# Patient Record
Sex: Female | Born: 1955
Health system: Southern US, Community
[De-identification: ages and names within clinical notes are randomized; demographics above are authoritative.]

## PROBLEM LIST (undated history)

## (undated) DIAGNOSIS — I10 Essential (primary) hypertension: Secondary | ICD-10-CM

## (undated) DIAGNOSIS — N6092 Unspecified benign mammary dysplasia of left breast: Secondary | ICD-10-CM

## (undated) DIAGNOSIS — M8580 Other specified disorders of bone density and structure, unspecified site: Secondary | ICD-10-CM

## (undated) DIAGNOSIS — K760 Fatty (change of) liver, not elsewhere classified: Secondary | ICD-10-CM

## (undated) DIAGNOSIS — N952 Postmenopausal atrophic vaginitis: Secondary | ICD-10-CM

## (undated) HISTORY — DX: Essential (primary) hypertension: I10

## (undated) HISTORY — DX: Fatty (change of) liver, not elsewhere classified: K76.0

## (undated) HISTORY — DX: Unspecified benign mammary dysplasia of left breast: N60.92

## (undated) HISTORY — PX: OTHER SURGICAL HISTORY: SHX169

## (undated) HISTORY — PX: COLONOSCOPY: SHX174

## (undated) HISTORY — DX: Postmenopausal atrophic vaginitis: N95.2

## (undated) HISTORY — DX: Other specified disorders of bone density and structure, unspecified site: M85.80

---
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
## 2005-11-15 HISTORY — PX: BREAST BIOPSY: SHX20

## 2009-04-03 LAB — HM MAMMOGRAPHY

## 2013-04-08 HISTORY — PX: BREAST EXCISIONAL BIOPSY: SUR124

## 2013-05-17 ENCOUNTER — Ambulatory Visit: Payer: Self-pay | Admitting: Family Medicine

## 2013-05-24 LAB — TSH: TSH: 3 u[IU]/mL (ref <=5.90)

## 2013-05-25 ENCOUNTER — Ambulatory Visit: Payer: Self-pay | Admitting: General Surgery

## 2013-06-16 ENCOUNTER — Encounter: Payer: Self-pay | Admitting: General Surgery

## 2013-06-16 ENCOUNTER — Ambulatory Visit (INDEPENDENT_AMBULATORY_CARE_PROVIDER_SITE_OTHER): Payer: Federal, State, Local not specified - PPO | Admitting: General Surgery

## 2013-06-16 VITALS — BP 130/78 | HR 74 | Resp 14 | Ht 63.0 in | Wt 144.0 lb

## 2013-06-16 DIAGNOSIS — R928 Other abnormal and inconclusive findings on diagnostic imaging of breast: Secondary | ICD-10-CM

## 2013-06-16 DIAGNOSIS — R921 Mammographic calcification found on diagnostic imaging of breast: Secondary | ICD-10-CM

## 2013-06-16 DIAGNOSIS — N63 Unspecified lump in unspecified breast: Secondary | ICD-10-CM

## 2013-06-16 NOTE — Patient Instructions (Addendum)
Patient to be scheduled for a left breast biopsy.  Continue self breast exams. Call office for any new breast issues or concerns.   Stereotactic Breast Biopsy  A stereotactic breast biopsy is a procedure in which mammography is used in the collection of a sample of breast tissue. Mammography is a type of X-ray exam of the breasts that produces an image called a mammogram. The mammogram allows your health care provider to precisely locate the area of the breast from which a tissue sample will be taken. The tissue is then examined under a microscope to see if cancerous cells are present. A breast biopsy is done when:   A lump, abnormality, or mass is seen in the breast on a breast X-ray (mammogram).   Small calcium deposits (calcifications) are seen in the breast.   The shape or appearance of the breasts changes.   The shape or appearance of the nipples changes. You may have unusual or bloody discharge coming from the nipples, or you may have crusting, retraction, or dimpling of the nipples. A breast biopsy can indicate if you need surgery or other treatment.  LET [facility] CARE PROVIDER KNOW ABOUT:  Any allergies you have.  All medicines you are taking, including vitamins, herbs, eye drops, creams, and over-the-counter medicines.  Previous problems you or members of your family have had with the use of anesthetics.  Any blood disorders you have.  Previous surgeries you have had.  Medical conditions you have. RISKS AND COMPLICATIONS Generally, stereotactic breast biopsy is a safe procedure. However, as with any procedure, complications can occur. Possible complications include:  Infection at the needle-insertion site.   Bleeding or bruising after surgery.  The breast may become altered or deformed as a result of the procedure.  The needle may go through the chest wall into the lung area.  BEFORE THE PROCEDURE  Wear a supportive bra to the procedure.  You will be asked to  remove jewelry, dentures, eyeglasses, metal objects, or clothing that might interfere with the X-ray images. You may want to leave some of these objects at home.  Arrange for someone to drive you home after the procedure if desired. PROCEDURE  A stereotactic breast biopsy is done while you are awake. During the procedure, relax as much as possible. Let your health care provider know if you are uncomfortable, anxious, or in pain. Usually, the only discomfort felt during the procedure is caused by staying in one position for the length of the procedure. This discomfort can be reduced by carefully placed cushions. Most of the time the biopsy is done using a table with openings on it. You will be asked to lie facedown on the table and place your breasts through the openings. Your breast is compressed between metal plates to get good X-ray images. Your skin will be cleaned, and a numbing medicine (local anesthetic) will be injected. A small cut (incision) will be made in your breast. The tip of the biopsy needle will be directed through the incision. Several small pieces of suspicious tissue will be taken. Then, a final set of X-ray images will be obtained. If they show that the suspicious tissue has been mostly or completely removed, a small clip will be left at the biopsy site. This is done so that the biopsy site can be easily located if the results of the biopsy show that the tissue is cancerous.  After the procedure, the incision will be stitched (sutured) or taped and covered with a  bandage (dressing). Your health care provider may apply a pressure dressing and an ice pack to prevent bleeding and swelling in the breast.  A stereotactic breast biopsy can take 30 minutes or more. AFTER THE PROCEDURE  If you are doing well and have no problems, you will be allowed to go home.  Document Released: 12/22/2002 Document Revised: 11/25/2012 Document Reviewed: 10/22/2012 [facility] Patient Information 2014  [place], [place].  Patient has been scheduled for a left breast stereotactic biopsy at [facility] for 06-21-13 at 4 pm. She will check-in at the [facility] at 3:30 pm. This patient is aware of date, time, and instructions. Patient verbalizes understanding.
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
## 2013-06-19 DIAGNOSIS — R921 Mammographic calcification found on diagnostic imaging of breast: Secondary | ICD-10-CM | POA: Insufficient documentation

## 2013-06-19 DIAGNOSIS — N63 Unspecified lump in unspecified breast: Secondary | ICD-10-CM | POA: Insufficient documentation

## 2013-06-21 ENCOUNTER — Ambulatory Visit: Payer: Self-pay | Admitting: General Surgery

## 2013-06-21 DIAGNOSIS — N63 Unspecified lump in unspecified breast: Secondary | ICD-10-CM

## 2013-06-21 DIAGNOSIS — R920 Mammographic microcalcification found on diagnostic imaging of breast: Secondary | ICD-10-CM

## 2013-06-23 ENCOUNTER — Telehealth: Payer: Self-pay | Admitting: General Surgery

## 2013-06-23 LAB — PATHOLOGY REPORT

## 2013-06-23 NOTE — Telephone Encounter (Signed)
Notified of biopsy results. Nodule suspected as papilloma with some focal atypia. As peripheral papillomas can harbor more aggressive lesions, surgical excision of the area has been recommended.  The area of diffuse calcifications suggested an intraductal papilloma with no atypia.  Patient reports moderate pain yesterday, markedly benefited by ice application. Less discomfort today.  Will arrange for brief OV and excision in the near future.

## 2013-06-24 ENCOUNTER — Encounter: Payer: Self-pay | Admitting: General Surgery

## 2013-06-24 ENCOUNTER — Telehealth: Payer: Self-pay | Admitting: *Deleted

## 2013-06-24 NOTE — Telephone Encounter (Signed)
Message copied by Dominga Ferry on Thu Jun 24, 2013  9:41 AM ------      Message from: Stuart, Forest Gleason      Created: Wed Jun 23, 2013  5:48 PM       Please contact patient to arrange for a brief OV to review biopsy results and schedule her for an open left breast biopsy when convenient for her. Thanks. ------

## 2013-06-24 NOTE — Telephone Encounter (Signed)
Patient's surgery has been scheduled for 07-02-13 at [facility] She will come in for a pre-op visit on 06-30-13 (she was offered Monday,06-28-13, but could not come that day). Phone interview scheduled for 07-01-13.

## 2013-06-28 ENCOUNTER — Ambulatory Visit: Payer: Self-pay | Admitting: Gastroenterology

## 2013-06-28 LAB — HM COLONOSCOPY

## 2013-06-29 ENCOUNTER — Ambulatory Visit: Payer: Federal, State, Local not specified - PPO

## 2013-06-30 ENCOUNTER — Encounter: Payer: Self-pay | Admitting: General Surgery

## 2013-06-30 ENCOUNTER — Ambulatory Visit (INDEPENDENT_AMBULATORY_CARE_PROVIDER_SITE_OTHER): Payer: Federal, State, Local not specified - PPO | Admitting: General Surgery

## 2013-06-30 ENCOUNTER — Other Ambulatory Visit: Payer: Federal, State, Local not specified - PPO

## 2013-06-30 VITALS — BP 136/82 | HR 80 | Resp 12 | Ht 63.0 in | Wt 140.0 lb

## 2013-06-30 DIAGNOSIS — N63 Unspecified lump in unspecified breast: Secondary | ICD-10-CM

## 2013-06-30 DIAGNOSIS — D249 Benign neoplasm of unspecified breast: Secondary | ICD-10-CM

## 2013-06-30 NOTE — Patient Instructions (Signed)
Scheduled biopsy on 07/01/13 at [facility]

## 2013-06-30 NOTE — Progress Notes (Signed)
Patient ID: Alyssa Thornton, female   DOB: [birth year], 58 y.o.   MRN: [id number]  Chief Complaint  Patient presents with  . Pre-op Exam    left breast biopsy    HPI Alyssa Thornton is a 58 y.o. female here today for her pre op left breast biopsy scheduled for 07/02/13. The patient had undergone stereotactic biopsy of a new lead identified nodular area in the upper-outer quadrant of the left breast as well as a area of increased calcifications in the upper-outer quadrant, central area of the breast. The former showed papillary neoplasm with atypia, the latter showed papillary changes without atypia. Excision has been recommended to confirm no additional pathology is overlooked.  The patient reports much less bruising with her recent stereotactic biopsy than in the past. HPI  Past Medical History  Diagnosis Date  . Fatty liver   . Breast mass   . Hypertension     Past Surgical History  Procedure Laterality Date  . Breast biopsy Left 2007  . Colonoscopy  2007-2008?    Family History  Problem Relation Age of Onset  . Heart disease Father   . Cancer Sister     bone marrow cancer    Social History History  Substance Use Topics  . Smoking status: Never Smoker   . Smokeless tobacco: Not on file  . Alcohol Use: No    Allergies  Allergen Reactions  . Diclofenac Sodium Diarrhea  . Tramadol Diarrhea    Current Outpatient Prescriptions  Medication Sig Dispense Refill  . Cholecalciferol (VITAMIN D-3 PO) Take 4,000 Int'l Units by mouth 4 (four) times daily.      . fluticasone (FLONASE) 50 MCG/ACT nasal spray Place 1 spray into both nostrils daily as needed.      . metroNIDAZOLE (METROGEL) 1 % gel Apply 1 application topically daily.      . Multiple Vitamin (MULTIVITAMIN) tablet Take 1 tablet by mouth daily.      Marland Kitchen PREMARIN vaginal cream Place 1 Applicatorful vaginally 2 (two) times a week.      . quinapril-hydrochlorothiazide (ACCURETIC) 10-12.5 MG per tablet Take 1 tablet by mouth  daily.      . vitamin C (ASCORBIC ACID) 500 MG tablet Take 500 mg by mouth daily.       No current facility-administered medications for this visit.    Review of Systems Review of Systems  Constitutional: Negative.   Respiratory: Negative.   Cardiovascular: Negative.     Blood pressure 136/82, pulse 80, resp. rate 12, height 5\' 3"  (1.6 m), weight 140 lb (63.504 kg).  Physical Exam Physical Exam  Constitutional: She is oriented to person, place, and time. She appears well-developed and well-nourished.  Eyes: Conjunctivae are normal.  Neck: Neck supple.  Cardiovascular: Normal rate, regular rhythm and normal heart sounds.   Pulmonary/Chest: Effort normal and breath sounds normal.    Lymphadenopathy:    She has no cervical adenopathy.  Neurological: She is alert and oriented to person, place, and time.  Skin: Skin is warm and dry.    Data Reviewed The nodular area in the upper outer quadrant of the left breast showed a papillary neoplasm with atypia. The area diffuse calcification showed papillary neoplasm without atypia. Ductal hyperplasia and sclerosing adenosis was also identified.  Ultrasound examination of the breast showed the biopsy cavities to be poorly visualized. At the 12:00 position 8 cm from the nipple the area of calcifications biopsied appears to be a 0.4 x 0.6 x 0.6 cm  hypoechoic area with dense shadowing.  The area of nodular excision in the upper outer quadrant the breast may be represented by a hypoechoic area at the 1:00 position, 12 cm from the nipple measuring 0.5 x 1.0 x 1.0 cm. These are fairly ill-defined.  Review of the post biopsy mammogram shows the clips previously placed (TOPHAT for the nodule, cylinder for the calcifications) to be about 2.5 cm apart. These are in the posterior third of the breast. To minimize volume resection, needle localization will be of benefit   Assessment    Papilloma with atypia left breast.    Plan    Indications for  wide excision to confirm no additional pathology was reviewed. We'll proceed with plans for surgery on 07/02/2048    PCP: Etheleen Mayhew 07/01/2013, 10:04 AM

## 2013-07-01 ENCOUNTER — Other Ambulatory Visit: Payer: Self-pay | Admitting: General Surgery

## 2013-07-01 DIAGNOSIS — D249 Benign neoplasm of unspecified breast: Secondary | ICD-10-CM | POA: Insufficient documentation

## 2013-07-01 DIAGNOSIS — N63 Unspecified lump in unspecified breast: Secondary | ICD-10-CM

## 2013-07-02 ENCOUNTER — Ambulatory Visit: Payer: Self-pay | Admitting: General Surgery

## 2013-07-02 DIAGNOSIS — N6092 Unspecified benign mammary dysplasia of left breast: Secondary | ICD-10-CM

## 2013-07-02 DIAGNOSIS — N6089 Other benign mammary dysplasias of unspecified breast: Secondary | ICD-10-CM

## 2013-07-02 HISTORY — DX: Unspecified benign mammary dysplasia of left breast: N60.92

## 2013-07-02 HISTORY — PX: BREAST BIOPSY: SHX20

## 2013-07-05 ENCOUNTER — Encounter: Payer: Self-pay | Admitting: General Surgery

## 2013-07-06 ENCOUNTER — Ambulatory Visit: Payer: Self-pay | Admitting: General Surgery

## 2013-07-07 ENCOUNTER — Telehealth: Payer: Self-pay | Admitting: *Deleted

## 2013-07-07 LAB — PATHOLOGY REPORT

## 2013-07-07 NOTE — Telephone Encounter (Signed)
Message copied by Carson Myrtle on Wed Jul 07, 2013  4:01 PM ------      Message from: [place], Forest Gleason      Created: Wed Jul 07, 2013  2:56 PM       Please let the patient know that the final report was fine. No cancer.  Will review in detail at follow up appt. Thanks. ------

## 2013-07-07 NOTE — Telephone Encounter (Signed)
Notified patient as instructed, patient pleased. Discussed follow-up appointments, 07-08-13, patient agrees

## 2013-07-08 ENCOUNTER — Encounter: Payer: Self-pay | Admitting: General Surgery

## 2013-07-08 ENCOUNTER — Ambulatory Visit: Payer: Federal, State, Local not specified - PPO | Admitting: General Surgery

## 2013-07-08 ENCOUNTER — Ambulatory Visit (INDEPENDENT_AMBULATORY_CARE_PROVIDER_SITE_OTHER): Payer: Federal, State, Local not specified - PPO | Admitting: General Surgery

## 2013-07-08 VITALS — BP 128/70 | HR 68 | Resp 12 | Ht 63.0 in | Wt 140.0 lb

## 2013-07-08 DIAGNOSIS — D249 Benign neoplasm of unspecified breast: Secondary | ICD-10-CM

## 2013-07-08 DIAGNOSIS — N62 Hypertrophy of breast: Secondary | ICD-10-CM

## 2013-07-08 DIAGNOSIS — N6092 Unspecified benign mammary dysplasia of left breast: Secondary | ICD-10-CM

## 2013-07-08 NOTE — Progress Notes (Signed)
Patient ID: Alyssa Thornton, female   DOB: October 20, 1955, 58 y.o.   MRN: [id number]  Chief Complaint  Patient presents with  . Routine Post Op    left breast open biopsy    HPI Alyssa Thornton is a 58 y.o. female.  Here today for follow up left breast open biopsy done 07-02-13. States she is still "sore".   HPI  Past Medical History  Diagnosis Date  . Fatty liver   . Breast mass   . Hypertension   . Atypical hyperplasia of left breast July 02, 2013    Left breast papilloma    Past Surgical History  Procedure Laterality Date  . Colonoscopy  2007-2008, 2015    Dr Taevin Mcferran/Dr Allen Norris  . Breast biopsy Left 2007  . Breast biopsy Left July 02, 2013    left breast papilloma with focal atypia    Family History  Problem Relation Age of Onset  . Heart disease Father   . Cancer Sister     bone marrow cancer  . Cancer - Ovarian Maternal Grandmother     90    Social History History  Substance Use Topics  . Smoking status: Never Smoker   . Smokeless tobacco: Not on file  . Alcohol Use: No    Allergies  Allergen Reactions  . Diclofenac Sodium Diarrhea  . Tramadol Diarrhea    Current Outpatient Prescriptions  Medication Sig Dispense Refill  . Cholecalciferol (VITAMIN D-3 PO) Take 4,000 Int'l Units by mouth 4 (four) times daily.      . fluticasone (FLONASE) 50 MCG/ACT nasal spray Place 1 spray into both nostrils daily as needed.      . metroNIDAZOLE (METROGEL) 1 % gel Apply 1 application topically daily.      . Multiple Vitamin (MULTIVITAMIN) tablet Take 1 tablet by mouth daily.      Marland Kitchen PREMARIN vaginal cream Place 1 Applicatorful vaginally 2 (two) times a week.      . quinapril-hydrochlorothiazide (ACCURETIC) 10-12.5 MG per tablet Take 1 tablet by mouth daily.      . vitamin C (ASCORBIC ACID) 500 MG tablet Take 500 mg by mouth daily.       No current facility-administered medications for this visit.    Review of Systems Review of Systems  Blood pressure 128/70, pulse 68,  resp. rate 12, height 5\' 3"  (1.6 m), weight 140 lb (63.504 kg).  Physical Exam Physical Exam  Constitutional: She is oriented to person, place, and time. She appears well-developed and well-nourished.  Pulmonary/Chest:  Incision left breast healing well with some bruising  Neurological: She is alert and oriented to person, place, and time.  Skin: Skin is warm and dry.    Data Reviewed Complex sclerosing lesion corresponded to area of calcifications in the breast anterior and medial to the nodular area which showed a papilloma with focal atypia. All margins clear.   Baker Janus model risk calculation: 4.1% risk in 5 years, 20.6% risk lifetime. Candidate for chemoprevention.  Assessment    Left breast papilloma with focal atypia, resected to negative margins.     Plan    The patient is doing well with healing in showing good volume preservation of the left breast.  Indication for antiestrogen therapy was reviewed. She reports that she was diagnosed with fatty liver for which evaluation was completed by Alyssa Thornton, M.D. Earlier this year. We'll need to review her records to determine if this is a contraindication to tamoxifen therapy. She'll be contacted with this review  is complete.         PCP: Etheleen Mayhew 07/10/2013, 6:28 AM

## 2013-07-08 NOTE — Patient Instructions (Signed)
Continue self breast exams. Call office for any new breast issues or concerns. 

## 2013-07-10 ENCOUNTER — Encounter: Payer: Self-pay | Admitting: General Surgery

## 2013-07-10 DIAGNOSIS — N6092 Unspecified benign mammary dysplasia of left breast: Secondary | ICD-10-CM | POA: Insufficient documentation

## 2013-07-20 ENCOUNTER — Ambulatory Visit: Payer: Federal, State, Local not specified - PPO | Admitting: General Surgery

## 2013-07-29 ENCOUNTER — Encounter: Payer: Self-pay | Admitting: General Surgery

## 2013-08-16 ENCOUNTER — Ambulatory Visit (INDEPENDENT_AMBULATORY_CARE_PROVIDER_SITE_OTHER): Payer: Federal, State, Local not specified - PPO | Admitting: General Surgery

## 2013-08-16 ENCOUNTER — Encounter: Payer: Self-pay | Admitting: General Surgery

## 2013-08-16 VITALS — BP 120/68 | HR 70 | Resp 12 | Ht 63.0 in | Wt 136.0 lb

## 2013-08-16 DIAGNOSIS — D249 Benign neoplasm of unspecified breast: Secondary | ICD-10-CM

## 2013-08-16 DIAGNOSIS — K7689 Other specified diseases of liver: Secondary | ICD-10-CM

## 2013-08-16 MED ORDER — TAMOXIFEN CITRATE 20 MG PO TABS: 20.0000 mg | ORAL_TABLET | Freq: Every day | ORAL | Status: DC

## 2013-08-16 NOTE — Patient Instructions (Signed)
Patient to take a baby aspiration daily. Discussed Tamoxifen. Patient to return in 3 months. Patient to call us in a month to let us know how you are doing on the  Tamoxifen.

## 2013-08-16 NOTE — Progress Notes (Signed)
Patient ID: Alyssa Thornton, female   DOB: 01-05-1956, 58 y.o.   MRN: [id number]  Chief Complaint  Patient presents with  . Follow-up    breast biopsy    HPI Alyssa Thornton is a 58 y.o. Here today for follow up left breast open biopsy done 07-02-13. Patient states she is doing well.   HPI  Past Medical History  Diagnosis Date  . Fatty liver   . Breast mass   . Hypertension   . Atypical hyperplasia of left breast July 02, 2013    Left breast papilloma    Past Surgical History  Procedure Laterality Date  . Colonoscopy  2007-2008, 2015    Dr Alyssa Thornton/Dr Alyssa Thornton  . Breast biopsy Left 2007  . Breast biopsy Left July 02, 2013    left breast papilloma with focal atypia    Family History  Problem Relation Age of Onset  . Heart disease Father   . Cancer Sister     bone marrow cancer  . Cancer - Ovarian Maternal Grandmother     90    Social History History  Substance Use Topics  . Smoking status: Never Smoker   . Smokeless tobacco: Not on file  . Alcohol Use: No    Allergies  Allergen Reactions  . Diclofenac Sodium Diarrhea  . Tramadol Diarrhea    Current Outpatient Prescriptions  Medication Sig Dispense Refill  . Cholecalciferol (VITAMIN D-3 PO) Take 4,000 Int'l Units by mouth 4 (four) times daily.      . fluticasone (FLONASE) 50 MCG/ACT nasal spray Place 1 spray into both nostrils daily as needed.      . metroNIDAZOLE (METROGEL) 1 % gel Apply 1 application topically daily.      . Multiple Vitamin (MULTIVITAMIN) tablet Take 1 tablet by mouth daily.      Marland Kitchen PREMARIN vaginal cream Place 1 Applicatorful vaginally 2 (two) times a week.      . quinapril-hydrochlorothiazide (ACCURETIC) 10-12.5 MG per tablet Take 1 tablet by mouth daily.      . vitamin C (ASCORBIC ACID) 500 MG tablet Take 500 mg by mouth daily.      . tamoxifen (NOLVADEX) 20 MG tablet Take 1 tablet (20 mg total) by mouth daily.  30 tablet  11   No current facility-administered medications for this visit.     Review of Systems Review of Systems  Constitutional: Negative.   Respiratory: Negative.   Cardiovascular: Negative.     Blood pressure 120/68, pulse 70, resp. rate 12, height 5\' 3"  (1.6 m), weight 136 lb (61.689 kg).  Physical Exam Physical Exam  Constitutional: She is oriented to person, place, and time. She appears well-developed and well-nourished.  Eyes: Conjunctivae are normal.  Neck: Neck supple.  Cardiovascular: Normal rate, regular rhythm and normal heart sounds.   Pulmonary/Chest: Right breast exhibits no inverted nipple, no mass, no nipple discharge, no skin change and no tenderness. Left breast exhibits no inverted nipple, no mass, no nipple discharge, no skin change and no tenderness.  Left breast incision well healed in the upper outer quadrant.  There is scant palpable deformity in this area in spite of the 4 x 4 by 6 cm volume of tissue removed. Left nipple height is well preserved compared to the right. Perhaps 5 of lateral deviation of the nipple areolar complex on the left, notable only with the patient's attention to the area.  Lymphadenopathy:    She has no cervical adenopathy.    She has no axillary  adenopathy.  Neurological: She is alert and oriented to person, place, and time.  Skin: Skin is warm and dry.    Data Reviewed July 02, 2013: Complex sclerosing lesion corresponded to area of calcifications in the breast anterior and medial to the nodular area which showed a papilloma with focal atypia. All margins clear. Discussed w/ Dr. Luana Thornton from pathology.   Assessment    Breast atypia, candidate for chemotherapy prevention.     Plan    The patient's history of "fatty liver" was discussed informally [name], M.D. From gastroenterology. He saw no frank contraindication to the institution of tamoxifen therapy, but recommended a liver panel 6 weeks after initiation of therapy.  Indications for chemotherapy prevention was reviewed with the patient.  Risks associated with medications tamoxifen including DVT, uterine malignancy and vasomotor symptoms were reviewed.  The need to make use of the medication for least one month to determine her tolerance was emphasized.   She'll give a phone follow up in one month and liver function studies will be obtained in 6 weeks at the lack or facility most convenient for her.  We'll plan on a clinical exam in 6 months due to her concerns about breast symmetry, volume loss in the upper outer quadrant as well as slight lateral deviation of the nipple-areolar complex and loss of the left nipple height.   The possibility of plastic surgery assessment for "symmetry" was reviewed, but would be discouraged orally after surgical intervention.     PCP: Alyssa Thornton 08/16/2013, 7:42 PM

## 2013-09-20 ENCOUNTER — Telehealth: Payer: Self-pay

## 2013-09-20 DIAGNOSIS — K760 Fatty (change of) liver, not elsewhere classified: Secondary | ICD-10-CM

## 2013-09-20 NOTE — Telephone Encounter (Signed)
Patient called with update after taking her Tamoxifen for 30 days. She reports some red bumps that come up and go away, no itching or irritation with these. She also reports some night sweats but nothing too severe. She was asking if she needed any blood work done now that she has been on this medication for a month.

## 2013-09-20 NOTE — Telephone Encounter (Signed)
Please arrange for the patient to have a liver panel drawn as an outpatient the week of September 27, 2013.Marland Kitchen DX: Fatty liver disease. Thanks.        Spoke with patient and notified her of lab tests to be done. Patient is amendable to this. She will stop by to pick up the lab order and complete her labs the week of June 22nd.

## 2013-09-20 NOTE — Telephone Encounter (Signed)
Due to the previously identified findings suggestive of a fatty liver, early liver function testing had been suggested by the GI service. Arrangements will be made for a liver panel aid to be drawn the week of June 22.

## 2013-09-29 LAB — HEPATIC FUNCTION PANEL
ALT: 16 IU/L (ref 0–32)
AST: 17 IU/L (ref 0–40)
Albumin: 4.1 g/dL (ref 3.6–4.8)
Alkaline Phosphatase: 99 IU/L (ref 39–117)
Bilirubin, Direct: 0.21 mg/dL (ref 0.00–0.40)
Total Bilirubin: 0.8 mg/dL (ref 0.0–1.2)
Total Protein: 6 g/dL (ref 6.0–8.5)

## 2013-09-30 ENCOUNTER — Telehealth: Payer: Self-pay | Admitting: *Deleted

## 2013-09-30 NOTE — Telephone Encounter (Signed)
Message copied by Carson Myrtle on Thu Sep 30, 2013  3:36 PM ------      Message from: [place], [place]      Created: Thu Sep 30, 2013  1:37 PM       Please notify the patient and her recent liver function studies are entirely normal. She should continue her tamoxifen.      ----- Message -----         From: [facility] Lab Results In Interface         Sent: 09/29/2013   5:42 AM           To: Robert Bellow, MD                   ------

## 2013-09-30 NOTE — Telephone Encounter (Signed)
Notified patient as instructed, patient pleased. Discussed follow-up appointments, patient agrees  

## 2013-11-15 ENCOUNTER — Ambulatory Visit: Payer: Self-pay | Admitting: General Surgery

## 2013-11-17 ENCOUNTER — Ambulatory Visit (INDEPENDENT_AMBULATORY_CARE_PROVIDER_SITE_OTHER): Payer: Federal, State, Local not specified - PPO | Admitting: General Surgery

## 2013-11-17 ENCOUNTER — Encounter: Payer: Self-pay | Admitting: General Surgery

## 2013-11-17 VITALS — BP 130/76 | Ht 63.0 in | Wt 142.0 lb

## 2013-11-17 DIAGNOSIS — N6092 Unspecified benign mammary dysplasia of left breast: Secondary | ICD-10-CM

## 2013-11-17 DIAGNOSIS — N62 Hypertrophy of breast: Secondary | ICD-10-CM

## 2013-11-17 NOTE — Progress Notes (Signed)
Patient ID: Alyssa Thornton, female   DOB: 1955-09-14, 58 y.o.   MRN: [id number]  Chief Complaint  Patient presents with  . Follow-up    3 month follow up breast    HPI Alyssa Thornton is a 58 y.o. female who presents for a 3 month follow up of her left breast. She states she is still having occasional pain which is unchanged from before. The pain is not keep her from doing all that she wants to do. She occasionally experiences a stinging pain in the nipple. The pain is described as aching.    HPI  Past Medical History  Diagnosis Date  . Fatty liver   . Breast mass   . Hypertension   . Atypical hyperplasia of left breast July 02, 2013    Left breast papilloma    Past Surgical History  Procedure Laterality Date  . Colonoscopy  2007-2008, 2015    Dr Byrnett/Dr Allen Norris  . Breast biopsy Left 2007  . Breast biopsy Left July 02, 2013    left breast papilloma with focal atypia    Family History  Problem Relation Age of Onset  . Heart disease Father   . Cancer Sister     bone marrow cancer  . Cancer - Ovarian Maternal Grandmother     90    Social History History  Substance Use Topics  . Smoking status: Never Smoker   . Smokeless tobacco: Not on file  . Alcohol Use: No    Allergies  Allergen Reactions  . Diclofenac Sodium Diarrhea  . Tramadol Diarrhea    Current Outpatient Prescriptions  Medication Sig Dispense Refill  . aspirin 81 MG tablet Take 81 mg by mouth daily.      . Cholecalciferol (VITAMIN D-3 PO) Take 4,000 Int'l Units by mouth 4 (four) times daily.      Marland Kitchen FINACEA 15 % cream       . fluticasone (FLONASE) 50 MCG/ACT nasal spray Place 1 spray into both nostrils daily as needed.      . metroNIDAZOLE (METROGEL) 1 % gel Apply 1 application topically daily.      . Multiple Vitamin (MULTIVITAMIN) tablet Take 1 tablet by mouth daily.      Marland Kitchen PREMARIN vaginal cream Place 1 Applicatorful vaginally 2 (two) times a week.      . quinapril-hydrochlorothiazide (ACCURETIC)  10-12.5 MG per tablet Take 1 tablet by mouth daily.      . tamoxifen (NOLVADEX) 20 MG tablet Take 1 tablet (20 mg total) by mouth daily.  30 tablet  11  . vitamin C (ASCORBIC ACID) 500 MG tablet Take 500 mg by mouth daily.      . vitamin E (VITAMIN E) 400 UNIT capsule Take 400 Units by mouth daily.       No current facility-administered medications for this visit.    Review of Systems Review of Systems  Constitutional: Negative.   Respiratory: Negative.   Cardiovascular: Negative.     Blood pressure 130/76, height 5\' 3"  (1.6 m), weight 142 lb (64.411 kg).  Physical Exam Physical Exam  Constitutional: She is oriented to person, place, and time. She appears well-developed and well-nourished.  Neck: Neck supple. No thyromegaly present.  Cardiovascular: Normal rate, regular rhythm and normal heart sounds.   No murmur heard. Pulmonary/Chest: Effort normal and breath sounds normal. Right breast exhibits no inverted nipple, no mass, no nipple discharge, no skin change and no tenderness. Left breast exhibits no inverted nipple, no mass, no nipple  discharge, no skin change and no tenderness.  Abdominal: Soft. Normal appearance and bowel sounds are normal. There is no hepatosplenomegaly. There is no tenderness. No hernia.  Lymphadenopathy:    She has no cervical adenopathy.    She has no axillary adenopathy.  Neurological: She is alert and oriented to person, place, and time.  Skin: Skin is warm and dry.    Data Reviewed Liver function studies obtained 6 weeks after the initiation of tamoxifen therapy were normal.  Assessment    Benign breast exam status post excision of atypical ductal hyperplasia.     Plan    We reviewed the indications for antiestrogen therapy. We'll plan for a followup examination in October 2015 with bilateral diagnostic mammograms and repeat LFTs at that time.     PCP: Etheleen Mayhew 11/18/2013, 9:16 PM

## 2013-11-17 NOTE — Patient Instructions (Addendum)
Patient to return in October 2015 after having mammogram is done. Continue self breast exams. Call office for any new breast issues or concerns.

## 2013-12-22 ENCOUNTER — Other Ambulatory Visit: Payer: Self-pay

## 2013-12-22 DIAGNOSIS — N6092 Unspecified benign mammary dysplasia of left breast: Secondary | ICD-10-CM

## 2014-01-15 LAB — HEPATIC FUNCTION PANEL
ALT: 17 IU/L (ref 0–32)
AST: 19 IU/L (ref 0–40)
Albumin: 3.9 g/dL (ref 3.6–4.8)
Alkaline Phosphatase: 88 IU/L (ref 39–117)
Bilirubin, Direct: 0.18 mg/dL (ref 0.00–0.40)
Total Bilirubin: 0.8 mg/dL (ref 0.0–1.2)
Total Protein: 5.8 g/dL — ABNORMAL LOW (ref 6.0–8.5)

## 2014-01-17 ENCOUNTER — Ambulatory Visit (INDEPENDENT_AMBULATORY_CARE_PROVIDER_SITE_OTHER): Payer: Federal, State, Local not specified - PPO | Admitting: General Surgery

## 2014-01-17 ENCOUNTER — Encounter: Payer: Self-pay | Admitting: General Surgery

## 2014-01-17 VITALS — BP 110/62 | HR 68 | Resp 12 | Ht 63.0 in | Wt 149.0 lb

## 2014-01-17 DIAGNOSIS — N62 Hypertrophy of breast: Secondary | ICD-10-CM

## 2014-01-17 DIAGNOSIS — N6092 Unspecified benign mammary dysplasia of left breast: Secondary | ICD-10-CM

## 2014-01-17 LAB — HM PAP SMEAR: HM Pap smear: NEGATIVE

## 2014-01-17 NOTE — Progress Notes (Signed)
Patient ID: Alyssa Thornton, female   DOB: 1956-03-27, 58 y.o.   MRN: [id number]  Chief Complaint  Patient presents with  . Follow-up    mammogram    HPI Alyssa Thornton is a 58 y.o. female who presents for a breast evaluation. The most recent mammogram was done on 01/14/14 at [facility].  Patient does perform regular self breast checks and gets regular mammograms done.    HPI  Past Medical History  Diagnosis Date  . Fatty liver   . Breast mass   . Hypertension   . Atypical hyperplasia of left breast July 02, 2013    Left breast papilloma    Past Surgical History  Procedure Laterality Date  . Colonoscopy  2007-2008, 2015    Dr Byrnett/Dr Allen Norris  . Breast biopsy Left 2007  . Breast biopsy Left July 02, 2013    left breast papilloma with focal atypia    Family History  Problem Relation Age of Onset  . Heart disease Father   . Cancer Sister     bone marrow cancer  . Cancer - Ovarian Maternal Grandmother     90    Social History History  Substance Use Topics  . Smoking status: Never Smoker   . Smokeless tobacco: Never Used  . Alcohol Use: No    Allergies  Allergen Reactions  . Diclofenac Sodium Diarrhea  . Tramadol Diarrhea    Current Outpatient Prescriptions  Medication Sig Dispense Refill  . aspirin 81 MG tablet Take 81 mg by mouth daily.      . Cholecalciferol (VITAMIN D-3 PO) Take 4,000 Int'l Units by mouth 4 (four) times daily.      Marland Kitchen FINACEA 15 % cream       . fluticasone (FLONASE) 50 MCG/ACT nasal spray Place 1 spray into both nostrils daily as needed.      . metroNIDAZOLE (METROGEL) 1 % gel Apply 1 application topically daily.      . Multiple Vitamin (MULTIVITAMIN) tablet Take 1 tablet by mouth daily.      Marland Kitchen PREMARIN vaginal cream Place 1 Applicatorful vaginally 2 (two) times a week.      . quinapril-hydrochlorothiazide (ACCURETIC) 10-12.5 MG per tablet Take 1 tablet by mouth daily.      . tamoxifen (NOLVADEX) 20 MG tablet Take 1 tablet (20 mg total) by mouth  daily.  30 tablet  11  . vitamin C (ASCORBIC ACID) 500 MG tablet Take 500 mg by mouth daily.      . vitamin E (VITAMIN E) 400 UNIT capsule Take 400 Units by mouth daily.       No current facility-administered medications for this visit.    Review of Systems Review of Systems  Constitutional: Negative.   Respiratory: Negative.   Cardiovascular: Negative.     Blood pressure 110/62, pulse 68, resp. rate 12, height 5\' 3"  (1.6 m), weight 149 lb (67.586 kg).  Physical Exam Physical Exam  Constitutional: She is oriented to person, place, and time. She appears well-developed and well-nourished.  Eyes: Conjunctivae are normal. No scleral icterus.  Neck: Neck supple.  Cardiovascular: Normal rate, regular rhythm and normal heart sounds.   Pulmonary/Chest: Effort normal and breath sounds normal. Right breast exhibits no inverted nipple, no mass, no nipple discharge, no skin change and no tenderness. Left breast exhibits no inverted nipple, no mass, no nipple discharge, no skin change and no tenderness.    Left upper outer quadrant well healed scar.   Lymphadenopathy:  She has no cervical adenopathy.    She has no axillary adenopathy.  Neurological: She is alert and oriented to person, place, and time.  Skin: Skin is warm and dry.    Data Reviewed Liver function studies at one in 4 months post initiation of tamoxifen therapy have been normal.  Assessment    ADH left breast, no evidence of malignancy. Tolerating tamoxifen therapy without ill effect.     Plan    Will arrange for bilateral diagnostic mammograms in 12 months with an office visit to follow.      PCP: Etheleen Mayhew 01/18/2014, 8:50 PM

## 2014-01-17 NOTE — Patient Instructions (Signed)
Patient to return in one year bilateral diagnotic mammogram.

## 2014-02-07 ENCOUNTER — Encounter: Payer: Self-pay | Admitting: General Surgery

## 2014-04-25 LAB — LIPID PANEL
Cholesterol: 167 mg/dL (ref 0–200)
HDL: 70 mg/dL (ref 35–70)
LDL Cholesterol: 83 mg/dL
Triglycerides: 68 mg/dL (ref 40–160)

## 2014-04-25 LAB — BASIC METABOLIC PANEL
BUN: 15 mg/dL (ref 4–21)
Creatinine: 0.6 mg/dL (ref 0.5–1.1)
Glucose: 107 mg/dL
Potassium: 4.3 mmol/L (ref 3.4–5.3)
Sodium: 142 mmol/L (ref 137–147)

## 2014-04-25 LAB — CBC AND DIFFERENTIAL
HCT: 41 % (ref 36–46)
Hemoglobin: 14.7 g/dL (ref 12.0–16.0)
Platelets: 154 10*3/uL (ref 150–399)
WBC: 5 10^3/mL

## 2014-04-25 LAB — HEMOGLOBIN A1C: Hemoglobin A1C: 5.6

## 2014-04-25 LAB — HEPATIC FUNCTION PANEL
ALT: 20 U/L (ref 7–35)
AST: 19 U/L (ref 13–35)

## 2014-07-30 NOTE — Op Note (Signed)
PATIENT NAME:  Alyssa Thornton, Alyssa Thornton MR#:  [id number] DATE OF BIRTH:  26-Jun-1955  DATE OF PROCEDURE:  07/02/2013  PREOPERATIVE DIAGNOSES: 1.  Papillary neoplasm with atypia, left breast upper outer quadrant.  2. Papillary neoplasm without atypia, left breast.   POSTOPERATIVE DIAGNOSES:  1.  Papillary neoplasm with atypia, left breast upper outer quadrant.  2. Papillary neoplasm without atypia, left breast.   OPERATIVE PROCEDURE: Needle localization and wide excision of left breast papillary neoplasm with postprocedure mastoplasty under ultrasound guidance.   SURGEON: Hervey Ard, M.D.   ANESTHESIA: General under LMA by Dr. Myra Gianotti, Marcaine 0.5% with 1:200,000 units of epinephrine 30 mL local infiltration.   ESTIMATED BLOOD LOSS: Minimal.   CLINICAL NOTE: This 59 year old woman was recently noted with a change in her mammogram. Biopsy completed last week of 2 areas showed a papillary neoplasm with atypia and a second area of calcifications with papillary findings without atypia. Postbiopsy mammogram showed some remaining calcifications. The biopsy cavities were not clearly evident on intraoffice ultrasound, and needle localization was completed by Margarette Canada, M.D., from the radiology department this morning. The tip of the catheters were placed at both clips. Planned placement of a third clip was aborted when the patient suffered a vagal reaction.   DESCRIPTION OF PROCEDURE: The patient was brought to the operating room and received Kefzol intravenously. After general anesthesia was established, the wires were trimmed and the breast carefully prepped with ChloraPrep and draped. Ultrasound was used to identify the tip of the wires in the 2 o'clock position of the breast. A curvilinear incision in the upper outer quadrant was then made after the instillation of local anesthesia. The tissue involved with the residual calcifications and the nodular areas themselves was in the posterior aspect of the  breast. The skin incision was made sharply and the remaining dissection completed with electrocautery. The dissection was completed through the adipose tissue until the beginning of the breast parenchyma. At this point, the localizing wires were identified and brought into the field. An effort was then made to extend the tissue to be resected down an additional 2 cm and anterior to the original localizing wire to encompass the residual calcifications. A 3 x 4 x 5 cm block of tissue was removed, orientated and sent for specimen radiography which included the area of calcifications as well as both previously placed marking clips and the tips of both wires. It was sent to pathology for orientation and inking and permanent sections.   After achieving good hemostasis with electrocautery, the breast parenchyma was elevated off the underlying pectoralis fascia circumferentially. This was then approximated with interrupted 2-0 Vicryl figure-of-eight sutures. The superficial layers of the adipose tissue were then freed to remove a small ripple in the lower outer quadrant of the breast skin. Of note, on the resected specimen, the fascia of the pectoralis muscle was the deep margin.   The breast wound was then closed with additional layers of 2-0 Vicryl figure-of-eight sutures. The skin was closed with running 4-0 Vicryl subcuticular suture. Benzoin and Steri-Strips followed by fluff gauze, Kerlix and Ace wrap, were applied.   The patient tolerated the procedure well and was taken to the recovery room in stable condition.   ____________________________ Robert Bellow, MD jwb:gb D: 07/02/2013 16:10:50 ET T: 07/02/2013 22:10:46 ET JOB#: [id number]  cc: Robert Bellow, MD, <Dictator> Jerrell Belfast, MD Benjimen Kelley Amedeo Kinsman MD ELECTRONICALLY SIGNED 07/05/2013 11:20

## 2014-08-18 ENCOUNTER — Other Ambulatory Visit: Payer: Self-pay | Admitting: General Surgery

## 2014-10-22 ENCOUNTER — Other Ambulatory Visit: Payer: Self-pay | Admitting: Family Medicine

## 2014-10-24 ENCOUNTER — Telehealth: Payer: Self-pay | Admitting: Family Medicine

## 2014-10-24 ENCOUNTER — Other Ambulatory Visit: Payer: Self-pay | Admitting: Family Medicine

## 2014-10-24 MED ORDER — MONTELUKAST SODIUM 10 MG PO TABS: 10.0000 mg | ORAL_TABLET | Freq: Every day | ORAL | Status: DC

## 2014-10-24 NOTE — Telephone Encounter (Signed)
montelukast (SINGULAIR) 10 MG tablet REfill [facility]  Thanks TP

## 2014-10-24 NOTE — Telephone Encounter (Signed)
montelukast (SINGULAIR) 10 MG tablet Needs new refill  ?  [place]

## 2014-11-03 ENCOUNTER — Telehealth: Payer: Self-pay | Admitting: Family Medicine

## 2014-11-03 NOTE — Telephone Encounter (Signed)
I spoke with Dr. Rosanna Randy and he has asked that we let the patient know that he is not taking new patients.  Thanks  ED  [facility]  ED

## 2014-11-03 NOTE — Telephone Encounter (Signed)
Patient advised  ED 

## 2014-11-03 NOTE — Telephone Encounter (Signed)
This is a Dr. Venia Minks pt and would like to have her husband see Dr. Rosanna Randy. Husband needs a DOT Physical and even though we do not do them here he hopes to get Dr. Rosanna Randy to do his DOT threw the other clinic that he works with. Name: Alyssa Thornton. DOB: [birth year]. Current Meds: Amlodipine & Atenolol. Insurance [employer]. Can Dr. Rosanna Randy accept husband as a new pt? Pt stated that even if Dr. Rosanna Randy can not do the DOT Physical they would still like to become a pt of Dr. Alben Spittle. Thanks TNP

## 2015-01-18 ENCOUNTER — Ambulatory Visit: Payer: Federal, State, Local not specified - PPO | Admitting: General Surgery

## 2015-01-30 ENCOUNTER — Encounter: Payer: Self-pay | Admitting: General Surgery

## 2015-02-06 ENCOUNTER — Encounter: Payer: Self-pay | Admitting: General Surgery

## 2015-02-06 ENCOUNTER — Ambulatory Visit (INDEPENDENT_AMBULATORY_CARE_PROVIDER_SITE_OTHER): Payer: Federal, State, Local not specified - PPO | Admitting: General Surgery

## 2015-02-06 VITALS — BP 122/74 | HR 76 | Resp 12 | Ht 63.0 in | Wt 158.0 lb

## 2015-02-06 DIAGNOSIS — N62 Hypertrophy of breast: Secondary | ICD-10-CM | POA: Diagnosis not present

## 2015-02-06 DIAGNOSIS — N6092 Unspecified benign mammary dysplasia of left breast: Secondary | ICD-10-CM

## 2015-02-06 NOTE — Progress Notes (Signed)
Patient ID: Alyssa Thornton, female   DOB: 02/18/1956, 59 y.o.   MRN: [id number]  Chief Complaint  Patient presents with  . Follow-up    mammogram    HPI Alyssa Thornton is a 59 y.o. female who presents for a breast evaluation. The most recent mammogram was done on 01/27/15.  Patient does perform regular self breast checks and gets regular mammograms done.  Doing well on the Tamoxifen.  HPI  Past Medical History  Diagnosis Date  . Fatty liver   . Breast mass   . Hypertension   . Atypical hyperplasia of left breast July 02, 2013    Left breast papilloma    Past Surgical History  Procedure Laterality Date  . Colonoscopy  2007-2008, 2015    Dr Andreana Klingerman/Dr Allen Norris  . Breast biopsy Left 2007  . Breast biopsy Left July 02, 2013    left breast papilloma with focal atypia    Family History  Problem Relation Age of Onset  . Heart disease Father   . Cancer Sister     bone marrow cancer  . Cancer - Ovarian Maternal Grandmother     90    Social History Social History  Substance Use Topics  . Smoking status: Never Smoker   . Smokeless tobacco: Never Used  . Alcohol Use: No    Allergies  Allergen Reactions  . Diclofenac Sodium Diarrhea  . Tramadol Diarrhea    Current Outpatient Prescriptions  Medication Sig Dispense Refill  . aspirin 81 MG tablet Take 81 mg by mouth daily.    . Cholecalciferol (VITAMIN D-3 PO) Take 4,000 Int'l Units by mouth 4 (four) times daily.    Marland Kitchen FINACEA 15 % cream     . fluticasone (FLONASE) 50 MCG/ACT nasal spray Place 1 spray into both nostrils daily as needed.    . metroNIDAZOLE (METROGEL) 1 % gel Apply 1 application topically daily.    . montelukast (SINGULAIR) 10 MG tablet Take 1 tablet (10 mg total) by mouth daily. 30 tablet 4  . Multiple Vitamin (MULTIVITAMIN) tablet Take 1 tablet by mouth daily.    Marland Kitchen PREMARIN vaginal cream Place 1 Applicatorful vaginally 2 (two) times a week.    . quinapril-hydrochlorothiazide (ACCURETIC) 10-12.5 MG per  tablet Take 1 tablet by mouth daily.    . tamoxifen (NOLVADEX) 20 MG tablet TAKE ONE TABLET BY MOUTH EVERY DAY 30 tablet 8  . vitamin C (ASCORBIC ACID) 500 MG tablet Take 500 mg by mouth daily.    . vitamin E (VITAMIN E) 400 UNIT capsule Take 400 Units by mouth daily.     No current facility-administered medications for this visit.    Review of Systems Review of Systems  Respiratory: Negative.   Cardiovascular: Negative.   Genitourinary:       No vaginal spotting.    Blood pressure 122/74, pulse 76, resp. rate 12, height 5\' 3"  (1.6 m), weight 158 lb (71.668 kg).  Physical Exam Physical Exam  Constitutional: She is oriented to person, place, and time. She appears well-developed and well-nourished.  Eyes: Conjunctivae are normal. No scleral icterus.  Neck: Neck supple.  Cardiovascular: Normal rate, regular rhythm and normal heart sounds.   Pulmonary/Chest: Effort normal and breath sounds normal. Right breast exhibits no inverted nipple, no mass, no nipple discharge, no skin change and no tenderness. Left breast exhibits tenderness. Left breast exhibits no inverted nipple, no mass, no nipple discharge and no skin change.    Abdominal: Soft. Normal appearance and bowel  sounds are normal. There is no tenderness.  Lymphadenopathy:    She has no cervical adenopathy.    She has no axillary adenopathy.  Neurological: She is alert and oriented to person, place, and time.  Skin: Skin is warm and dry.    Data Reviewed Bilateral diagnostic mammograms completed at [facility] dated 01/27/2015 were reviewed. No interval change. BI-RADS-2.  Assessment    ADH, tolerating chemoprevention with tamoxifen well.  10 pound weight gain over the last 12 months.    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 Opportunities to modify caloric intake were reviewed.   Good tolerance of tamoxifen.         Patient will be asked to  return to the office in one year with a bilateral diagnotic mammogram. PCP:  Etheleen Mayhew 02/06/2015, 9:42 AM

## 2015-02-06 NOTE — Patient Instructions (Addendum)
Patient will be asked to return to the office in one year with a bilateral diagnotic  mammogram. 

## 2015-03-28 IMAGING — US ABDOMEN ULTRASOUND
1 series · 13 of 25 positions shown · non-contrast
Comparison: None.

CLINICAL DATA: Epigastric abdominal pain.

EXAM:
ULTRASOUND ABDOMEN COMPLETE

[Series 1: abdomen ultrasound · 0.27mm/px · 13 of 75 slices shown]
[im 1/75]
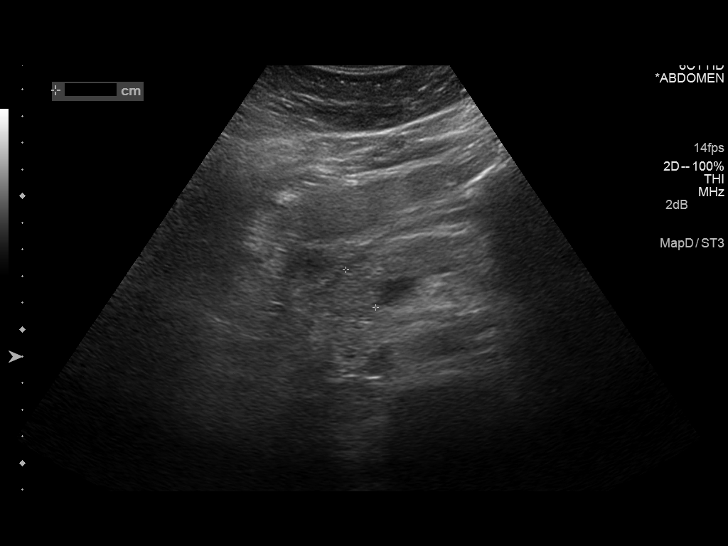
[im 7/75]
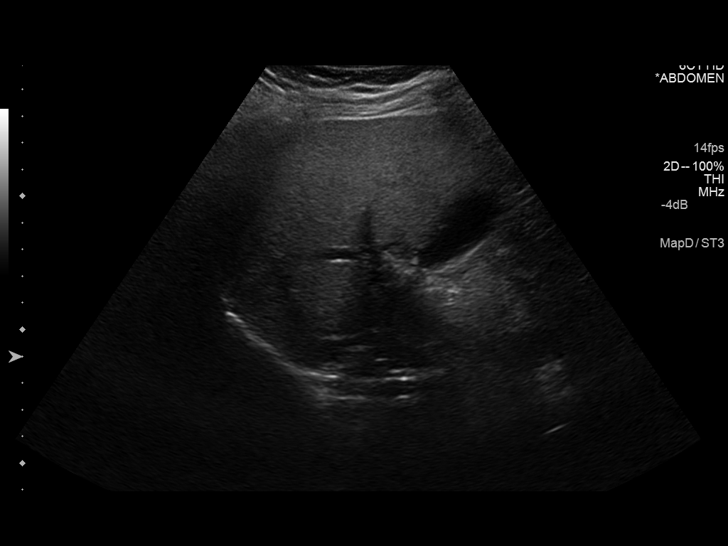
[im 13/75]
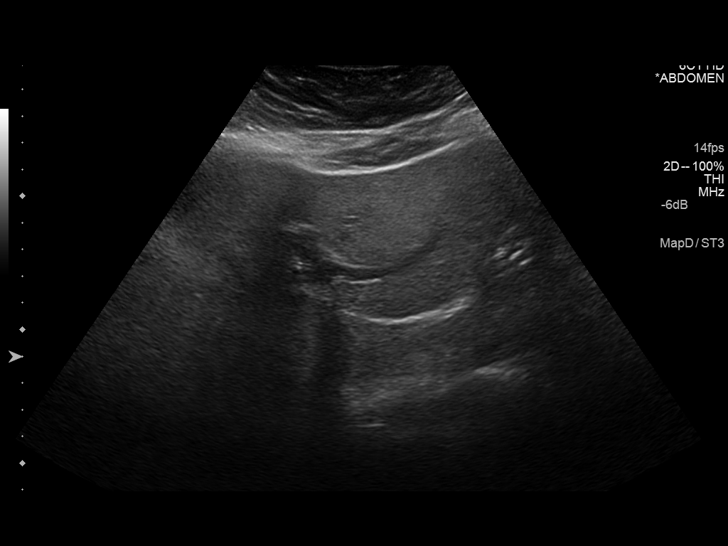
[im 19/75]
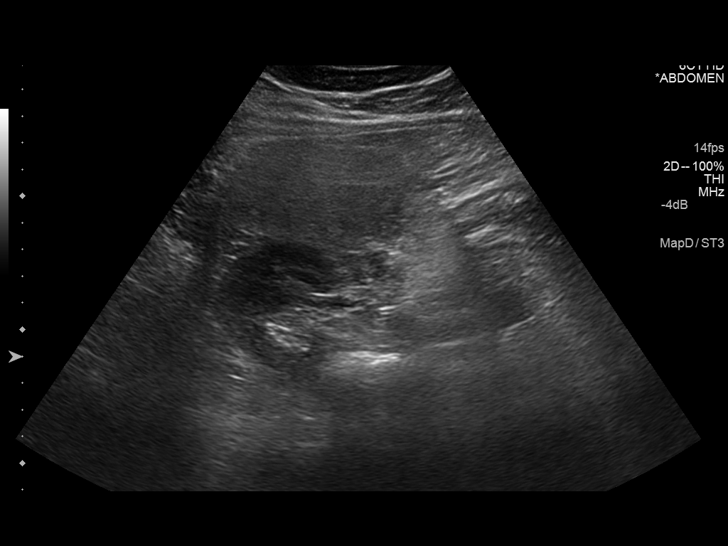
[im 25/75]
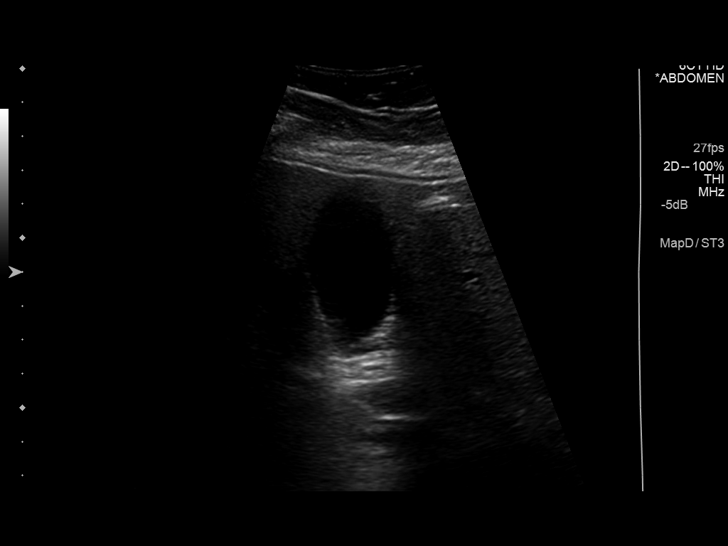
[im 31/75]
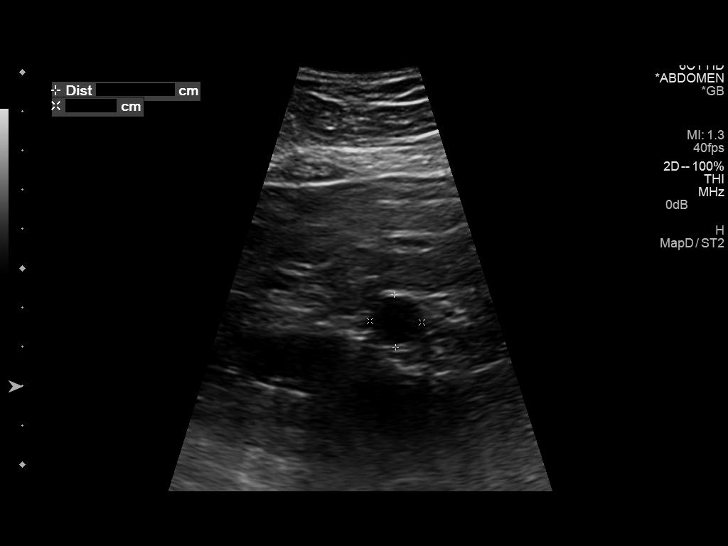
[im 38/75]
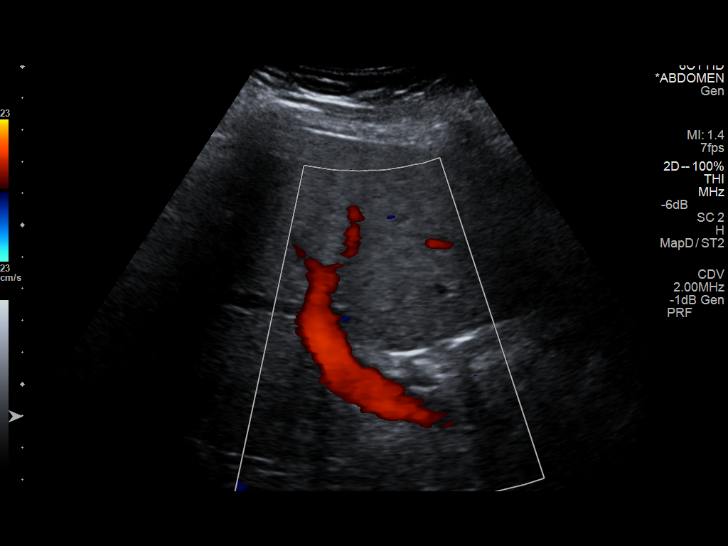
[im 44/75]
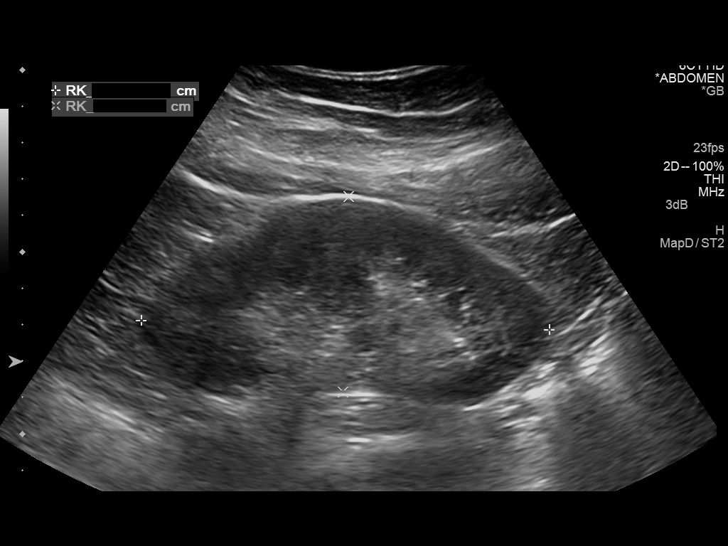
[im 50/75]
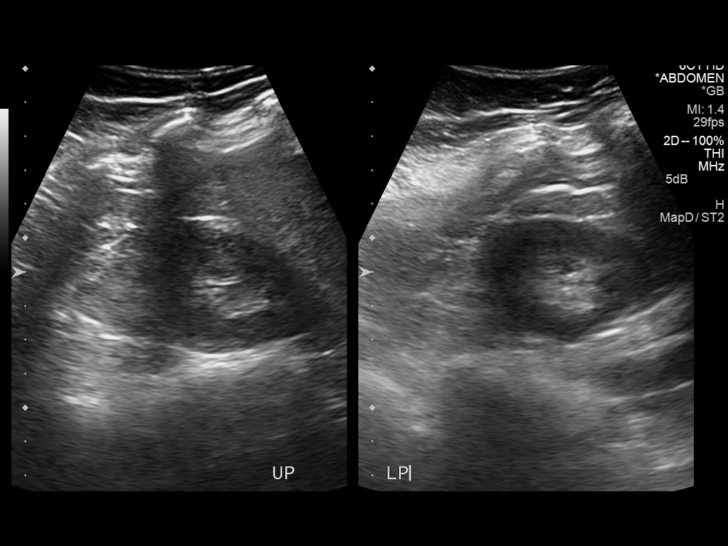
[im 56/75]
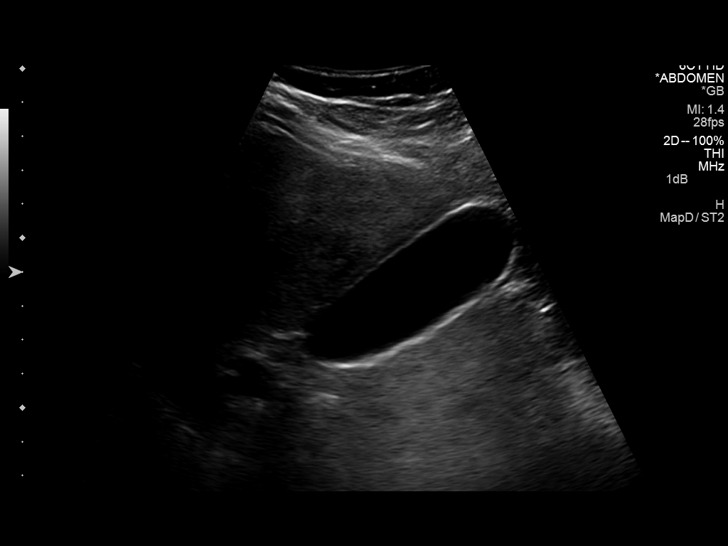
[im 62/75]
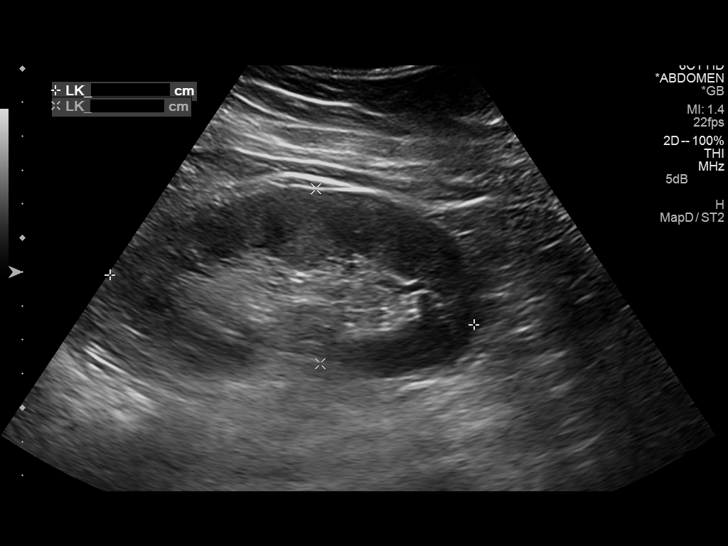
[im 68/75]
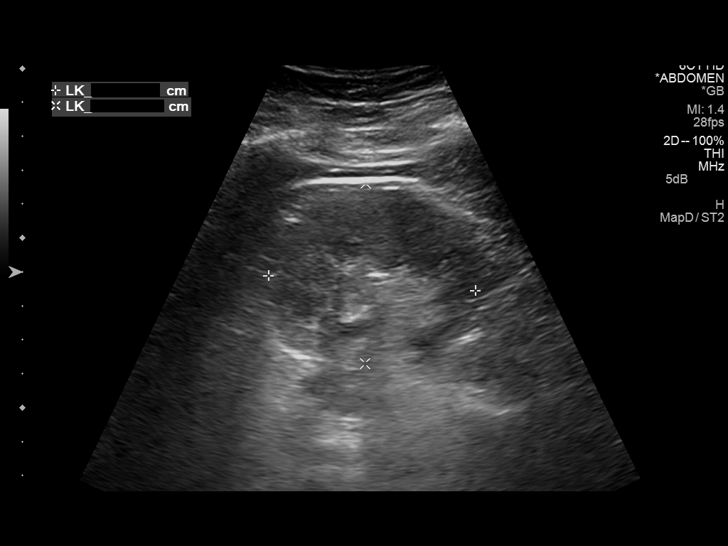
[im 75/75]
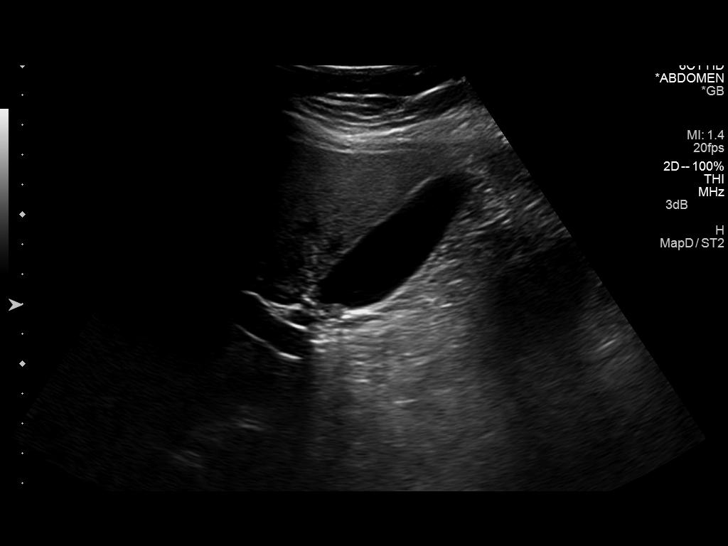

[13 of 25 positions shown; findings below may reference images not displayed]

FINDINGS: Gallbladder:

No shadowing gallstones or echogenic sludge. No gallbladder wall
thickening or pericholecystic fluid. Negative sonographic Murphy
sign according to the ultrasound technologist.

Common bile duct:

Diameter: Approximately 5 mm.

Liver:

Diffusely increased and coarsened echotexture without focal hepatic
parenchymal abnormality. Patent portal vein with hepatopetal flow.

IVC:

Patent.

Pancreas:

Normal size and echotexture without focal parenchymal abnormality.

Spleen:

Normal size and echotexture without focal parenchymal abnormality.

Right Kidney:

Length: Approximately 11.2 cm. No hydronephrosis. Well-preserved
cortex. No shadowing calculi. Normal parenchymal echotexture without
focal abnormalities.

Left Kidney:

Length: Approximately 10.8 cm. No hydronephrosis. Well-preserved
cortex. No shadowing calculi. Normal parenchymal echotexture without
focal abnormalities.

Abdominal aorta:

Normal in caliber throughout its visualized course in the abdomen
without evidence of significant atherosclerosis. Maximum diameter
1.8 cm.

Other findings:

None.
IMPRESSION: 1. Mild diffuse hepatic steatosis and/or hepatocellular disease. No
focal hepatic parenchymal abnormality.
2. Otherwise normal examination.

## 2015-03-31 DIAGNOSIS — J309 Allergic rhinitis, unspecified: Secondary | ICD-10-CM | POA: Insufficient documentation

## 2015-03-31 DIAGNOSIS — M722 Plantar fascial fibromatosis: Secondary | ICD-10-CM | POA: Insufficient documentation

## 2015-03-31 DIAGNOSIS — R609 Edema, unspecified: Secondary | ICD-10-CM | POA: Insufficient documentation

## 2015-03-31 DIAGNOSIS — E785 Hyperlipidemia, unspecified: Secondary | ICD-10-CM | POA: Insufficient documentation

## 2015-03-31 DIAGNOSIS — G43909 Migraine, unspecified, not intractable, without status migrainosus: Secondary | ICD-10-CM | POA: Insufficient documentation

## 2015-03-31 DIAGNOSIS — R079 Chest pain, unspecified: Secondary | ICD-10-CM | POA: Insufficient documentation

## 2015-03-31 DIAGNOSIS — R109 Unspecified abdominal pain: Secondary | ICD-10-CM | POA: Insufficient documentation

## 2015-03-31 DIAGNOSIS — K760 Fatty (change of) liver, not elsewhere classified: Secondary | ICD-10-CM | POA: Insufficient documentation

## 2015-03-31 DIAGNOSIS — I10 Essential (primary) hypertension: Secondary | ICD-10-CM | POA: Insufficient documentation

## 2015-04-05 ENCOUNTER — Ambulatory Visit (INDEPENDENT_AMBULATORY_CARE_PROVIDER_SITE_OTHER): Payer: Federal, State, Local not specified - PPO

## 2015-04-05 ENCOUNTER — Ambulatory Visit (INDEPENDENT_AMBULATORY_CARE_PROVIDER_SITE_OTHER): Payer: Federal, State, Local not specified - PPO | Admitting: Podiatry

## 2015-04-05 ENCOUNTER — Ambulatory Visit: Payer: Self-pay | Admitting: Family Medicine

## 2015-04-05 ENCOUNTER — Encounter: Payer: Self-pay | Admitting: Podiatry

## 2015-04-05 VITALS — BP 131/78 | HR 82 | Resp 16

## 2015-04-05 DIAGNOSIS — M722 Plantar fascial fibromatosis: Secondary | ICD-10-CM

## 2015-04-05 NOTE — Progress Notes (Signed)
She presents today for a chief complaint of heel pain left. She states that I really think I need a new set of orthotics.  Objective: Vital signs are stable alert and oriented 3. Pulses are palpable. Medial calcaneal tubercle is painful.  Assessment: Chronic proximal plantar fasciitis.  Plan: Dispensed myofascial brace. And she was scanned for a new set of orthotics.

## 2015-04-05 NOTE — Patient Instructions (Signed)

## 2015-04-19 ENCOUNTER — Telehealth: Payer: Self-pay | Admitting: *Deleted

## 2015-04-19 NOTE — Telephone Encounter (Signed)
Pt states she received a call cancelling her appt, but she doesn't think she can wait to that new appt time without an injection.  I transferred pt to schedulers to get an appt with Dr. Milinda Pointer.

## 2015-04-20 ENCOUNTER — Other Ambulatory Visit: Payer: Self-pay | Admitting: Family Medicine

## 2015-04-20 DIAGNOSIS — I10 Essential (primary) hypertension: Secondary | ICD-10-CM

## 2015-05-02 IMAGING — MG US BIOPSY BREAST CORE VACUUM ASSIST
2 series · 4 of 4 positions shown · non-contrast
Comparison: Previous exams.

CLINICAL DATA: 57-year-old female for wire localization prior to
left breast surgical excision for papillary lesions and atypia.

EXAM:
NEEDLE LOCALIZATION OF THE LEFT BREAST WITH MAMMO GUIDANCE

[L CC · left · 2 of 2 slices shown (1 of 2)]
[im 1/2]
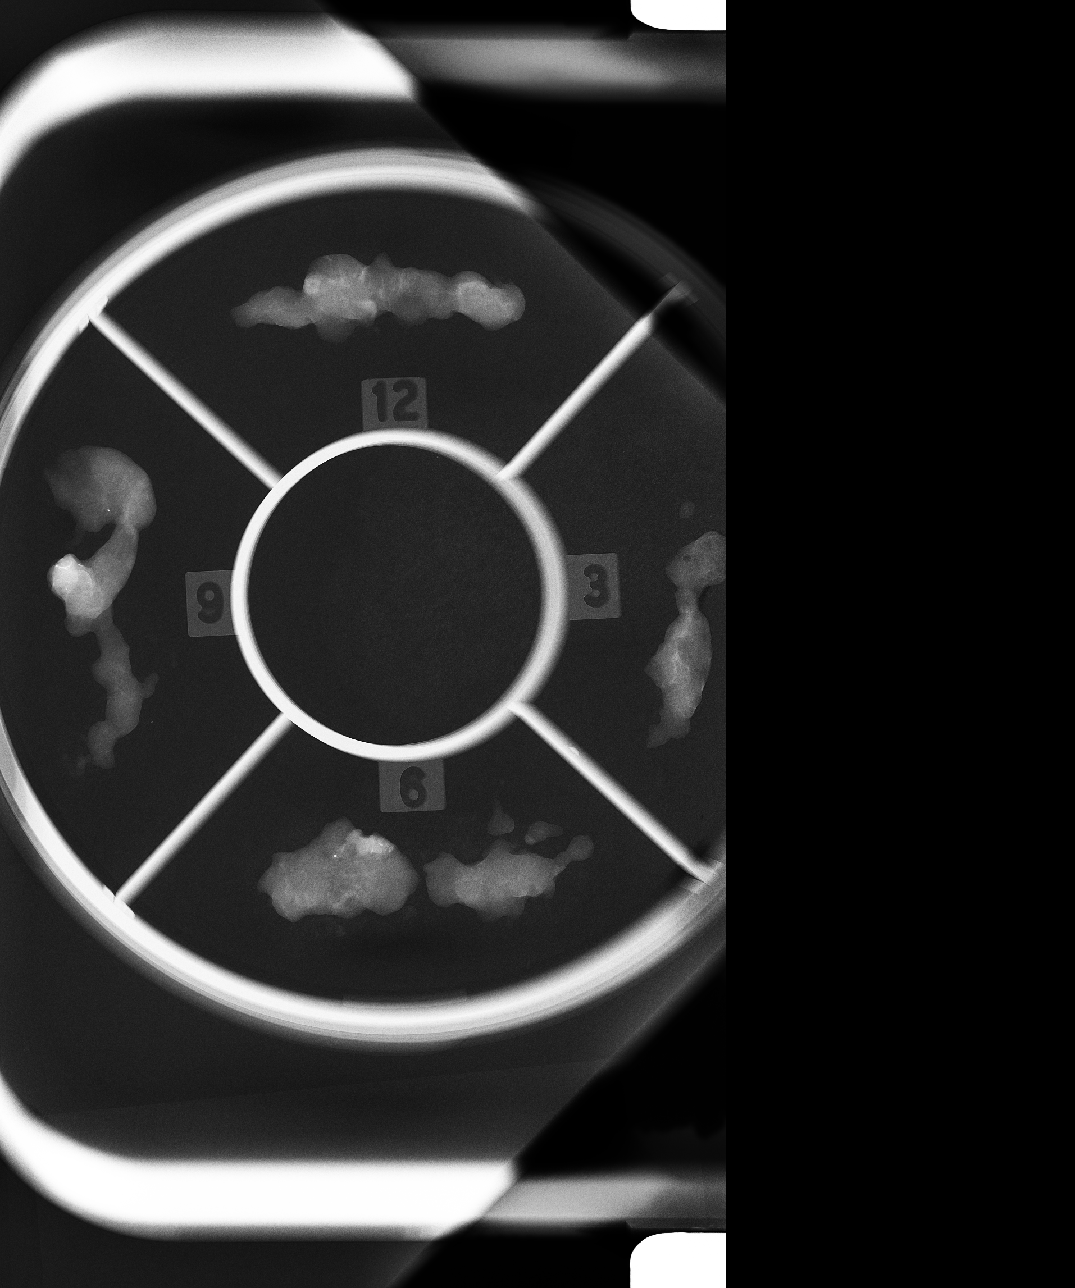
[im 2/2]
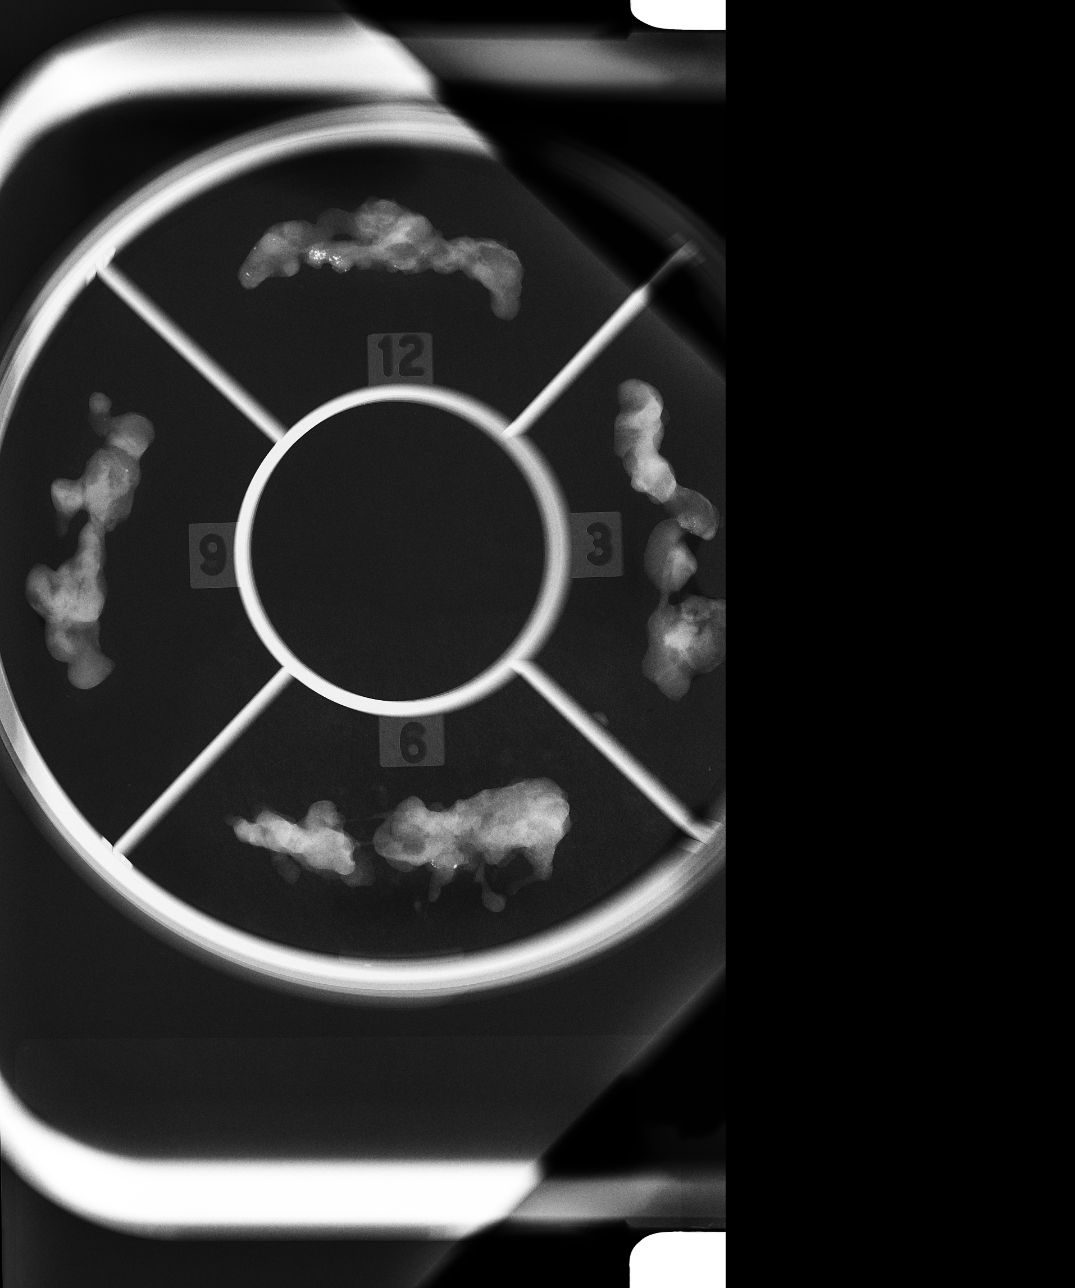

[L CC · left · 2 of 2 slices shown (2 of 2)]
[im 1/2]
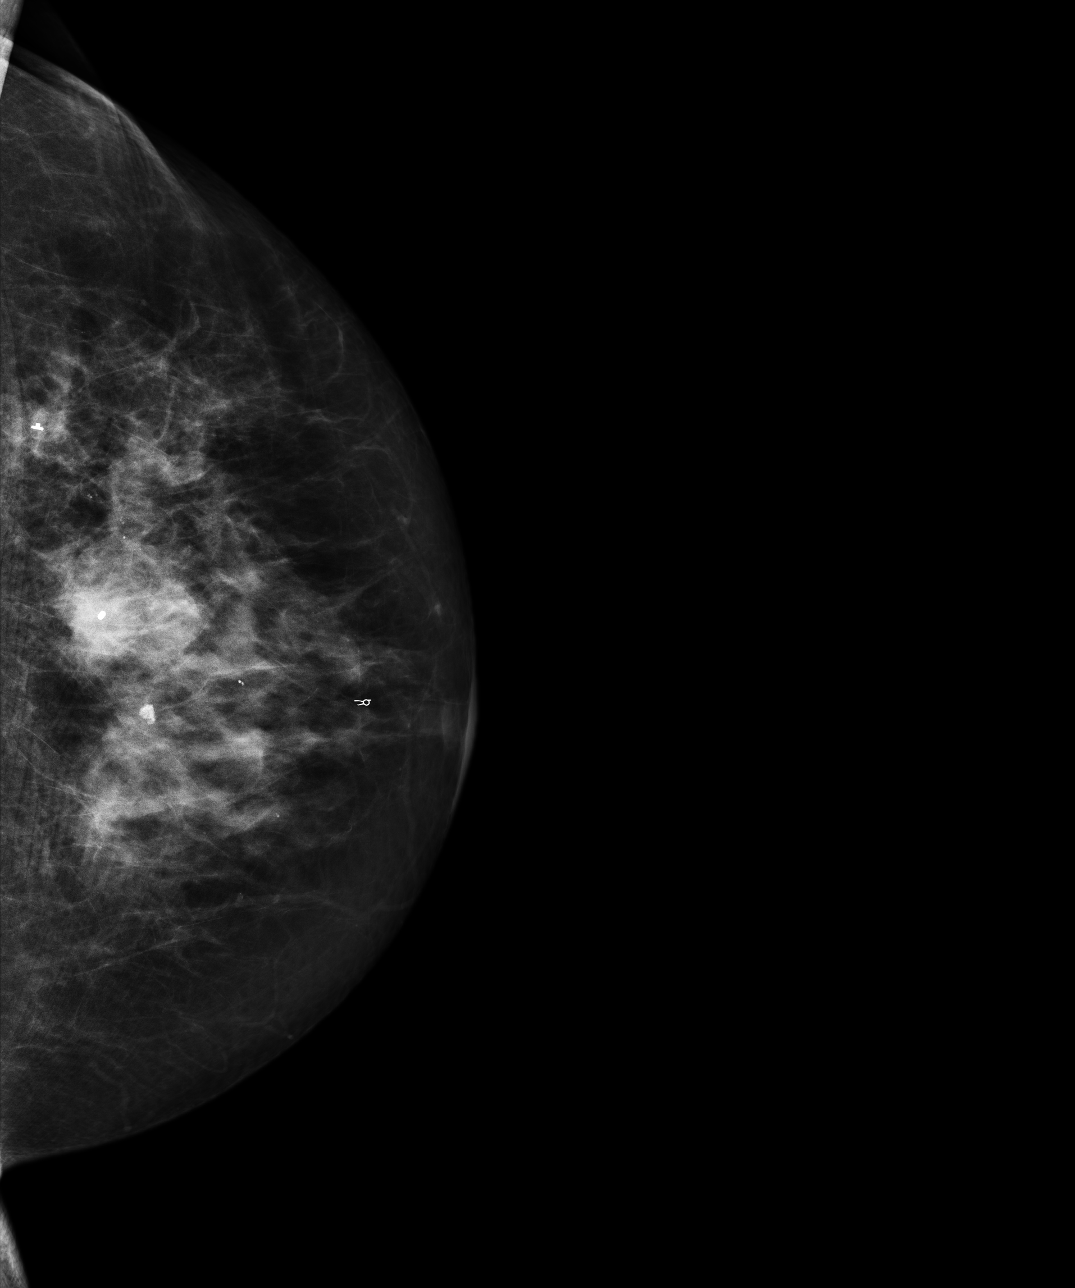
[im 2/2]
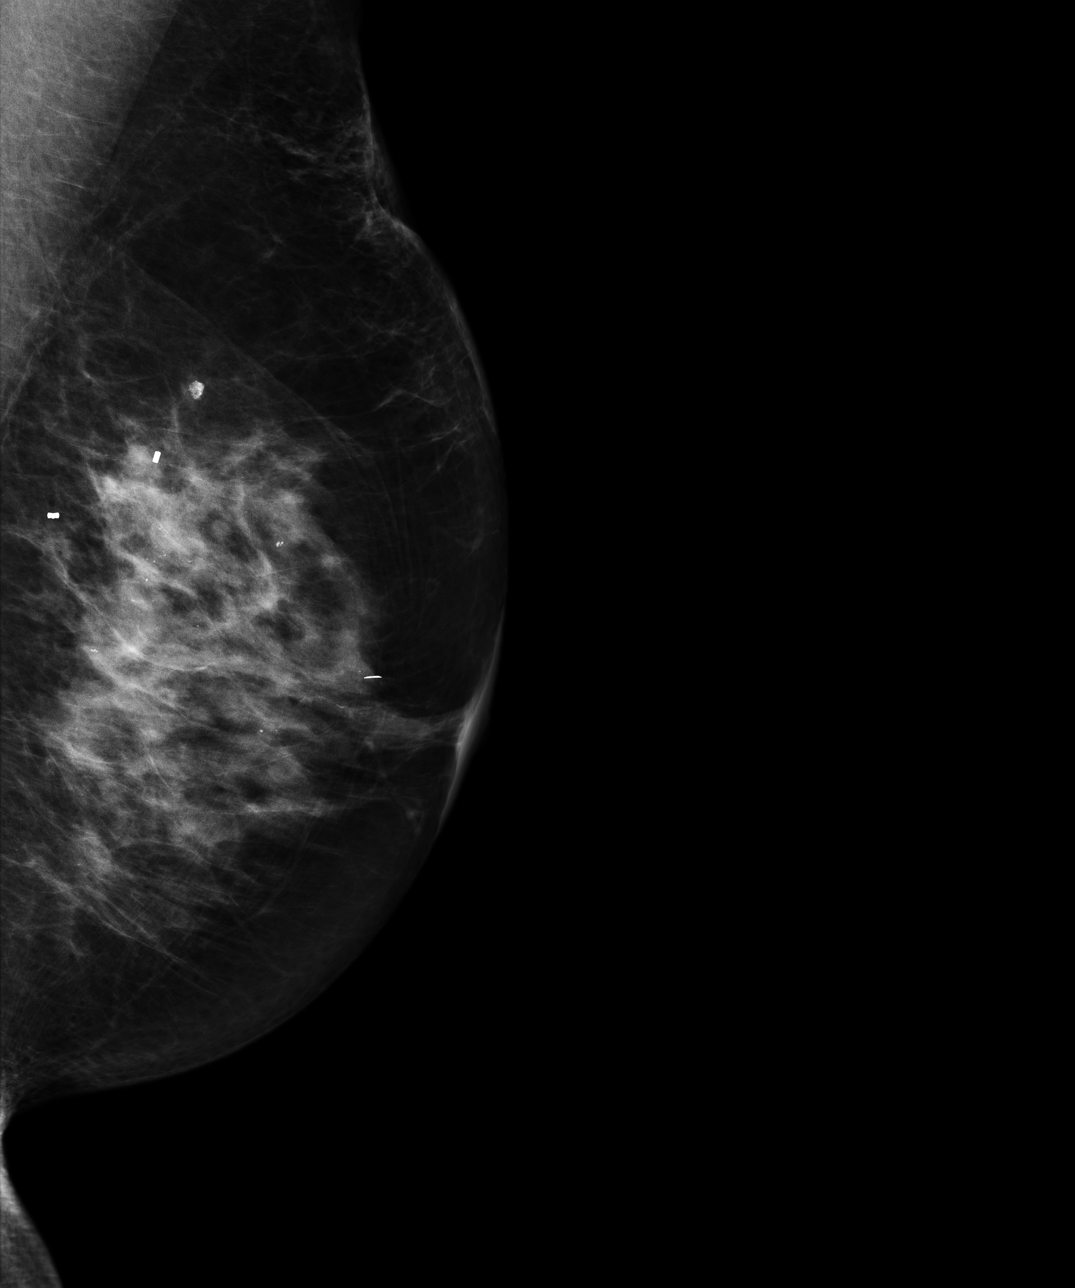

[4 of 4 positions shown; findings below may reference images not displayed]

FINDINGS: Patient presents for needle localization prior to left breast
surgical excision. I met with the patient and we discussed the
procedure of needle localization including benefits and
alternatives. We discussed the high likelihood of a successful
procedure. We discussed the risks of the procedure, including
infection, bleeding, tissue injury, and further surgery. Informed,
written consent was given. The usual time-out protocol was performed
immediately prior to the procedure.

Using mammographic guidance, sterile technique, 2% lidocaine and two
separate modified Kopans needle, the top hat and cylinder clips were
bracketed using a lateral approach. A third wire was planned to
bracket the more anterior calcifications as well, but the patient
experienced a vasovagal reaction and the third wire could not be
placed. The patient was stable and alert on exiting the radiology
suite.

The films were marked for Dr. Lesya.

Specimen radiograph was performed at [REDACTED] and confirms the 2 wires, 2 clips and calcifications present
in the tissue sample.
IMPRESSION: Needle localization of the left breast. No apparent complications.

## 2015-05-17 ENCOUNTER — Ambulatory Visit: Payer: Federal, State, Local not specified - PPO | Admitting: Podiatry

## 2015-05-17 ENCOUNTER — Ambulatory Visit (INDEPENDENT_AMBULATORY_CARE_PROVIDER_SITE_OTHER): Payer: Federal, State, Local not specified - PPO | Admitting: Family Medicine

## 2015-05-17 ENCOUNTER — Encounter: Payer: Self-pay | Admitting: Family Medicine

## 2015-05-17 VITALS — BP 110/64 | HR 84 | Temp 98.0°F | Resp 16 | Wt 158.0 lb

## 2015-05-17 DIAGNOSIS — K760 Fatty (change of) liver, not elsewhere classified: Secondary | ICD-10-CM | POA: Diagnosis not present

## 2015-05-17 DIAGNOSIS — J302 Other seasonal allergic rhinitis: Secondary | ICD-10-CM | POA: Diagnosis not present

## 2015-05-17 DIAGNOSIS — E785 Hyperlipidemia, unspecified: Secondary | ICD-10-CM | POA: Diagnosis not present

## 2015-05-17 DIAGNOSIS — I10 Essential (primary) hypertension: Secondary | ICD-10-CM | POA: Diagnosis not present

## 2015-05-17 MED ORDER — QUINAPRIL-HYDROCHLOROTHIAZIDE 10-12.5 MG PO TABS: 1.0000 | ORAL_TABLET | Freq: Every day | ORAL | Status: DC

## 2015-05-17 MED ORDER — MONTELUKAST SODIUM 10 MG PO TABS: 10.0000 mg | ORAL_TABLET | Freq: Every day | ORAL | Status: DC

## 2015-05-17 NOTE — Progress Notes (Signed)
Subjective:    Patient ID: Alyssa Thornton, female    DOB: 03-23-1956, 60 y.o.   MRN: [id number]  Hypertension This is a chronic problem. The problem is unchanged. The problem is controlled. Pertinent negatives include no anxiety, blurred vision, chest pain, headaches, malaise/fatigue, neck pain, orthopnea, palpitations, peripheral edema, shortness of breath or sweats. Risk factors for coronary artery disease include post-menopausal state and stress. Treatments tried: Quinapril-HCTZ 10-12.5 mg. The current treatment provides moderate improvement. There are no compliance problems.    Allergic Rhinitis: [name] is here for evaluation of possible allergic rhinitis. Patient's symptoms include None. These symptoms are seasonal. Current triggers include exposure to "winter allergies". The patient has been suffering from these symptoms for approximately 2 years. The patient has tried Singulair and Flonase with good relief of symptoms. Pt needs refill on Singulair.    Review of Systems  Constitutional: Negative for malaise/fatigue.  Eyes: Negative for blurred vision.  Respiratory: Negative for shortness of breath.   Cardiovascular: Negative for chest pain, palpitations and orthopnea.  Musculoskeletal: Negative for neck pain.  Neurological: Negative for headaches.   BP 110/64 mmHg  Pulse 84  Temp(Src) 98 F (36.7 C) (Oral)  Resp 16  Wt 158 lb (71.668 kg)   Patient Active Problem List   Diagnosis Date Noted  . Abdominal pain 03/31/2015  . Allergic rhinitis 03/31/2015  . Hypercholesteremia 03/31/2015  . Chest pain at rest 03/31/2015  . Accumulation of fluid in tissues 03/31/2015  . Fatty infiltration of liver 03/31/2015  . BP (high blood pressure) 03/31/2015  . Headache, migraine 03/31/2015  . Plantar fasciitis 03/31/2015  . Atypical hyperplasia of left breast 07/10/2013  . Papilloma of breast 07/01/2013  . Breast calcification seen on mammogram 06/19/2013  . Breast mass  06/19/2013   Past Medical History  Diagnosis Date  . Fatty liver   . Breast mass   . Hypertension   . Atypical hyperplasia of left breast July 02, 2013    Left breast papilloma   Current Outpatient Prescriptions on File Prior to Visit  Medication Sig  . aspirin 81 MG tablet Take 81 mg by mouth daily.  . Cholecalciferol (VITAMIN D-3 PO) Take 4,000 Int'l Units by mouth 4 (four) times daily.  Marland Kitchen FINACEA 15 % cream   . fluticasone (FLONASE) 50 MCG/ACT nasal spray Place 1 spray into both nostrils daily as needed.  . metroNIDAZOLE (METROGEL) 1 % gel Apply 1 application topically daily.  . montelukast (SINGULAIR) 10 MG tablet Take 1 tablet (10 mg total) by mouth daily.  . Multiple Vitamin (MULTIVITAMIN) tablet Take 1 tablet by mouth daily.  Marland Kitchen PREMARIN vaginal cream Place 1 Applicatorful vaginally 2 (two) times a week.  . quinapril-hydrochlorothiazide (ACCURETIC) 10-12.5 MG tablet TAKE ONE TABLET BY MOUTH EVERY DAY  . tamoxifen (NOLVADEX) 20 MG tablet TAKE ONE TABLET BY MOUTH EVERY DAY  . vitamin C (ASCORBIC ACID) 500 MG tablet Take 500 mg by mouth daily.  . vitamin E (VITAMIN E) 400 UNIT capsule Take 400 Units by mouth daily.   No current facility-administered medications on file prior to visit.   Allergies  Allergen Reactions  . Diclofenac Sodium Diarrhea  . Tramadol Diarrhea   Past Surgical History  Procedure Laterality Date  . Colonoscopy  2007-2008, 2015    Dr Byrnett/Dr Allen Norris  . Breast biopsy Left 2007  . Breast biopsy Left July 02, 2013    left breast papilloma with focal atypia  . Tonsillectomy and adenoidectomy  1960  .  Bartholins gland surgery     Social History   Social History  . Marital Status: Married    Spouse Name: N/A  . Number of Children: N/A  . Years of Education: N/A   Occupational History  . Not on file.   Social History Main Topics  . Smoking status: Never Smoker   . Smokeless tobacco: Never Used  . Alcohol Use: No  . Drug Use: No  . Sexual  Activity: Not on file   Other Topics Concern  . Not on file   Social History Narrative   Family History  Problem Relation Age of Onset  . Heart disease Father   . Cancer Sister     bone marrow cancer  . Cancer - Ovarian Maternal Grandmother     90  . Hypercholesterolemia Brother       Objective:   Physical Exam  Constitutional: She is oriented to person, place, and time. She appears well-developed and well-nourished.  Cardiovascular: Normal rate and regular rhythm.   Pulmonary/Chest: Effort normal and breath sounds normal.  Neurological: She is alert and oriented to person, place, and time.  Psychiatric: She has a normal mood and affect. Her behavior is normal. Thought content normal.   BP 110/64 mmHg  Pulse 84  Temp(Src) 98 F (36.7 C) (Oral)  Resp 16  Wt 158 lb (71.668 kg)      Assessment & Plan:  1. Essential hypertension Stable. Continue current medication and plan of care.    - quinapril-hydrochlorothiazide (ACCURETIC) 10-12.5 MG tablet; Take 1 tablet by mouth daily.  Dispense: 90 tablet; Refill: 3 - CBC with Differential/Platelet  2. Other seasonal allergic rhinitis Not as goal. Will increase medication.  Patient instructed to call back if condition worsens or does not improve.    - montelukast (SINGULAIR) 10 MG tablet; Take 1 tablet (10 mg total) by mouth daily.  Dispense: 90 tablet; Refill: 3  3. Hyperlipidemia Stable.  - TSH - Lipid panel  4. Fatty infiltration of liver Check labs.   - Comprehensive metabolic panel  Patient was seen and examined by Jerrell Belfast, MD, and note scribed by Renaldo Fiddler, CMA. I have reviewed the document for accuracy and completeness and I agree with above. Jerrell Belfast, MD  Margarita Rana, MD

## 2015-05-18 ENCOUNTER — Telehealth: Payer: Self-pay

## 2015-05-18 LAB — COMPREHENSIVE METABOLIC PANEL
ALT: 23 IU/L (ref 0–32)
AST: 23 IU/L (ref 0–40)
Albumin/Globulin Ratio: 2 (ref 1.1–2.5)
Albumin: 4.3 g/dL (ref 3.6–4.8)
Alkaline Phosphatase: 113 IU/L (ref 39–117)
BUN/Creatinine Ratio: 23 (ref 11–26)
BUN: 14 mg/dL (ref 8–27)
Bilirubin Total: 0.8 mg/dL (ref 0.0–1.2)
CO2: 25 mmol/L (ref 18–29)
Calcium: 9.3 mg/dL (ref 8.7–10.3)
Chloride: 101 mmol/L (ref 96–106)
Creatinine, Ser: 0.62 mg/dL (ref 0.57–1.00)
GFR calc Af Amer: 113 mL/min/{1.73_m2} (ref >59)
GFR calc non Af Amer: 98 mL/min/{1.73_m2} (ref >59)
Globulin, Total: 2.1 g/dL (ref 1.5–4.5)
Glucose: 96 mg/dL (ref 65–99)
Potassium: 4.1 mmol/L (ref 3.5–5.2)
Sodium: 142 mmol/L (ref 134–144)
Total Protein: 6.4 g/dL (ref 6.0–8.5)

## 2015-05-18 LAB — CBC WITH DIFFERENTIAL/PLATELET
Basophils Absolute: 0 10*3/uL (ref 0.0–0.2)
Basos: 1 %
EOS (ABSOLUTE): 0.1 10*3/uL (ref 0.0–0.4)
Eos: 2 %
Hematocrit: 41.2 % (ref 34.0–46.6)
Hemoglobin: 14.3 g/dL (ref 11.1–15.9)
Immature Grans (Abs): 0 10*3/uL (ref 0.0–0.1)
Immature Granulocytes: 0 %
Lymphocytes Absolute: 1.9 10*3/uL (ref 0.7–3.1)
Lymphs: 38 %
MCH: 30.8 pg (ref 26.6–33.0)
MCHC: 34.7 g/dL (ref 31.5–35.7)
MCV: 89 fL (ref 79–97)
Monocytes Absolute: 0.3 10*3/uL (ref 0.1–0.9)
Monocytes: 7 %
Neutrophils Absolute: 2.6 10*3/uL (ref 1.4–7.0)
Neutrophils: 52 %
Platelets: 159 10*3/uL (ref 150–379)
RBC: 4.64 x10E6/uL (ref 3.77–5.28)
RDW: 13.3 % (ref 12.3–15.4)
WBC: 4.9 10*3/uL (ref 3.4–10.8)

## 2015-05-18 LAB — LIPID PANEL
Chol/HDL Ratio: 2.6 ratio units (ref 0.0–4.4)
Cholesterol, Total: 176 mg/dL (ref 100–199)
HDL: 69 mg/dL (ref >39)
LDL Calculated: 91 mg/dL (ref 0–99)
Triglycerides: 81 mg/dL (ref 0–149)
VLDL Cholesterol Cal: 16 mg/dL (ref 5–40)

## 2015-05-18 LAB — TSH: TSH: 2.72 u[IU]/mL (ref 0.450–4.500)

## 2015-05-18 NOTE — Telephone Encounter (Signed)
Pt advised.   Thanks,   -Denetra Formoso  

## 2015-05-18 NOTE — Telephone Encounter (Signed)
-----   Message from Margarita Rana, MD sent at 05/18/2015 10:41 AM EST ----- Labs normal.  Please notify patient. Thanks.

## 2015-09-09 ENCOUNTER — Other Ambulatory Visit: Payer: Self-pay | Admitting: General Surgery

## 2015-10-16 ENCOUNTER — Ambulatory Visit (INDEPENDENT_AMBULATORY_CARE_PROVIDER_SITE_OTHER): Payer: Federal, State, Local not specified - PPO | Admitting: Family Medicine

## 2015-10-16 VITALS — BP 126/78 | HR 82 | Temp 98.2°F | Resp 16 | Wt 159.0 lb

## 2015-10-16 DIAGNOSIS — J302 Other seasonal allergic rhinitis: Secondary | ICD-10-CM | POA: Diagnosis not present

## 2015-10-16 DIAGNOSIS — I10 Essential (primary) hypertension: Secondary | ICD-10-CM

## 2015-10-16 MED ORDER — FLUTICASONE PROPIONATE 50 MCG/ACT NA SUSP: 1.0000 | Freq: Every day | NASAL | Status: DC | PRN

## 2015-10-16 MED ORDER — QUINAPRIL-HYDROCHLOROTHIAZIDE 10-12.5 MG PO TABS: 1.0000 | ORAL_TABLET | Freq: Every day | ORAL | Status: DC

## 2015-10-16 NOTE — Progress Notes (Signed)
Alyssa Thornton  MRN: [id number] DOB: Feb 03, 1956  Subjective:  HPI   The patient is a 60 year old female who is a former patient of Dr Venia Minks.  She was last seen on 05/17/15.  She is here for follow up of her hypertension and allergic rhinitis.  She does not check her blood pressure outside of the office as it has been well controlled for some time.  She is currently using Quinapril HCTZ and needs to get a refill. She is using Flonase for her allergies and needs refill.  Patient Active Problem List   Diagnosis Date Noted  . Abdominal pain 03/31/2015  . Allergic rhinitis 03/31/2015  . Hyperlipidemia 03/31/2015  . Accumulation of fluid in tissues 03/31/2015  . Fatty infiltration of liver 03/31/2015  . BP (high blood pressure) 03/31/2015  . Headache, migraine 03/31/2015  . Plantar fasciitis 03/31/2015  . Atypical hyperplasia of left breast 07/10/2013  . Papilloma of breast 07/01/2013  . Breast calcification seen on mammogram 06/19/2013    Past Medical History  Diagnosis Date  . Fatty liver   . Breast mass   . Hypertension   . Atypical hyperplasia of left breast July 02, 2013    Left breast papilloma    Social History   Social History  . Marital Status: Married    Spouse Name: N/A  . Number of Children: N/A  . Years of Education: N/A   Occupational History  . Not on file.   Social History Main Topics  . Smoking status: Never Smoker   . Smokeless tobacco: Never Used  . Alcohol Use: No  . Drug Use: No  . Sexual Activity: Not on file   Other Topics Concern  . Not on file   Social History Narrative    Outpatient Prescriptions Prior to Visit  Medication Sig Dispense Refill  . aspirin 81 MG tablet Take 81 mg by mouth daily.    . Cholecalciferol (VITAMIN D-3 PO) Take 4,000 Int'l Units by mouth 4 (four) times daily.    Marland Kitchen FINACEA 15 % cream     . fluticasone (FLONASE) 50 MCG/ACT nasal spray Place 1 spray into both nostrils daily as needed.    . metroNIDAZOLE  (METROGEL) 1 % gel Apply 1 application topically daily.    . montelukast (SINGULAIR) 10 MG tablet Take 1 tablet (10 mg total) by mouth daily. 90 tablet 3  . Multiple Vitamin (MULTIVITAMIN) tablet Take 1 tablet by mouth daily.    Marland Kitchen PREMARIN vaginal cream Place 1 Applicatorful vaginally 2 (two) times a week.    . quinapril-hydrochlorothiazide (ACCURETIC) 10-12.5 MG tablet Take 1 tablet by mouth daily. 90 tablet 3  . tamoxifen (NOLVADEX) 20 MG tablet take 1 tablet by mouth once daily 90 tablet 1  . vitamin C (ASCORBIC ACID) 500 MG tablet Take 500 mg by mouth daily.    . vitamin E (VITAMIN E) 400 UNIT capsule Take 400 Units by mouth daily.     No facility-administered medications prior to visit.    Allergies  Allergen Reactions  . Diclofenac Sodium Diarrhea  . Tramadol Diarrhea    Review of Systems  Constitutional: Negative for fever and malaise/fatigue.  Eyes: Negative.   Respiratory: Negative for cough, shortness of breath and wheezing.   Cardiovascular: Negative for chest pain, palpitations, orthopnea, claudication, leg swelling and PND.  Gastrointestinal: Negative.   Neurological: Negative for dizziness, weakness and headaches.  Endo/Heme/Allergies: Negative.   Psychiatric/Behavioral: Negative.    Objective:  BP 126/78 mmHg  Pulse 82  Temp(Src) 98.2 F (36.8 C) (Oral)  Resp 16  Wt 159 lb (72.122 kg)  Physical Exam  Constitutional: She is well-developed, well-nourished, and in no distress.  HENT:  Head: Normocephalic and atraumatic.  Right Ear: External ear normal.  Left Ear: External ear normal.  Nose: Nose normal.  Eyes: Conjunctivae are normal. Pupils are equal, round, and reactive to light.  Neck: Normal range of motion.  Cardiovascular: Normal rate, regular rhythm and normal heart sounds.   Pulmonary/Chest: Effort normal and breath sounds normal.  Neurological: She is alert. Gait normal.  Skin: Skin is warm and dry.  Psychiatric: Mood, memory, affect and judgment  normal.    Assessment and Plan :  1. Essential hypertension  - quinapril-hydrochlorothiazide (ACCURETIC) 10-12.5 MG tablet; Take 1 tablet by mouth daily.  Dispense: 90 tablet; Refill: 3  2. Other seasonal allergic rhinitis  - fluticasone (FLONASE) 50 MCG/ACT nasal spray; Place 1 spray into both nostrils daily as needed.  Dispense: 16 g; Refill: 12 3.Breast Papilloma with atypia  I have done the exam and reviewed the above chart and it is accurate to the best of my knowledge.  Miguel Aschoff MD [facility] 10/16/2015 2:39 PM

## 2015-12-06 ENCOUNTER — Other Ambulatory Visit: Payer: Self-pay | Admitting: General Surgery

## 2015-12-06 DIAGNOSIS — N6082 Other benign mammary dysplasias of left breast: Secondary | ICD-10-CM

## 2015-12-18 ENCOUNTER — Other Ambulatory Visit: Payer: Self-pay | Admitting: *Deleted

## 2015-12-18 ENCOUNTER — Inpatient Hospital Stay
Admission: RE | Admit: 2015-12-18 | Discharge: 2015-12-18 | Disposition: A | Discharge status: Home / Self Care | Payer: Self-pay | Source: Ambulatory Visit | Attending: *Deleted | Admitting: *Deleted

## 2015-12-18 DIAGNOSIS — Z9289 Personal history of other medical treatment: Secondary | ICD-10-CM

## 2015-12-19 ENCOUNTER — Other Ambulatory Visit: Payer: Self-pay | Admitting: General Surgery

## 2015-12-19 DIAGNOSIS — Z1231 Encounter for screening mammogram for malignant neoplasm of breast: Secondary | ICD-10-CM

## 2015-12-20 ENCOUNTER — Encounter: Payer: Self-pay | Admitting: *Deleted

## 2016-01-31 ENCOUNTER — Ambulatory Visit: Payer: Self-pay

## 2016-02-01 DIAGNOSIS — Z1231 Encounter for screening mammogram for malignant neoplasm of breast: Secondary | ICD-10-CM | POA: Diagnosis not present

## 2016-02-02 ENCOUNTER — Other Ambulatory Visit: Payer: Self-pay | Admitting: *Deleted

## 2016-02-02 DIAGNOSIS — Z1231 Encounter for screening mammogram for malignant neoplasm of breast: Secondary | ICD-10-CM

## 2016-02-13 ENCOUNTER — Encounter: Payer: Self-pay | Admitting: *Deleted

## 2016-02-15 ENCOUNTER — Encounter: Payer: Self-pay | Admitting: General Surgery

## 2016-02-15 ENCOUNTER — Ambulatory Visit (INDEPENDENT_AMBULATORY_CARE_PROVIDER_SITE_OTHER): Payer: Federal, State, Local not specified - PPO | Admitting: General Surgery

## 2016-02-15 VITALS — BP 120/78 | HR 82 | Resp 14 | Ht 63.0 in | Wt 159.0 lb

## 2016-02-15 DIAGNOSIS — N62 Hypertrophy of breast: Secondary | ICD-10-CM | POA: Diagnosis not present

## 2016-02-15 DIAGNOSIS — N6092 Unspecified benign mammary dysplasia of left breast: Secondary | ICD-10-CM

## 2016-02-15 NOTE — Patient Instructions (Signed)
Patient will be asked to return to the office in one year with a bilateral screening mammogram. 

## 2016-02-15 NOTE — Progress Notes (Signed)
Patient ID: Alyssa Thornton, female   DOB: 1956-03-27, 60 y.o.   MRN: [id number]  Chief Complaint  Patient presents with  . Follow-up    mammogram    HPI Alyssa Thornton is a 60 y.o. female who presents for a breast evaluation. The most recent mammogram was done on 01/31/2016 .  Patient does perform regular self breast checks and gets regular mammograms done.    HPI  Past Medical History:  Diagnosis Date  . Atypical hyperplasia of left breast 07/02/2013   Left breast papilloma, patient declined formal excision.  . Breast mass   . Fatty liver   . Hypertension     Past Surgical History:  Procedure Laterality Date  . bartholins gland surgery    . BREAST BIOPSY Left 2007  . BREAST BIOPSY Left July 02, 2013   left breast papilloma with focal atypia  . COLONOSCOPY  2007-2008, 2015   Dr Antonetta Clanton/Dr Allen Norris  . TONSILLECTOMY AND ADENOIDECTOMY  1960    Family History  Problem Relation Age of Onset  . Heart disease Father   . Cancer Sister     bone marrow cancer  . Cancer - Ovarian Maternal Grandmother     90  . Hypercholesterolemia Brother     Social History Social History  Substance Use Topics  . Smoking status: Never Smoker  . Smokeless tobacco: Never Used  . Alcohol use No    Allergies  Allergen Reactions  . Diclofenac Sodium Diarrhea  . Tramadol Diarrhea    Current Outpatient Prescriptions  Medication Sig Dispense Refill  . aspirin 81 MG tablet Take 81 mg by mouth daily.    . Cholecalciferol (VITAMIN D-3 PO) Take 4,000 Int'l Units by mouth 4 (four) times daily.    Marland Kitchen FINACEA 15 % cream     . fluticasone (FLONASE) 50 MCG/ACT nasal spray Place 1 spray into both nostrils daily as needed. 16 g 12  . metroNIDAZOLE (METROGEL) 1 % gel Apply 1 application topically daily.    . montelukast (SINGULAIR) 10 MG tablet Take 1 tablet (10 mg total) by mouth daily. 90 tablet 3  . Multiple Vitamin (MULTIVITAMIN) tablet Take 1 tablet by mouth daily.    Marland Kitchen PREMARIN vaginal cream Place  1 Applicatorful vaginally 2 (two) times a week.    . quinapril-hydrochlorothiazide (ACCURETIC) 10-12.5 MG tablet Take 1 tablet by mouth daily. 90 tablet 3  . tamoxifen (NOLVADEX) 20 MG tablet take 1 tablet by mouth once daily 90 tablet 1  . vitamin C (ASCORBIC ACID) 500 MG tablet Take 500 mg by mouth daily.    . vitamin E (VITAMIN E) 400 UNIT capsule Take 400 Units by mouth daily.     No current facility-administered medications for this visit.     Review of Systems Review of Systems  Constitutional: Negative.   Respiratory: Negative.   Cardiovascular: Negative.     Blood pressure 120/78, pulse 82, resp. rate 14, height 5\' 3"  (1.6 m), weight 159 lb (72.1 kg).  Physical Exam Physical Exam  Constitutional: She is oriented to person, place, and time. She appears well-developed and well-nourished.  Eyes: Conjunctivae are normal. No scleral icterus.  Neck: Neck supple.  Cardiovascular: Normal rate, regular rhythm and normal heart sounds.   Pulmonary/Chest: Effort normal and breath sounds normal. Right breast exhibits no inverted nipple, no mass, no nipple discharge, no skin change and no tenderness. Left breast exhibits no inverted nipple, no mass, no nipple discharge, no skin change and no tenderness.  Abdominal: Soft. Bowel sounds are normal. There is no tenderness.  Lymphadenopathy:    She has no cervical adenopathy.    She has no axillary adenopathy.  Neurological: She is alert and oriented to person, place, and time.  Skin: Skin is warm and dry.    Data Reviewed Bilateral screening mammograms dated 02/01/2016 completed at [facility] showed postsurgical changes. No interval areas of suspicion. BI-RADS-2. Stable changes around the previous biopsy clip.  Assessment    Unremarkable breast exam  Minimal weight gain over the last year breath sees 1 pound), improved from 2015-2016 when there was a 9 pound weight gain.    Plan        Patient will be asked to return to  the office in one year with a bilateral screening mammogram.  This information has been scribed by Gaspar Cola CMA.   Robert Bellow 02/15/2016, 8:46 PM

## 2016-03-02 ENCOUNTER — Other Ambulatory Visit: Payer: Self-pay | Admitting: General Surgery

## 2016-03-22 ENCOUNTER — Ambulatory Visit (INDEPENDENT_AMBULATORY_CARE_PROVIDER_SITE_OTHER): Payer: Federal, State, Local not specified - PPO | Admitting: Physician Assistant

## 2016-03-22 ENCOUNTER — Encounter: Payer: Self-pay | Admitting: Physician Assistant

## 2016-03-22 VITALS — BP 148/88 | HR 88 | Temp 98.3°F | Resp 16 | Wt 161.0 lb

## 2016-03-22 DIAGNOSIS — J019 Acute sinusitis, unspecified: Secondary | ICD-10-CM

## 2016-03-22 MED ORDER — AMOXICILLIN 875 MG PO TABS
875.0000 mg | ORAL_TABLET | Freq: Two times a day (BID) | ORAL | 0 refills | Status: AC
Start: 2016-03-22 — End: 2016-04-01

## 2016-03-22 NOTE — Patient Instructions (Signed)

## 2016-03-22 NOTE — Progress Notes (Signed)
Patient: Alyssa Thornton Female    DOB: [birth year]   60 y.o.   MRN: [id number] Visit Date: 03/22/2016  Today's Provider: Trinna Post, PA-C   Chief Complaint  Patient presents with  . Sinusitis    Started around Thanksgiving time.   Subjective:    Sinusitis  This is a new problem. The current episode started 1 to 4 weeks ago (Started around Thanksgiving.). The problem is unchanged. The fever has been present for less than 1 day. Associated symptoms include congestion, coughing, shortness of breath (Sometimes), sinus pressure and a sore throat. Pertinent negatives include no chills, diaphoresis, ear pain (Pt says her ears feel "Clogged"), headaches or sneezing. Past treatments include acetaminophen and oral decongestants. The treatment provided mild relief.    Allergies  Allergen Reactions  . Diclofenac Sodium Diarrhea  . Tramadol Diarrhea     Current Outpatient Prescriptions:  .  aspirin 81 MG tablet, Take 81 mg by mouth daily., Disp: , Rfl:  .  Cholecalciferol (VITAMIN D-3 PO), Take 4,000 Int'l Units by mouth 4 (four) times daily., Disp: , Rfl:  .  FINACEA 15 % cream, , Disp: , Rfl:  .  fluticasone (FLONASE) 50 MCG/ACT nasal spray, Place 1 spray into both nostrils daily as needed., Disp: 16 g, Rfl: 12 .  metroNIDAZOLE (METROGEL) 1 % gel, Apply 1 application topically daily., Disp: , Rfl:  .  montelukast (SINGULAIR) 10 MG tablet, Take 1 tablet (10 mg total) by mouth daily., Disp: 90 tablet, Rfl: 3 .  Multiple Vitamin (MULTIVITAMIN) tablet, Take 1 tablet by mouth daily., Disp: , Rfl:  .  PREMARIN vaginal cream, Place 1 Applicatorful vaginally 2 (two) times a week., Disp: , Rfl:  .  quinapril-hydrochlorothiazide (ACCURETIC) 10-12.5 MG tablet, Take 1 tablet by mouth daily., Disp: 90 tablet, Rfl: 3 .  tamoxifen (NOLVADEX) 20 MG tablet, take 1 tablet by mouth once daily, Disp: 90 tablet, Rfl: 1 .  vitamin C (ASCORBIC ACID) 500 MG tablet, Take 500 mg by mouth daily., Disp: ,  Rfl:  .  vitamin E (VITAMIN E) 400 UNIT capsule, Take 400 Units by mouth daily., Disp: , Rfl:  .  amoxicillin (AMOXIL) 875 MG tablet, Take 1 tablet (875 mg total) by mouth 2 (two) times daily., Disp: 20 tablet, Rfl: 0  Review of Systems  Constitutional: Positive for fatigue. Negative for activity change, appetite change, chills, diaphoresis, fever and unexpected weight change.  HENT: Positive for congestion, nosebleeds, postnasal drip, rhinorrhea, sinus pain, sinus pressure and sore throat. Negative for ear discharge, ear pain (Pt says her ears feel "Clogged"), sneezing, tinnitus, trouble swallowing and voice change.   Eyes: Negative.   Respiratory: Positive for cough, shortness of breath (Sometimes) and wheezing. Negative for apnea, choking, chest tightness and stridor.   Gastrointestinal: Negative.   Neurological: Negative for dizziness, light-headedness and headaches.    Social History  Substance Use Topics  . Smoking status: Never Smoker  . Smokeless tobacco: Never Used  . Alcohol use No   Objective:   BP (!) 148/88 (BP Location: Left Arm, Patient Position: Sitting, Cuff Size: Normal)   Pulse 88   Temp 98.3 F (36.8 C) (Oral)   Resp 16   Wt 161 lb (73 kg)   BMI 28.52 kg/m   Physical Exam  Constitutional: She is oriented to person, place, and time. She appears well-developed and well-nourished. No distress.  HENT:  Right Ear: External ear normal.  Left Ear: External ear normal.  Nose: Right sinus exhibits maxillary sinus tenderness and frontal sinus tenderness. Left sinus exhibits maxillary sinus tenderness and frontal sinus tenderness.  Mouth/Throat: Oropharynx is clear and moist. No oropharyngeal exudate, posterior oropharyngeal edema or posterior oropharyngeal erythema.  Tms opaque bilaterally   Eyes: Conjunctivae are normal. Right eye exhibits no discharge. Left eye exhibits no discharge.  Neck: Neck supple.  Cardiovascular: Normal rate and regular rhythm.     Pulmonary/Chest: Effort normal and breath sounds normal.  Lymphadenopathy:    She has cervical adenopathy.  Neurological: She is alert and oriented to person, place, and time.  Skin: Skin is warm and dry. She is not diaphoretic.  Psychiatric: She has a normal mood and affect. Her behavior is normal.        Assessment & Plan:      Problem List Items Addressed This Visit    None    Visit Diagnoses    Acute sinusitis, recurrence not specified, unspecified location    -  Primary   Relevant Medications   amoxicillin (AMOXIL) 875 MG tablet     Patient is 60 y/o female presenting with symptoms concerning for bacterial sinusitis. Will treat with Amoxicillin 875 mg BID x 10 days. Patient to call back if not feeling better.  Return if symptoms worsen or fail to improve.  The entirety of the information documented in the History of Present Illness, Review of Systems and Physical Exam were personally obtained by me. Portions of this information were initially documented by Ashley Royalty, CMA and reviewed by me for thoroughness and accuracy.    Patient Instructions  Sinusitis, Adult Sinusitis is soreness and inflammation of your sinuses. Sinuses are hollow spaces in the bones around your face. They are located:  Around your eyes.  In the middle of your forehead.  Behind your nose.  In your cheekbones. Your sinuses and nasal passages are lined with a stringy fluid (mucus). Mucus normally drains out of your sinuses. When your nasal tissues get inflamed or swollen, the mucus can get trapped or blocked so air cannot flow through your sinuses. This lets bacteria, viruses, and funguses grow, and that leads to infection. Follow these instructions at home: Medicines  Take, use, or apply over-the-counter and prescription medicines only as told by your doctor. These may include nasal sprays.  If you were prescribed an antibiotic medicine, take it as told by your doctor. Do not stop taking the  antibiotic even if you start to feel better. Hydrate and Humidify  Drink enough water to keep your pee (urine) clear or pale yellow.  Use a cool mist humidifier to keep the humidity level in your home above 50%.  Breathe in steam for 10-15 minutes, 3-4 times a day or as told by your doctor. You can do this in the bathroom while a hot shower is running.  Try not to spend time in cool or dry air. Rest  Rest as much as possible.  Sleep with your head raised (elevated).  Make sure to get enough sleep each night. General instructions  Put a warm, moist washcloth on your face 3-4 times a day or as told by your doctor. This will help with discomfort.  Wash your hands often with soap and water. If there is no soap and water, use hand sanitizer.  Do not smoke. Avoid being around people who are smoking (secondhand smoke).  Keep all follow-up visits as told by your doctor. This is important. Contact a doctor if:  You have a fever.  Your symptoms get worse.  Your symptoms do not get better within 10 days. Get help right away if:  You have a very bad headache.  You cannot stop throwing up (vomiting).  You have pain or swelling around your face or eyes.  You have trouble seeing.  You feel confused.  Your neck is stiff.  You have trouble breathing. This information is not intended to replace advice given to you by your health care provider. Make sure you discuss any questions you have with your health care provider. Document Released: 09/11/2007 Document Revised: 11/19/2015 Document Reviewed: 01/18/2015 Elsevier Interactive Patient Education  2017 [place]

## 2016-04-09 DIAGNOSIS — Z01419 Encounter for gynecological examination (general) (routine) without abnormal findings: Secondary | ICD-10-CM | POA: Diagnosis not present

## 2016-04-09 DIAGNOSIS — N951 Menopausal and female climacteric states: Secondary | ICD-10-CM | POA: Diagnosis not present

## 2016-04-09 DIAGNOSIS — Z1239 Encounter for other screening for malignant neoplasm of breast: Secondary | ICD-10-CM | POA: Diagnosis not present

## 2016-04-09 DIAGNOSIS — N952 Postmenopausal atrophic vaginitis: Secondary | ICD-10-CM | POA: Diagnosis not present

## 2016-04-09 DIAGNOSIS — M8589 Other specified disorders of bone density and structure, multiple sites: Secondary | ICD-10-CM | POA: Diagnosis not present

## 2016-04-09 DIAGNOSIS — M8588 Other specified disorders of bone density and structure, other site: Secondary | ICD-10-CM | POA: Diagnosis not present

## 2016-04-09 DIAGNOSIS — Z8041 Family history of malignant neoplasm of ovary: Secondary | ICD-10-CM | POA: Diagnosis not present

## 2016-04-29 DIAGNOSIS — J208 Acute bronchitis due to other specified organisms: Secondary | ICD-10-CM | POA: Diagnosis not present

## 2016-05-17 ENCOUNTER — Other Ambulatory Visit: Payer: Self-pay | Admitting: Family Medicine

## 2016-05-17 DIAGNOSIS — J302 Other seasonal allergic rhinitis: Secondary | ICD-10-CM

## 2016-05-17 NOTE — Telephone Encounter (Signed)
I denied this earlier due to her last office visit was in July 2017, patient called back and made an appointment, please see me about this later aa=aa

## 2016-05-17 NOTE — Telephone Encounter (Signed)
Pt needs a refill   montelukast (SINGULAIR) 10 MG tablet  Taking 05/17/15 -- Margarita Rana, MD    Take 1 tablet (10 mg total) by mouth daily.     [address]  Thank sTeri

## 2016-05-21 ENCOUNTER — Ambulatory Visit (INDEPENDENT_AMBULATORY_CARE_PROVIDER_SITE_OTHER): Payer: Federal, State, Local not specified - PPO | Admitting: Family Medicine

## 2016-05-21 ENCOUNTER — Encounter: Payer: Self-pay | Admitting: Family Medicine

## 2016-05-21 VITALS — BP 118/62 | HR 80 | Temp 97.8°F | Resp 16 | Wt 160.0 lb

## 2016-05-21 DIAGNOSIS — R739 Hyperglycemia, unspecified: Secondary | ICD-10-CM

## 2016-05-21 DIAGNOSIS — J302 Other seasonal allergic rhinitis: Secondary | ICD-10-CM | POA: Diagnosis not present

## 2016-05-21 DIAGNOSIS — I10 Essential (primary) hypertension: Secondary | ICD-10-CM

## 2016-05-21 DIAGNOSIS — J301 Allergic rhinitis due to pollen: Secondary | ICD-10-CM

## 2016-05-21 MED ORDER — FLUTICASONE PROPIONATE 50 MCG/ACT NA SUSP: 1.0000 | Freq: Every day | NASAL | 12 refills | Status: DC | PRN

## 2016-05-21 MED ORDER — MONTELUKAST SODIUM 10 MG PO TABS: 10.0000 mg | ORAL_TABLET | Freq: Every day | ORAL | 3 refills | Status: DC

## 2016-05-21 MED ORDER — QUINAPRIL-HYDROCHLOROTHIAZIDE 10-12.5 MG PO TABS: 1.0000 | ORAL_TABLET | Freq: Every day | ORAL | 3 refills | Status: DC

## 2016-05-21 NOTE — Progress Notes (Signed)
Patient: Alyssa Thornton Female    DOB: 21-Oct-1955   61 y.o.   MRN: [id number] Visit Date: 05/21/2016  Today's Provider: Wilhemena Durie, MD   Chief Complaint  Patient presents with  . Hypertension  . Allergic Rhinitis    Subjective:    HPI      Hypertension, follow-up:  BP Readings from Last 3 Encounters:  05/21/16 118/62  03/22/16 (!) 148/88  02/15/16 120/78    She was last seen for hypertension 7 months ago.  BP at that visit was 126/78. Management since that visit includes none. She reports excellent compliance with treatment. She is not having side effects.  She is exercising. Walks 7 miles per day at work. She is adherent to low salt diet.   Outside blood pressures are varying per pt. SBP can be up to 140 per pt. She is experiencing none.  Patient denies chest pain, chest pressure/discomfort, claudication, dyspnea, exertional chest pressure/discomfort, fatigue, irregular heart beat, lower extremity edema, near-syncope, orthopnea, palpitations and syncope.   Cardiovascular risk factors include hypertension.  Pt requesting refill on Quinapril.    Weight trend: stable Wt Readings from Last 3 Encounters:  05/21/16 160 lb (72.6 kg)  03/22/16 161 lb (73 kg)  02/15/16 159 lb (72.1 kg)    Current diet: well balanced.  ------------------------------------------------------------------------  Follow up for Allergic Reaction  The patient was last seen for this 7 months ago. Changes made at last visit include continuing medications. Pt needs refills on Flonase and Singulair.  She reports excellent compliance with treatment. She feels that condition is stable. Denies symptoms.. She is not having side effects.   ------------------------------------------------------------------------------------     Allergies  Allergen Reactions  . Diclofenac Sodium Diarrhea  . Tramadol Diarrhea  . Levaquin [Levofloxacin] Nausea And Vomiting     Current  Outpatient Prescriptions:  .  aspirin 81 MG tablet, Take 81 mg by mouth daily., Disp: , Rfl:  .  Cholecalciferol (VITAMIN D-3 PO), Take 4,000 Int'l Units by mouth 4 (four) times daily., Disp: , Rfl:  .  FINACEA 15 % cream, , Disp: , Rfl:  .  fluticasone (FLONASE) 50 MCG/ACT nasal spray, Place 1 spray into both nostrils daily as needed., Disp: 16 g, Rfl: 12 .  metroNIDAZOLE (METROGEL) 1 % gel, Apply 1 application topically daily., Disp: , Rfl:  .  montelukast (SINGULAIR) 10 MG tablet, Take 1 tablet (10 mg total) by mouth daily., Disp: 90 tablet, Rfl: 3 .  Multiple Vitamin (MULTIVITAMIN) tablet, Take 1 tablet by mouth daily., Disp: , Rfl:  .  PREMARIN vaginal cream, Place 1 Applicatorful vaginally 2 (two) times a week., Disp: , Rfl:  .  quinapril-hydrochlorothiazide (ACCURETIC) 10-12.5 MG tablet, Take 1 tablet by mouth daily., Disp: 90 tablet, Rfl: 3 .  tamoxifen (NOLVADEX) 20 MG tablet, take 1 tablet by mouth once daily, Disp: 90 tablet, Rfl: 1 .  vitamin C (ASCORBIC ACID) 500 MG tablet, Take 500 mg by mouth daily., Disp: , Rfl:  .  vitamin E (VITAMIN E) 400 UNIT capsule, Take 400 Units by mouth daily., Disp: , Rfl:   Review of Systems  Constitutional: Negative for activity change, appetite change, chills, diaphoresis, fatigue, fever and unexpected weight change.  HENT: Negative for congestion, ear pain, postnasal drip, rhinorrhea, sinus pain, sinus pressure, sneezing and sore throat.   Respiratory: Positive for cough ("once in a while"). Negative for shortness of breath.   Cardiovascular: Negative for chest pain, palpitations and leg swelling.  Social History  Substance Use Topics  . Smoking status: Never Smoker  . Smokeless tobacco: Never Used  . Alcohol use No   Objective:   BP 118/62 (BP Location: Right Arm, Patient Position: Sitting, Cuff Size: Large)   Pulse 80   Temp 97.8 F (36.6 C) (Oral)   Resp 16   Wt 160 lb (72.6 kg)   BMI 28.34 kg/m   Physical Exam  Constitutional:  She is oriented to person, place, and time. She appears well-developed and well-nourished.  HENT:  Head: Normocephalic and atraumatic.  Right Ear: External ear normal.  Left Ear: External ear normal.  Nose: Nose normal.  Eyes: Conjunctivae are normal. No scleral icterus.  Neck: No thyromegaly present.  Cardiovascular: Normal rate, regular rhythm and normal heart sounds.   Pulmonary/Chest: Effort normal and breath sounds normal.  Abdominal: Soft.  Musculoskeletal: She exhibits no edema.  Neurological: She is oriented to person, place, and time.  Skin: Skin is warm and dry.  Psychiatric: She has a normal mood and affect. Her behavior is normal. Judgment and thought content normal.        Assessment & Plan:     1. Essential hypertension  - quinapril-hydrochlorothiazide (ACCURETIC) 10-12.5 MG tablet; Take 1 tablet by mouth daily.  Dispense: 90 tablet; Refill: 3 - CBC with Differential/Platelet - TSH  2. Chronic seasonal allergic rhinitis due to pollen   3. Other seasonal allergic rhinitis  - montelukast (SINGULAIR) 10 MG tablet; Take 1 tablet (10 mg total) by mouth daily.  Dispense: 90 tablet; Refill: 3 - fluticasone (FLONASE) 50 MCG/ACT nasal spray; Place 1 spray into both nostrils daily as needed.  Dispense: 16 g; Refill: 12  4. Hyperglycemia/Prediabetes  - Hemoglobin A1c - Comprehensive metabolic panel 5.GI Intolerance to Levaquin    Patient seen and examined by Miguel Aschoff, MD, and note scribed by Renaldo Fiddler, CMA. I have done the exam and reviewed the above chart and it is accurate to the best of my knowledge. Development worker, community has been used in this note in any air is in the dictation or transcription are unintentional.  Wilhemena Durie, MD  [place]

## 2016-05-21 NOTE — Patient Instructions (Signed)
Kegel Exercises  The goal of Kegel exercises is to isolate and exercise your pelvic floor muscles. These muscles act as a hammock that supports the rectum, vagina, small intestine, and uterus. As the muscles weaken, the hammock sags and these organs are displaced from their normal positions. Kegel exercises can strengthen your pelvic floor muscles and help you to improve bladder and bowel control, improve sexual response, and help reduce many problems and some discomfort during pregnancy. Kegel exercises can be done anywhere and at any time.  HOW TO PERFORM KEGEL EXERCISES  1. Locate your pelvic floor muscles. To do this, squeeze (contract) the muscles that you use when you try to stop the flow of urine. You will feel a tightness in the vaginal area (women) and a tight lift in the rectal area (men and women).  2. When you begin, contract your pelvic muscles tight for 2-5 seconds, then relax them for 2-5 seconds. This is one set. Do 4-5 sets with a short pause in between.  3. Contract your pelvic muscles for 8-10 seconds, then relax them for 8-10 seconds. Do 4-5 sets. If you cannot contract your pelvic muscles for 8-10 seconds, try 5-7 seconds and work your way up to 8-10 seconds. Your goal is 4-5 sets of 10 contractions each day.  Keep your stomach, buttocks, and legs relaxed during the exercises. Perform sets of both short and long contractions. Vary your positions. Perform these contractions 3-4 times per day. Perform sets while you are:    · Lying in bed in the morning.  · Standing at lunch.  · Sitting in the late afternoon.  · Lying in bed at night.   You should do 40-50 contractions per day. Do not perform more Kegel exercises per day than recommended. Overexercising can cause muscle fatigue. Continue these exercises for for at least 15-20 weeks or as directed by your caregiver.     This information is not intended to replace advice given to you by your  health care provider. Make sure you discuss any questions you have with your health care provider.     Document Released: 03/11/2012 Document Revised: 04/15/2014 Document Reviewed: 02/12/2015  Elsevier Interactive Patient Education ©2017 [employer]

## 2016-05-22 LAB — CBC WITH DIFFERENTIAL/PLATELET
Basophils Absolute: 0 10*3/uL (ref 0.0–0.2)
Basos: 0 %
EOS (ABSOLUTE): 0.1 10*3/uL (ref 0.0–0.4)
Eos: 2 %
Hematocrit: 41 % (ref 34.0–46.6)
Hemoglobin: 13.8 g/dL (ref 11.1–15.9)
Immature Grans (Abs): 0 10*3/uL (ref 0.0–0.1)
Immature Granulocytes: 0 %
Lymphocytes Absolute: 1.8 10*3/uL (ref 0.7–3.1)
Lymphs: 35 %
MCH: 30.6 pg (ref 26.6–33.0)
MCHC: 33.7 g/dL (ref 31.5–35.7)
MCV: 91 fL (ref 79–97)
Monocytes Absolute: 0.4 10*3/uL (ref 0.1–0.9)
Monocytes: 8 %
Neutrophils Absolute: 2.9 10*3/uL (ref 1.4–7.0)
Neutrophils: 55 %
Platelets: 136 10*3/uL — ABNORMAL LOW (ref 150–379)
RBC: 4.51 x10E6/uL (ref 3.77–5.28)
RDW: 13.1 % (ref 12.3–15.4)
WBC: 5.2 10*3/uL (ref 3.4–10.8)

## 2016-05-22 LAB — COMPREHENSIVE METABOLIC PANEL
ALT: 20 IU/L (ref 0–32)
AST: 21 IU/L (ref 0–40)
Albumin/Globulin Ratio: 2 (ref 1.2–2.2)
Albumin: 4 g/dL (ref 3.6–4.8)
Alkaline Phosphatase: 150 IU/L — ABNORMAL HIGH (ref 39–117)
BUN/Creatinine Ratio: 22 (ref 12–28)
BUN: 13 mg/dL (ref 8–27)
Bilirubin Total: 0.4 mg/dL (ref 0.0–1.2)
CO2: 23 mmol/L (ref 18–29)
Calcium: 8.8 mg/dL (ref 8.7–10.3)
Chloride: 104 mmol/L (ref 96–106)
Creatinine, Ser: 0.6 mg/dL (ref 0.57–1.00)
GFR calc Af Amer: 113 mL/min/{1.73_m2} (ref >59)
GFR calc non Af Amer: 98 mL/min/{1.73_m2} (ref >59)
Globulin, Total: 2 g/dL (ref 1.5–4.5)
Glucose: 113 mg/dL — ABNORMAL HIGH (ref 65–99)
Potassium: 3.9 mmol/L (ref 3.5–5.2)
Sodium: 144 mmol/L (ref 134–144)
Total Protein: 6 g/dL (ref 6.0–8.5)

## 2016-05-22 LAB — HEMOGLOBIN A1C
Est. average glucose Bld gHb Est-mCnc: 111 mg/dL
Hgb A1c MFr Bld: 5.5 % (ref 4.8–5.6)

## 2016-05-22 LAB — TSH: TSH: 3.14 u[IU]/mL (ref 0.450–4.500)

## 2016-05-27 DIAGNOSIS — D229 Melanocytic nevi, unspecified: Secondary | ICD-10-CM | POA: Diagnosis not present

## 2016-05-27 DIAGNOSIS — D1800 Hemangioma unspecified site: Secondary | ICD-10-CM | POA: Diagnosis not present

## 2016-05-27 DIAGNOSIS — Z1283 Encounter for screening for malignant neoplasm of skin: Secondary | ICD-10-CM | POA: Diagnosis not present

## 2016-05-27 DIAGNOSIS — L820 Inflamed seborrheic keratosis: Secondary | ICD-10-CM | POA: Diagnosis not present

## 2016-05-27 DIAGNOSIS — L718 Other rosacea: Secondary | ICD-10-CM | POA: Diagnosis not present

## 2016-09-27 ENCOUNTER — Telehealth: Payer: Self-pay

## 2016-09-27 MED ORDER — ESTROGENS, CONJUGATED 0.625 MG/GM VA CREA: 1.0000 | TOPICAL_CREAM | VAGINAL | 2 refills | Status: DC

## 2016-09-27 NOTE — Telephone Encounter (Signed)
Pt aware refills sent eRx.

## 2016-09-27 NOTE — Telephone Encounter (Signed)
Pt requesting refill of Premarin sent to [place] that has changed to [facility] now. Her next AE is 04/2017.

## 2016-10-02 ENCOUNTER — Ambulatory Visit (INDEPENDENT_AMBULATORY_CARE_PROVIDER_SITE_OTHER): Payer: Federal, State, Local not specified - PPO | Admitting: Family Medicine

## 2016-10-02 VITALS — BP 140/80 | HR 80 | Resp 12 | Wt 162.0 lb

## 2016-10-02 DIAGNOSIS — I10 Essential (primary) hypertension: Secondary | ICD-10-CM

## 2016-10-02 DIAGNOSIS — M65341 Trigger finger, right ring finger: Secondary | ICD-10-CM

## 2016-10-02 DIAGNOSIS — J301 Allergic rhinitis due to pollen: Secondary | ICD-10-CM | POA: Diagnosis not present

## 2016-10-02 DIAGNOSIS — G5603 Carpal tunnel syndrome, bilateral upper limbs: Secondary | ICD-10-CM

## 2016-10-02 NOTE — Patient Instructions (Addendum)
Take Advil/Aleve 1 tablet in the evening for 2 weeks. Try cock up wrist splint for numbness/tingling issue with hands/wrists until the end of July.

## 2016-10-02 NOTE — Progress Notes (Signed)
Alyssa Thornton  MRN: [id number] DOB: May 13, 1955  Subjective:  HPI  Patient is here for follow up. Last office visit was on 05/21/16. Routine lab work was done at that time. Patient is checking her b/p and readings vary the highest can be 140/80s, sometimes 120s/80s. No cardiac symptoms. BP Readings from Last 3 Encounters:  10/02/16 140/80  05/21/16 118/62  03/22/16 (!) 148/88   Wt Readings from Last 3 Encounters:  10/02/16 162 lb (73.5 kg)  05/21/16 160 lb (72.6 kg)  03/22/16 161 lb (73 kg)   Patient would like to discuss some issue with her fingers-seems to be like trigger fingers, and also noticed her hands go numb at night. No pain anywhere or weakness.  Patient Active Problem List   Diagnosis Date Noted  . Abdominal pain 03/31/2015  . Allergic rhinitis 03/31/2015  . Hyperlipidemia 03/31/2015  . Accumulation of fluid in tissues 03/31/2015  . Fatty infiltration of liver 03/31/2015  . BP (high blood pressure) 03/31/2015  . Headache, migraine 03/31/2015  . Plantar fasciitis 03/31/2015  . Atypical hyperplasia of left breast 07/10/2013  . Breast calcification seen on mammogram 06/19/2013    Past Medical History:  Diagnosis Date  . Atypical hyperplasia of left breast 07/02/2013   Left breast papilloma, patient declined formal excision.  . Breast mass   . Fatty liver   . Hypertension     Social History   Social History  . Marital status: Married    Spouse name: N/A  . Number of children: N/A  . Years of education: N/A   Occupational History  . Not on file.   Social History Main Topics  . Smoking status: Never Smoker  . Smokeless tobacco: Never Used  . Alcohol use No  . Drug use: No  . Sexual activity: Not on file   Other Topics Concern  . Not on file   Social History Narrative  . No narrative on file    Outpatient Encounter Prescriptions as of 10/02/2016  Medication Sig Note  . aspirin 81 MG tablet Take 81 mg by mouth daily.   . Cholecalciferol  (VITAMIN D-3 PO) Take 4,000 Int'l Units by mouth 4 (four) times daily.   Marland Kitchen conjugated estrogens (PREMARIN) vaginal cream Place 1 Applicatorful vaginally 2 (two) times a week.   Marland Kitchen FINACEA 15 % cream  11/17/2013: Received from: External Pharmacy  . fluticasone (FLONASE) 50 MCG/ACT nasal spray Place 1 spray into both nostrils daily as needed.   . metroNIDAZOLE (METROGEL) 1 % gel Apply 1 application topically daily.   . montelukast (SINGULAIR) 10 MG tablet Take 1 tablet (10 mg total) by mouth daily.   . Multiple Vitamin (MULTIVITAMIN) tablet Take 1 tablet by mouth daily.   . quinapril-hydrochlorothiazide (ACCURETIC) 10-12.5 MG tablet Take 1 tablet by mouth daily.   . tamoxifen (NOLVADEX) 20 MG tablet take 1 tablet by mouth once daily   . vitamin C (ASCORBIC ACID) 500 MG tablet Take 500 mg by mouth daily.   . vitamin E (VITAMIN E) 400 UNIT capsule Take 400 Units by mouth daily.    No facility-administered encounter medications on file as of 10/02/2016.     Allergies  Allergen Reactions  . Diclofenac Sodium Diarrhea  . Tramadol Diarrhea  . Levaquin [Levofloxacin] Nausea And Vomiting    Review of Systems  Constitutional: Negative.   Eyes: Negative.   Respiratory: Negative.   Cardiovascular: Negative.   Gastrointestinal: Negative.   Musculoskeletal: Positive for joint pain.  Stiffness in fingers  Skin: Negative.   Neurological:       Numbness in the hands  Endo/Heme/Allergies: Negative.   Psychiatric/Behavioral: Negative.     Objective:  BP 140/80   Pulse 80   Resp 12   Wt 162 lb (73.5 kg)   BMI 28.70 kg/m   Physical Exam  Constitutional: She is oriented to person, place, and time and well-developed, well-nourished, and in no distress.  HENT:  Head: Normocephalic and atraumatic.  Right Ear: External ear normal.  Left Ear: External ear normal.  Nose: Nose normal.  Eyes: Conjunctivae are normal. Pupils are equal, round, and reactive to light. No scleral icterus.  Neck:  Normal range of motion. Neck supple. No thyromegaly present.  Cardiovascular: Normal rate, regular rhythm, normal heart sounds and intact distal pulses.  Exam reveals no gallop.   No murmur heard. Pulmonary/Chest: Effort normal and breath sounds normal. No respiratory distress. She has no wheezes.  Abdominal: Soft.  Neurological: She is alert and oriented to person, place, and time. Gait normal. GCS score is 15.  Skin: Skin is warm and dry.  Psychiatric: Mood, memory, affect and judgment normal.   Assessment and Plan :  1. Bilateral carpal tunnel syndrome Early stage I think. Advised patient to use cock up wrist splint. Discussed this in details. May need further work up. Follow as needed at this time. Also advised patient to take 1 tablet of Advil or Aleve for 2 weeks. May need to refer to Dr Laurence Slate surgeon in [place]. 2. Trigger ring finger of right hand 3. Essential hypertension Stable. Continue current medication. 4. Chronic seasonal allergic rhinitis due to pollen Stable. HPI, Exam and A&P transcribed by Theressa Millard, RMA under direction and in the presence of Miguel Aschoff, MD. I have done the exam and reviewed the chart and it is accurate to the best of my knowledge. Development worker, community has been used and  any errors in dictation or transcription are unintentional. Miguel Aschoff M.D. [facility]

## 2016-12-17 ENCOUNTER — Ambulatory Visit (INDEPENDENT_AMBULATORY_CARE_PROVIDER_SITE_OTHER): Payer: Federal, State, Local not specified - PPO | Admitting: Family Medicine

## 2016-12-17 VITALS — BP 114/72 | HR 76 | Temp 97.5°F | Resp 16 | Wt 158.0 lb

## 2016-12-17 DIAGNOSIS — G5603 Carpal tunnel syndrome, bilateral upper limbs: Secondary | ICD-10-CM

## 2016-12-17 DIAGNOSIS — E785 Hyperlipidemia, unspecified: Secondary | ICD-10-CM

## 2016-12-17 DIAGNOSIS — I10 Essential (primary) hypertension: Secondary | ICD-10-CM

## 2016-12-17 LAB — COMPLETE METABOLIC PANEL WITH GFR
AG Ratio: 2 (calc) (ref 1.0–2.5)
ALT: 22 U/L (ref 6–29)
AST: 22 U/L (ref 10–35)
Albumin: 4 g/dL (ref 3.6–5.1)
Alkaline phosphatase (APISO): 137 U/L — ABNORMAL HIGH (ref 33–130)
BUN: 12 mg/dL (ref 7–25)
CO2: 26 mmol/L (ref 20–32)
Calcium: 8.9 mg/dL (ref 8.6–10.4)
Chloride: 105 mmol/L (ref 98–110)
Creat: 0.59 mg/dL (ref 0.50–0.99)
GFR, Est African American: 113 mL/min/{1.73_m2} (ref >=60)
GFR, Est Non African American: 98 mL/min/{1.73_m2} (ref >=60)
Globulin: 2 g/dL (calc) (ref 1.9–3.7)
Glucose, Bld: 120 mg/dL — ABNORMAL HIGH (ref 65–99)
Potassium: 4 mmol/L (ref 3.5–5.3)
Sodium: 140 mmol/L (ref 135–146)
Total Bilirubin: 0.6 mg/dL (ref 0.2–1.2)
Total Protein: 6 g/dL — ABNORMAL LOW (ref 6.1–8.1)

## 2016-12-17 LAB — LIPID PANEL
Cholesterol: 145 mg/dL (ref <200)
HDL: 58 mg/dL (ref >50)
LDL Cholesterol (Calc): 71 mg/dL (calc)
Non-HDL Cholesterol (Calc): 87 mg/dL (calc) (ref <130)
Total CHOL/HDL Ratio: 2.5 (calc) (ref <5.0)
Triglycerides: 84 mg/dL (ref <150)

## 2016-12-17 NOTE — Assessment & Plan Note (Signed)
Symptomatic Conservative measures don't seem to be helping Referral to hand surgery

## 2016-12-17 NOTE — Assessment & Plan Note (Signed)
No meds Recheck lipid panel

## 2016-12-17 NOTE — Progress Notes (Signed)
hy    Patient: Alyssa Thornton Female    DOB: [birth year]   61 y.o.   MRN: [id number] Visit Date: 12/17/2016  Today's Provider: Lavon Paganini, MD   Chief Complaint  Patient presents with  . Hypertension   Subjective:    HPI      Hypertension, follow-up:  BP Readings from Last 3 Encounters:  12/17/16 114/72  10/02/16 140/80  05/21/16 118/62    She was last seen for hypertension 2 months ago.  BP at that visit was 140/80. Management since that visit includes continuing Quinapril-HCTZ 10-12.5 mg po qd. She reports good compliance with treatment. She is not having side effects.  She is exercising. Walks. She is adherent to low salt diet.   Outside blood pressures are getting higher per pt. Her BP has been as high as 157/101. She is experiencing headache.  Patient denies chest pain, chest pressure/discomfort, claudication, dyspnea, exertional chest pressure/discomfort, fatigue, irregular heart beat, lower extremity edema, near-syncope, orthopnea, palpitations and syncope.   Cardiovascular risk factors include hypertension.  Use of agents associated with hypertension: estrogens.     Weight trend: fluctuating a bit Wt Readings from Last 3 Encounters:  12/17/16 158 lb (71.7 kg)  10/02/16 162 lb (73.5 kg)  05/21/16 160 lb (72.6 kg)    Current diet: in general, an "unhealthy" diet. States she has to eat "a lot of fast food at work".  ------------------------------------------------------------------------  Follow up for Bilateral Carpal Tunnel Syndrome  The patient was last seen for this 2 months ago. Changes made at last visit include advising pt to use cock up splint, and using NSAIDs x 2 weeks.  She reports excellent compliance with treatment. She feels that condition is Unchanged. She is c/o numbness. She denies pain. Pt believes she will need surgery for this. She is also c/o trigger finger on her right third  finger.  ------------------------------------------------------------------------------------ HLD: Diet and exercise as above - no meds currently - does take fish oil   Allergies  Allergen Reactions  . Diclofenac Sodium Diarrhea  . Tramadol Diarrhea  . Levaquin [Levofloxacin] Nausea And Vomiting     Current Outpatient Prescriptions:  .  aspirin 81 MG tablet, Take 81 mg by mouth daily., Disp: , Rfl:  .  Cholecalciferol (VITAMIN D-3 PO), Take 4,000 Int'l Units by mouth 4 (four) times daily., Disp: , Rfl:  .  conjugated estrogens (PREMARIN) vaginal cream, Place 1 Applicatorful vaginally 2 (two) times a week., Disp: 42.5 g, Rfl: 2 .  FINACEA 15 % cream, , Disp: , Rfl:  .  fluticasone (FLONASE) 50 MCG/ACT nasal spray, Place 1 spray into both nostrils daily as needed., Disp: 16 g, Rfl: 12 .  metroNIDAZOLE (METROGEL) 1 % gel, Apply 1 application topically daily., Disp: , Rfl:  .  montelukast (SINGULAIR) 10 MG tablet, Take 1 tablet (10 mg total) by mouth daily., Disp: 90 tablet, Rfl: 3 .  Multiple Vitamin (MULTIVITAMIN) tablet, Take 1 tablet by mouth daily., Disp: , Rfl:  .  Omega-3 Fatty Acids (FISH OIL OMEGA-3 PO), Take by mouth., Disp: , Rfl:  .  quinapril-hydrochlorothiazide (ACCURETIC) 10-12.5 MG tablet, Take 1 tablet by mouth daily., Disp: 90 tablet, Rfl: 3 .  tamoxifen (NOLVADEX) 20 MG tablet, take 1 tablet by mouth once daily, Disp: 90 tablet, Rfl: 1 .  vitamin C (ASCORBIC ACID) 500 MG tablet, Take 500 mg by mouth daily., Disp: , Rfl:  .  vitamin E (VITAMIN E) 400 UNIT capsule, Take 400 Units by mouth daily., Disp: ,  Rfl:   Review of Systems  Constitutional: Negative for activity change, appetite change, chills, diaphoresis, fatigue, fever and unexpected weight change.  HENT: Negative.   Eyes: Negative.   Respiratory: Negative for chest tightness and shortness of breath.   Cardiovascular: Negative for chest pain, palpitations and leg swelling.  Gastrointestinal: Negative.    Genitourinary: Negative.   Musculoskeletal: Negative.   Neurological: Positive for weakness and numbness. Negative for dizziness.  Psychiatric/Behavioral: Negative.     Social History  Substance Use Topics  . Smoking status: Never Smoker  . Smokeless tobacco: Never Used  . Alcohol use No   Objective:   BP 114/72 (BP Location: Left Arm, Patient Position: Sitting, Cuff Size: Normal)   Pulse 76   Temp (!) 97.5 F (36.4 C) (Oral)   Resp 16   Wt 158 lb (71.7 kg)   BMI 27.99 kg/m  Vitals:   12/17/16 0804  BP: 114/72  Pulse: 76  Resp: 16  Temp: (!) 97.5 F (36.4 C)  TempSrc: Oral  Weight: 158 lb (71.7 kg)     Physical Exam  Constitutional: She is oriented to person, place, and time. She appears well-developed and well-nourished. No distress.  HENT:  Head: Normocephalic and atraumatic.  Right Ear: External ear normal.  Left Ear: External ear normal.  Nose: Nose normal.  Eyes: Conjunctivae are normal. No scleral icterus.  Cardiovascular: Normal rate, regular rhythm, normal heart sounds and intact distal pulses.   No murmur heard. Pulmonary/Chest: Effort normal and breath sounds normal. No respiratory distress. She has no wheezes.  Abdominal: Soft. Bowel sounds are normal. She exhibits no distension. There is no tenderness. There is no rebound and no guarding.  Musculoskeletal: She exhibits no edema.       Right wrist: She exhibits normal range of motion and no bony tenderness.       Left wrist: She exhibits normal range of motion and no tenderness.  B/l wrists: Negative Tinels, +Phalens, intact radial pulses  Neurological: She is alert and oriented to person, place, and time.  Skin: Skin is warm and dry. No rash noted.  Psychiatric: She has a normal mood and affect. Her behavior is normal.  Vitals reviewed.      Assessment & Plan:      Problem List Items Addressed This Visit      Cardiovascular and Mediastinum   Essential hypertension - Primary    Well  controlled today Check CMP Continue current meds F/u in 3 months Keep log of home BPs - if elevated f/u sooner      Relevant Orders   COMPLETE METABOLIC PANEL WITH GFR     Nervous and Auditory   Bilateral carpal tunnel syndrome    Symptomatic Conservative measures don't seem to be helping Referral to hand surgery      Relevant Orders   Ambulatory referral to Hand Surgery     Other   Hyperlipidemia    No meds Recheck lipid panel      Relevant Orders   COMPLETE METABOLIC PANEL WITH GFR   Lipid panel         The entirety of the information documented in the History of Present Illness, Review of Systems and Physical Exam were personally obtained by me. Portions of this information were initially documented by Raquel Sarna Ratchford, CMA and reviewed by me for thoroughness and accuracy.     Lavon Paganini, MD  [facility]

## 2016-12-17 NOTE — Assessment & Plan Note (Addendum)
Well controlled today Check CMP Continue current meds F/u in 3 months Keep log of home BPs - if elevated f/u sooner

## 2016-12-17 NOTE — Patient Instructions (Signed)

## 2016-12-18 ENCOUNTER — Telehealth: Payer: Self-pay

## 2016-12-18 NOTE — Telephone Encounter (Signed)
-----   Message from Virginia Crews, MD sent at 12/18/2016  2:06 PM EDT ----- Normal kidney function, liver function, electrolytes, cholesterol.  Virginia Crews, MD, MPH [facility] 12/18/2016 2:06 PM

## 2016-12-18 NOTE — Telephone Encounter (Signed)
Left message advising pt. OK per DPR. 

## 2016-12-19 ENCOUNTER — Telehealth: Payer: Self-pay

## 2016-12-19 NOTE — Telephone Encounter (Signed)
Patient has a Ortho referral placed. She called back to request referral be sent to [place]. Dr. Benedetto Coons.

## 2016-12-19 NOTE — Telephone Encounter (Signed)
Please review

## 2016-12-19 NOTE — Telephone Encounter (Signed)
Sarah,  Can we change the referral to Dr. Benedetto Coons at [place]? Thanks!  Virginia Crews, MD, MPH [facility] 12/19/2016 4:41 PM

## 2017-01-06 ENCOUNTER — Encounter: Payer: Self-pay | Admitting: Obstetrics and Gynecology

## 2017-02-05 ENCOUNTER — Telehealth: Payer: Self-pay | Admitting: Family Medicine

## 2017-02-05 DIAGNOSIS — G5601 Carpal tunnel syndrome, right upper limb: Secondary | ICD-10-CM | POA: Diagnosis not present

## 2017-02-05 DIAGNOSIS — M65342 Trigger finger, left ring finger: Secondary | ICD-10-CM | POA: Diagnosis not present

## 2017-02-05 DIAGNOSIS — G5602 Carpal tunnel syndrome, left upper limb: Secondary | ICD-10-CM | POA: Diagnosis not present

## 2017-02-05 DIAGNOSIS — M65341 Trigger finger, right ring finger: Secondary | ICD-10-CM | POA: Diagnosis not present

## 2017-02-05 DIAGNOSIS — M79642 Pain in left hand: Secondary | ICD-10-CM | POA: Diagnosis not present

## 2017-02-05 DIAGNOSIS — M79641 Pain in right hand: Secondary | ICD-10-CM | POA: Diagnosis not present

## 2017-02-05 DIAGNOSIS — G5603 Carpal tunnel syndrome, bilateral upper limbs: Secondary | ICD-10-CM | POA: Diagnosis not present

## 2017-02-05 NOTE — Telephone Encounter (Signed)
Pt was referred to Orthopedic and pt was issues a B6 vit for trigger finger .  Pt states she wanted to know if it is okay to take because she has a fatty liver.

## 2017-02-06 NOTE — Telephone Encounter (Signed)
From chart review your hepatic steatosis is mild and liver enzymes and cholesterol were ok at last check. It would be ok to take B6 for a short time, but I would not recommend long term. You can cut back your dietary red meat intake while taking the B6 supplement to protect liver.

## 2017-02-06 NOTE — Telephone Encounter (Signed)
Please review

## 2017-02-06 NOTE — Telephone Encounter (Signed)
Pt advised-Anastasiya V Hopkins, RMA  

## 2017-02-06 NOTE — Telephone Encounter (Signed)
lmtcb-Anastasiya V Hopkins, RMA  

## 2017-02-12 ENCOUNTER — Ambulatory Visit: Payer: Federal, State, Local not specified - PPO | Admitting: General Surgery

## 2017-02-12 DIAGNOSIS — Z1231 Encounter for screening mammogram for malignant neoplasm of breast: Secondary | ICD-10-CM | POA: Diagnosis not present

## 2017-02-14 ENCOUNTER — Encounter: Payer: Self-pay | Admitting: General Surgery

## 2017-02-17 ENCOUNTER — Ambulatory Visit: Payer: Federal, State, Local not specified - PPO | Admitting: General Surgery

## 2017-02-17 ENCOUNTER — Encounter: Payer: Self-pay | Admitting: General Surgery

## 2017-02-17 VITALS — BP 130/76 | HR 80 | Resp 15 | Ht 63.0 in | Wt 156.0 lb

## 2017-02-17 DIAGNOSIS — R928 Other abnormal and inconclusive findings on diagnostic imaging of breast: Secondary | ICD-10-CM | POA: Diagnosis not present

## 2017-02-17 NOTE — Patient Instructions (Signed)
Go ahead and have the added views done. If normal will follow up in one year.

## 2017-02-17 NOTE — Progress Notes (Signed)
Patient ID: Alyssa Thornton, female   DOB: 28-Feb-1956, 61 y.o.   MRN: [id number]  Chief Complaint  Patient presents with  . Follow-up    mammogram    HPI Alyssa Thornton is a 61 y.o. female who presents for a breast evaluation. The most recent mammogram was done on 02/12/17. Patient does perform regular self breast checks and gets regular mammograms done. Additional views were recommended. She has no new breast complaints.  She has been having problems with carpal tunnel and trigger fingers.    HPI  Past Medical History:  Diagnosis Date  . Atypical hyperplasia of left breast 07/02/2013   1 mm area of atypia associated with a calcified papilloma.  . Fatty liver   . Hypertension     Past Surgical History:  Procedure Laterality Date  . bartholins gland surgery    . BREAST BIOPSY Left 11/15/2005   LEFT BREAST, CORE BIOPSY:: STROMAL SCLEROSIS WITHIN BENIGN ATROPHIC BREAST TISSUE  . BREAST BIOPSY Left July 02, 2013   left breast papilloma with focal atypia  . COLONOSCOPY  2007-2008, 2015   Dr Byrnett/Dr Allen Norris  . TONSILLECTOMY AND ADENOIDECTOMY  1960    Family History  Problem Relation Age of Onset  . Heart disease Father   . Cancer Sister        bone marrow cancer  . Hypercholesterolemia Brother   . Cancer - Ovarian Maternal Grandmother        90    Social History Social History   Tobacco Use  . Smoking status: Never Smoker  . Smokeless tobacco: Never Used  Substance Use Topics  . Alcohol use: No  . Drug use: No    Allergies  Allergen Reactions  . Diclofenac Sodium Diarrhea  . Tramadol Diarrhea  . Levaquin [Levofloxacin] Nausea And Vomiting    Current Outpatient Medications  Medication Sig Dispense Refill  . aspirin 81 MG tablet Take 81 mg by mouth daily.    . Cholecalciferol (VITAMIN D-3 PO) Take 4,000 Int'l Units by mouth 4 (four) times daily.    Marland Kitchen conjugated estrogens (PREMARIN) vaginal cream Place 1 Applicatorful vaginally 2 (two) times a week. 42.5 g 2  .  FINACEA 15 % cream     . fluticasone (FLONASE) 50 MCG/ACT nasal spray Place 1 spray into both nostrils daily as needed. 16 g 12  . metroNIDAZOLE (METROGEL) 1 % gel Apply 1 application topically daily.    . montelukast (SINGULAIR) 10 MG tablet Take 1 tablet (10 mg total) by mouth daily. 90 tablet 3  . Multiple Vitamin (MULTIVITAMIN) tablet Take 1 tablet by mouth daily.    . Omega-3 Fatty Acids (FISH OIL OMEGA-3 PO) Take by mouth.    . quinapril-hydrochlorothiazide (ACCURETIC) 10-12.5 MG tablet Take 1 tablet by mouth daily. 90 tablet 3  . RA VITAMIN B-6 100 MG tablet Take 100 mg daily by mouth.  0  . tamoxifen (NOLVADEX) 20 MG tablet take 1 tablet by mouth once daily 90 tablet 1  . vitamin C (ASCORBIC ACID) 500 MG tablet Take 500 mg by mouth daily.    . vitamin E (VITAMIN E) 400 UNIT capsule Take 400 Units by mouth daily.     No current facility-administered medications for this visit.     Review of Systems Review of Systems  Constitutional: Negative.   Respiratory: Negative.   Cardiovascular: Negative.     Blood pressure 130/76, pulse 80, resp. rate 15, height 5\' 3"  (1.6 m), weight 156 lb (70.8 kg).  Physical Exam Physical Exam  Constitutional: She is oriented to person, place, and time. She appears well-developed and well-nourished.  Eyes: Conjunctivae are normal. No scleral icterus.  Neck: Neck supple.  Cardiovascular: Normal rate, regular rhythm and normal heart sounds.  Pulmonary/Chest: Effort normal and breath sounds normal. Right breast exhibits no inverted nipple, no mass, no nipple discharge, no skin change and no tenderness. Left breast exhibits no inverted nipple, no mass, no nipple discharge, no skin change and no tenderness.    Lymphadenopathy:    She has no cervical adenopathy.    She has no axillary adenopathy.  Neurological: She is alert and oriented to person, place, and time.  Skin: Skin is warm and dry.  Psychiatric: She has a normal mood and affect.    Data  Reviewed 06/22/2013 core biopsy: Diagnosis:  Part A: LEFT BREAST MASS AND CALCS:  - PAPILLARY NEOPLASM WITH ATYPIA AND ASSOCIATED  MICROCALCIFICATIONS.  .  Part B: LEFT BREAST CALCS:  - PAPILLARY NEOPLASM WITHOUT ATYPIA IN THIS SAMPLE AND ASSOCIATED  MICROCALCIFICATIONS.  - MICROCALCIFICATIONS IN BENIGN BREAST EPITHELIUM, SEE COMMENT.  Marland Kitchen  07/02/2013 wide excision: Diagnosis:  LEFT BREAST:  - COMPLEX SCLEROSING LESION WITH CALCIFICATIONS.  - ADJACENT BIOPSY SITE WITH RESIDUAL INTRADUCTAL PAPILLOMA AND  SINGLE 1 MM FOCUS OF ATYPIA, SEE COMMENT.  - THE MARGINS OF EXCISION ARE NEGATIVE FOR ATYPIA.  - NEGATIVE FOR MALIGNANCY.  Marland Kitchen  COMMENT: The majority of the papilloma with atypia is present  in the prior biopsy [phone], part A. Selected slides were  reviewed under intradepartmental consultation. The entire  specimen was submitted for microscopic evaluation.    [facility] the [facility]. Additional views were requested. BIRAD-0.  Assessment    Modest change in left breast mammogram now 3.5 years after excision of a papilloma with a 1 mm foci of atypia.    Plan    Recommend to have added views completed patient desired to have these completed locally rather than traveling to [facility].       HPI, Physical Exam, Assessment and Plan have been scribed under the direction and in the presence of Robert Bellow, MD  Concepcion Living, LPN  I have completed the exam and reviewed the above documentation for accuracy and completeness.  I agree with the above.  Haematologist has been used and any errors in dictation or transcription are unintentional.  Hervey Ard, M.D., F.A.C.S.  Robert Bellow 02/17/2017, 8:12 PM  Patient to have additional views completed at the [facility]. She is not wanting to drive to [facility] to have this done. The patient is aware to sign a release at [facility] so they can get previous records from [facility].   Dominga Ferry, CMA

## 2017-02-19 ENCOUNTER — Inpatient Hospital Stay
Admission: RE | Admit: 2017-02-19 | Discharge: 2017-02-19 | Disposition: A | Discharge status: Home / Self Care | Payer: Self-pay | Source: Ambulatory Visit | Attending: *Deleted | Admitting: *Deleted

## 2017-02-19 ENCOUNTER — Other Ambulatory Visit: Payer: Self-pay | Admitting: *Deleted

## 2017-02-19 DIAGNOSIS — Z9289 Personal history of other medical treatment: Secondary | ICD-10-CM

## 2017-03-03 DIAGNOSIS — M65341 Trigger finger, right ring finger: Secondary | ICD-10-CM | POA: Diagnosis not present

## 2017-03-11 ENCOUNTER — Ambulatory Visit
Admission: RE | Admit: 2017-03-11 | Discharge: 2017-03-11 | Disposition: A | Discharge status: Home / Self Care | Payer: Federal, State, Local not specified - PPO | Source: Ambulatory Visit | Attending: General Surgery | Admitting: General Surgery

## 2017-03-11 ENCOUNTER — Ambulatory Visit: Payer: Federal, State, Local not specified - PPO | Admitting: Family Medicine

## 2017-03-11 ENCOUNTER — Encounter: Payer: Self-pay | Admitting: Family Medicine

## 2017-03-11 VITALS — BP 116/66 | HR 74 | Temp 97.9°F | Resp 16 | Ht 63.0 in | Wt 152.0 lb

## 2017-03-11 DIAGNOSIS — R928 Other abnormal and inconclusive findings on diagnostic imaging of breast: Secondary | ICD-10-CM | POA: Diagnosis not present

## 2017-03-11 DIAGNOSIS — I10 Essential (primary) hypertension: Secondary | ICD-10-CM

## 2017-03-11 DIAGNOSIS — R922 Inconclusive mammogram: Secondary | ICD-10-CM | POA: Diagnosis not present

## 2017-03-11 NOTE — Assessment & Plan Note (Signed)
Well controlled today Recent CMP reviewed Continue current meds Will RTC to check BP cuff against ours F/u in 6 months or sooner prn

## 2017-03-11 NOTE — Progress Notes (Signed)
Patient: Alyssa Thornton Female    DOB: 02-21-1956   61 y.o.   MRN: [id number] Visit Date: 03/11/2017  Today's Provider: Lavon Paganini, MD   Chief Complaint  Patient presents with  . Hypertension   Subjective:    HPI  Follow up for hypertension  The patient was last seen for this 3 months ago. Changes made at last visit include no changes.  She reports excellent compliance with treatment. She feels that condition is Unchanged. She is not having side effects.  Patient denies any chest pain, shortness of breath or swelling around ankles. Patient reports following low salt diet and walks at least 7 miles at work. [employer].   Home BPs range from 130s-150s/80-90s.  States cuff is arm cuff that plugs in and only 63 years old.  She has not had this checked against manual BP cuff ------------------------------------------------------------------------------------     Allergies  Allergen Reactions  . Diclofenac Sodium Diarrhea  . Tramadol Diarrhea  . Levaquin [Levofloxacin] Nausea And Vomiting     Current Outpatient Medications:  .  aspirin 81 MG tablet, Take 81 mg by mouth daily., Disp: , Rfl:  .  Cholecalciferol (VITAMIN D-3 PO), Take 4,000 Int'l Units by mouth 4 (four) times daily., Disp: , Rfl:  .  conjugated estrogens (PREMARIN) vaginal cream, Place 1 Applicatorful vaginally 2 (two) times a week., Disp: 42.5 g, Rfl: 2 .  FINACEA 15 % cream, , Disp: , Rfl:  .  fluticasone (FLONASE) 50 MCG/ACT nasal spray, Place 1 spray into both nostrils daily as needed., Disp: 16 g, Rfl: 12 .  metroNIDAZOLE (METROGEL) 1 % gel, Apply 1 application topically daily., Disp: , Rfl:  .  montelukast (SINGULAIR) 10 MG tablet, Take 1 tablet (10 mg total) by mouth daily., Disp: 90 tablet, Rfl: 3 .  Multiple Vitamin (MULTIVITAMIN) tablet, Take 1 tablet by mouth daily., Disp: , Rfl:  .  quinapril-hydrochlorothiazide (ACCURETIC) 10-12.5 MG tablet, Take 1 tablet by mouth daily., Disp: 90  tablet, Rfl: 3 .  RA VITAMIN B-6 100 MG tablet, Take 100 mg daily by mouth., Disp: , Rfl: 0 .  tamoxifen (NOLVADEX) 20 MG tablet, take 1 tablet by mouth once daily, Disp: 90 tablet, Rfl: 1 .  vitamin C (ASCORBIC ACID) 500 MG tablet, Take 500 mg by mouth daily., Disp: , Rfl:  .  vitamin E (VITAMIN E) 400 UNIT capsule, Take 400 Units by mouth daily., Disp: , Rfl:  .  Omega-3 Fatty Acids (FISH OIL OMEGA-3 PO), Take by mouth., Disp: , Rfl:   Review of Systems  Constitutional: Negative.   HENT: Negative.   Respiratory: Negative.   Cardiovascular: Negative.   Genitourinary: Negative.     Social History   Tobacco Use  . Smoking status: Never Smoker  . Smokeless tobacco: Never Used  Substance Use Topics  . Alcohol use: No   Objective:   BP 116/66 (BP Location: Left Arm, Patient Position: Sitting, Cuff Size: Normal)   Pulse 74   Temp 97.9 F (36.6 C) (Oral)   Resp 16   Ht 5\' 3"  (1.6 m)   Wt 152 lb (68.9 kg)   SpO2 98%   BMI 26.93 kg/m  Vitals:   03/11/17 0809  BP: 116/66  Pulse: 74  Resp: 16  Temp: 97.9 F (36.6 C)  TempSrc: Oral  SpO2: 98%  Weight: 152 lb (68.9 kg)  Height: 5\' 3"  (1.6 m)     Physical Exam  Constitutional: She is oriented  to person, place, and time. She appears well-developed and well-nourished. No distress.  HENT:  Head: Normocephalic and atraumatic.  Eyes: Conjunctivae are normal. No scleral icterus.  Cardiovascular: Normal rate, regular rhythm, normal heart sounds and intact distal pulses.  No murmur heard. Pulmonary/Chest: Effort normal and breath sounds normal. No respiratory distress. She has no wheezes. She has no rales.  Musculoskeletal: She exhibits no edema.  Neurological: She is alert and oriented to person, place, and time.  Vitals reviewed.       Assessment & Plan:      Problem List Items Addressed This Visit      Cardiovascular and Mediastinum   Essential hypertension - Primary    Well controlled today Recent CMP  reviewed Continue current meds Will RTC to check BP cuff against ours F/u in 6 months or sooner prn         Return in about 6 months (around 09/09/2017) for BP f/u.      The entirety of the information documented in the History of Present Illness, Review of Systems and Physical Exam were personally obtained by me. Portions of this information were initially documented by Lynford Humphrey, CMA and reviewed by me for thoroughness and accuracy.     Lavon Paganini, MD  [facility]

## 2017-03-11 NOTE — Patient Instructions (Signed)
Let us check your blood pressure cuff against ours

## 2017-03-12 NOTE — Progress Notes (Signed)
Patient comes in today for a BP check. She feels well with no other complaints. She reports that she has had her BP machine for about 4-5 years, and has been getting readings at home 20+ points higher than her "normal". She brings it in today to calibrate with ours.    Our cuff: 132/60 Patient's cuff: 137/84

## 2017-04-22 ENCOUNTER — Encounter: Payer: Self-pay | Admitting: Obstetrics and Gynecology

## 2017-04-22 ENCOUNTER — Ambulatory Visit (INDEPENDENT_AMBULATORY_CARE_PROVIDER_SITE_OTHER): Payer: Federal, State, Local not specified - PPO | Admitting: Obstetrics and Gynecology

## 2017-04-22 VITALS — BP 140/90 | HR 85 | Ht 63.0 in | Wt 154.0 lb

## 2017-04-22 DIAGNOSIS — Z1231 Encounter for screening mammogram for malignant neoplasm of breast: Secondary | ICD-10-CM | POA: Diagnosis not present

## 2017-04-22 DIAGNOSIS — N952 Postmenopausal atrophic vaginitis: Secondary | ICD-10-CM | POA: Diagnosis not present

## 2017-04-22 DIAGNOSIS — Z1151 Encounter for screening for human papillomavirus (HPV): Secondary | ICD-10-CM

## 2017-04-22 DIAGNOSIS — Z01419 Encounter for gynecological examination (general) (routine) without abnormal findings: Secondary | ICD-10-CM | POA: Diagnosis not present

## 2017-04-22 DIAGNOSIS — Z124 Encounter for screening for malignant neoplasm of cervix: Secondary | ICD-10-CM | POA: Diagnosis not present

## 2017-04-22 DIAGNOSIS — Z1239 Encounter for other screening for malignant neoplasm of breast: Secondary | ICD-10-CM

## 2017-04-22 MED ORDER — ESTROGENS, CONJUGATED 0.625 MG/GM VA CREA: TOPICAL_CREAM | VAGINAL | 2 refills | Status: DC

## 2017-04-22 NOTE — Patient Instructions (Signed)
I value your feedback and entrusting us with your care. If you get a [facility] patient survey, I would appreciate you taking the time to let us know about your experience today. Thank you! 

## 2017-04-22 NOTE — Progress Notes (Signed)
PCP: Virginia Crews, MD   Chief Complaint  Patient presents with  . Gynecologic Exam    HPI:      [name] is a 62 y.o. G0P0000 who LMP was No LMP recorded. Patient is postmenopausal., presents today for her annual examination.  Her menses are absent due to menopause. She does not have intermenstrual bleeding.  She does not have vasomotor sx. Sex activity: single partner, contraception - post menopausal status. She does have vaginal dryness. Uses premarin vag crm with sx relief.  Last Pap: January 17, 2014  Results were: no abnormalities /neg HPV DNA.  Hx of STDs: none  Last mammogram: March 11, 2017  Results were: normal--routine follow-up in 12 months. Followed by Dr. Bary Castilla. On tamoxifen for 5 yrs due to LT breast atypical hyperplasia 2014. There is no FH of breast cancer. There is a FH of ovarian cancer in her MGM, but pt doesn't meet genetic testing guidelines with her [employer]. The patient does not do self-breast exams.  Colonoscopy: current, with Dr. Bary Castilla.  DEXA: 2018--stable osteopenia in spine/hip.  Tobacco use: The patient denies current or previous tobacco use. Alcohol use: none Exercise: very active  She does get adequate calcium and Vitamin D in her diet.  Labs with PCP.   Past Medical History:  Diagnosis Date  . Atypical hyperplasia of left breast 07/02/2013   1 mm area of atypia associated with a calcified papilloma.  . Fatty liver   . Hypertension   . Osteopenia   . Postmenopausal atrophic vaginitis     Past Surgical History:  Procedure Laterality Date  . bartholins gland surgery    . BREAST BIOPSY Left 11/15/2005   LEFT BREAST, CORE BIOPSY:: STROMAL SCLEROSIS WITHIN BENIGN ATROPHIC BREAST TISSUE  . BREAST BIOPSY Left July 02, 2013   left breast papilloma with focal atypia  . BREAST EXCISIONAL BIOPSY Left 2015  . COLONOSCOPY  2007-2008, 2015   Dr Byrnett/Dr Allen Norris  . TONSILLECTOMY AND ADENOIDECTOMY  1960     Family History  Problem Relation Age of Onset  . Heart disease Father   . Cancer Sister        bone marrow cancer  . Hypercholesterolemia Brother   . Cancer - Ovarian Maternal Grandmother        90  . Breast cancer Neg Hx     Social History   Socioeconomic History  . Marital status: Married    Spouse name: Not on file  . Number of children: Not on file  . Years of education: Not on file  . Highest education level: Not on file  Social Needs  . Financial resource strain: Not on file  . Food insecurity - worry: Not on file  . Food insecurity - inability: Not on file  . Transportation needs - medical: Not on file  . Transportation needs - non-medical: Not on file  Occupational History  . Not on file  Tobacco Use  . Smoking status: Never Smoker  . Smokeless tobacco: Never Used  Substance and Sexual Activity  . Alcohol use: No  . Drug use: No  . Sexual activity: Yes  Other Topics Concern  . Not on file  Social History Narrative  . Not on file    No outpatient medications have been marked as taking for the 04/22/17 encounter (Office Visit) with Adams Hinch B, PA-C.      ROS:  Review of Systems  Constitutional: Negative for fatigue, fever and unexpected  weight change.  Respiratory: Negative for cough, shortness of breath and wheezing.   Cardiovascular: Negative for chest pain, palpitations and leg swelling.  Gastrointestinal: Negative for blood in stool, constipation, diarrhea, nausea and vomiting.  Endocrine: Negative for cold intolerance, heat intolerance and polyuria.  Genitourinary: Negative for dyspareunia, dysuria, flank pain, frequency, genital sores, hematuria, menstrual problem, pelvic pain, urgency, vaginal bleeding, vaginal discharge and vaginal pain.  Musculoskeletal: Negative for back pain, joint swelling and myalgias.  Skin: Negative for rash.  Neurological: Negative for dizziness, syncope, light-headedness, numbness and headaches.   Hematological: Negative for adenopathy.  Psychiatric/Behavioral: Negative for agitation, confusion, sleep disturbance and suicidal ideas. The patient is not nervous/anxious.      Objective: BP 140/90   Pulse 85   Ht 5\' 3"  (1.6 m)   Wt 154 lb (69.9 kg)   BMI 27.28 kg/m    Physical Exam  Constitutional: She is oriented to person, place, and time. She appears well-developed and well-nourished.  Genitourinary: Vagina normal and uterus normal. There is no rash or tenderness on the right labia. There is no rash or tenderness on the left labia. No erythema or tenderness in the vagina. No vaginal discharge found. Right adnexum does not display mass and does not display tenderness. Left adnexum does not display mass and does not display tenderness. Cervix does not exhibit motion tenderness or polyp. Uterus is not enlarged or tender.  Neck: Normal range of motion. No thyromegaly present.  Cardiovascular: Normal rate, regular rhythm and normal heart sounds.  No murmur heard. Pulmonary/Chest: Effort normal and breath sounds normal. Right breast exhibits no mass, no nipple discharge, no skin change and no tenderness. Left breast exhibits no mass, no nipple discharge, no skin change and no tenderness.  Abdominal: Soft. There is no tenderness. There is no guarding.  Musculoskeletal: Normal range of motion.  Neurological: She is alert and oriented to person, place, and time. No cranial nerve deficit.  Psychiatric: She has a normal mood and affect. Her behavior is normal.  Vitals reviewed.   Assessment/Plan:  Encounter for annual routine gynecological examination  Cervical cancer screening - Plan: IGP, Aptima HPV  Screening for HPV (human papillomavirus) - Plan: IGP, Aptima HPV  Postmenopausal atrophic vaginitis - Rx RF prem vag crm, add coconut oil. - Plan: conjugated estrogens (PREMARIN) vaginal cream  Screening for breast cancer - Current on mammos with Dr. Bary Castilla   Meds ordered this  encounter  Medications  . conjugated estrogens (PREMARIN) vaginal cream    Sig: 1 applicatorful vaginally 1-2 times weekly    Dispense:  42.5 g    Refill:  2           GYN counsel breast self exam, mammography screening, menopause, adequate intake of calcium and vitamin D, diet and exercise    F/U  Return in about 1 year (around 04/22/2018).  Camber Ninh B. Kassia Demarinis, PA-C 04/22/2017 9:25 AM

## 2017-04-25 LAB — IGP, APTIMA HPV
HPV Aptima: NEGATIVE
PAP Smear Comment: 0

## 2017-05-05 ENCOUNTER — Other Ambulatory Visit: Payer: Self-pay | Admitting: Family Medicine

## 2017-05-05 DIAGNOSIS — J302 Other seasonal allergic rhinitis: Secondary | ICD-10-CM

## 2017-05-06 ENCOUNTER — Other Ambulatory Visit: Payer: Self-pay | Admitting: Family Medicine

## 2017-05-06 DIAGNOSIS — J302 Other seasonal allergic rhinitis: Secondary | ICD-10-CM

## 2017-05-06 NOTE — Telephone Encounter (Signed)
I believe this is actually Dr B patient

## 2017-05-06 NOTE — Telephone Encounter (Signed)
LOV 03/11/2017.

## 2017-06-02 ENCOUNTER — Ambulatory Visit
Admission: RE | Admit: 2017-06-02 | Discharge: 2017-06-02 | Disposition: A | Discharge status: Home / Self Care | Payer: Federal, State, Local not specified - PPO | Source: Ambulatory Visit | Attending: Family Medicine | Admitting: Family Medicine

## 2017-06-02 ENCOUNTER — Ambulatory Visit: Payer: Federal, State, Local not specified - PPO | Admitting: Family Medicine

## 2017-06-02 ENCOUNTER — Encounter: Payer: Self-pay | Admitting: Family Medicine

## 2017-06-02 ENCOUNTER — Other Ambulatory Visit: Payer: Self-pay

## 2017-06-02 VITALS — BP 136/90 | HR 106 | Temp 98.7°F | Resp 16 | Wt 155.0 lb

## 2017-06-02 DIAGNOSIS — R05 Cough: Secondary | ICD-10-CM | POA: Insufficient documentation

## 2017-06-02 DIAGNOSIS — J40 Bronchitis, not specified as acute or chronic: Secondary | ICD-10-CM

## 2017-06-02 DIAGNOSIS — R0789 Other chest pain: Secondary | ICD-10-CM | POA: Insufficient documentation

## 2017-06-02 MED ORDER — PREDNISONE 50 MG PO TABS: 50.0000 mg | ORAL_TABLET | Freq: Every day | ORAL | 0 refills | Status: DC

## 2017-06-02 NOTE — Patient Instructions (Signed)

## 2017-06-02 NOTE — Progress Notes (Signed)
Patient: Alyssa Thornton Female    DOB: 1956/02/15   62 y.o.   MRN: [id number] Visit Date: 06/02/2017  Today's Provider: Lavon Paganini, MD   I, Martha Clan, CMA, am acting as scribe for Lavon Paganini, MD.  Chief Complaint  Patient presents with  . URI   Subjective:    URI   This is a new (x 3 days after going on a cruise to [place], [place]) problem. The problem has been gradually worsening. There has been no fever (does have chills). Associated symptoms include chest pain (from coughing), congestion, coughing (dry), neck pain, rhinorrhea, sneezing, a sore throat and wheezing. Pertinent negatives include no abdominal pain, diarrhea, dysuria, ear pain, headaches, nausea, plugged ear sensation, sinus pain, swollen glands or vomiting. Treatments tried: Robitussin. The treatment provided mild relief.      Allergies  Allergen Reactions  . Diclofenac Sodium Diarrhea  . Tramadol Diarrhea  . Levaquin [Levofloxacin] Nausea And Vomiting     Current Outpatient Medications:  .  aspirin 81 MG tablet, Take 81 mg by mouth daily., Disp: , Rfl:  .  Cholecalciferol (VITAMIN D-3 PO), Take 4,000 Int'l Units by mouth 4 (four) times daily., Disp: , Rfl:  .  conjugated estrogens (PREMARIN) vaginal cream, 1 applicatorful vaginally 1-2 times weekly, Disp: 42.5 g, Rfl: 2 .  FINACEA 15 % cream, , Disp: , Rfl:  .  fluticasone (FLONASE) 50 MCG/ACT nasal spray, Place 1 spray into both nostrils daily as needed., Disp: 16 g, Rfl: 12 .  metroNIDAZOLE (METROGEL) 1 % gel, Apply 1 application topically daily., Disp: , Rfl:  .  montelukast (SINGULAIR) 10 MG tablet, take 1 tablet by mouth once daily, Disp: 90 tablet, Rfl: 3 .  Multiple Vitamin (MULTIVITAMIN) tablet, Take 1 tablet by mouth daily., Disp: , Rfl:  .  quinapril-hydrochlorothiazide (ACCURETIC) 10-12.5 MG tablet, Take 1 tablet by mouth daily., Disp: 90 tablet, Rfl: 3 .  tamoxifen (NOLVADEX) 20 MG tablet, take 1 tablet by mouth once  daily, Disp: 90 tablet, Rfl: 1 .  vitamin C (ASCORBIC ACID) 500 MG tablet, Take 500 mg by mouth daily., Disp: , Rfl:  .  vitamin E (VITAMIN E) 400 UNIT capsule, Take 400 Units by mouth daily., Disp: , Rfl:  .  RA VITAMIN B-6 100 MG tablet, Take 100 mg daily by mouth., Disp: , Rfl: 0  Review of Systems  HENT: Positive for congestion, rhinorrhea, sneezing and sore throat. Negative for ear pain and sinus pain.   Respiratory: Positive for cough (dry) and wheezing.   Cardiovascular: Positive for chest pain (from coughing).  Gastrointestinal: Negative for abdominal pain, diarrhea, nausea and vomiting.  Genitourinary: Negative for dysuria.  Musculoskeletal: Positive for neck pain.  Neurological: Negative for headaches.    Social History   Tobacco Use  . Smoking status: Never Smoker  . Smokeless tobacco: Never Used  Substance Use Topics  . Alcohol use: No   Objective:   BP 136/90 (BP Location: Right Arm, Patient Position: Sitting, Cuff Size: Normal)   Pulse (!) 106   Temp 98.7 F (37.1 C) (Oral)   Resp 16   Wt 155 lb (70.3 kg)   SpO2 98%   BMI 27.46 kg/m  Vitals:   06/02/17 1606  BP: 136/90  Pulse: (!) 106  Resp: 16  Temp: 98.7 F (37.1 C)  TempSrc: Oral  SpO2: 98%  Weight: 155 lb (70.3 kg)     Physical Exam  Constitutional: She is oriented  to person, place, and time. She appears well-developed and well-nourished. No distress.  HENT:  Head: Normocephalic and atraumatic.  Right Ear: Tympanic membrane, external ear and ear canal normal.  Left Ear: Tympanic membrane, external ear and ear canal normal.  Nose: Mucosal edema and rhinorrhea present. Right sinus exhibits no maxillary sinus tenderness and no frontal sinus tenderness. Left sinus exhibits no maxillary sinus tenderness and no frontal sinus tenderness.  Mouth/Throat: Uvula is midline, oropharynx is clear and moist and mucous membranes are normal. No oropharyngeal exudate.  Eyes: Conjunctivae and EOM are normal.  Pupils are equal, round, and reactive to light. Right eye exhibits no discharge. Left eye exhibits no discharge. No scleral icterus.  Neck: Neck supple.  Cardiovascular: Normal rate, regular rhythm, normal heart sounds and intact distal pulses.  No murmur heard. Pulmonary/Chest: Effort normal. No respiratory distress. She has decreased breath sounds. She has wheezes.  Musculoskeletal: She exhibits no edema.  Lymphadenopathy:    She has no cervical adenopathy.  Neurological: She is alert and oriented to person, place, and time.  Skin: Skin is warm and dry. No rash noted.  Psychiatric: She has a normal mood and affect. Her behavior is normal.        Assessment & Plan:     1. Bronchitis - patient with diffusely decreased breath sounds and end-expiratory wheeze - most likely viral bronchitis - no h/o asthma or COPD - will check CXR to ensure no pneumonia - treat with 5d burst of prednisone - discussed natural course and symptomatic management - return precautions discussed - DG Chest 2 View; Future    Meds ordered this encounter  Medications  . predniSONE (DELTASONE) 50 MG tablet    Sig: Take 1 tablet (50 mg total) by mouth daily with breakfast.    Dispense:  5 tablet    Refill:  0     Return if symptoms worsen or fail to improve.   The entirety of the information documented in the History of Present Illness, Review of Systems and Physical Exam were personally obtained by me. Portions of this information were initially documented by Raquel Sarna Ratchford, CMA and reviewed by me for thoroughness and accuracy.    Virginia Crews, MD, MPH [facility] 06/02/2017 4:34 PM

## 2017-06-03 ENCOUNTER — Telehealth: Payer: Self-pay

## 2017-06-03 NOTE — Telephone Encounter (Signed)
-----   Message from Virginia Crews, MD sent at 06/03/2017  8:12 AM EST ----- Normal chest Elenore Rota, MD, MPH [facility] 06/03/2017 8:12 AM

## 2017-06-03 NOTE — Telephone Encounter (Signed)
error 

## 2017-06-03 NOTE — Telephone Encounter (Signed)
Pt advised.

## 2017-06-09 ENCOUNTER — Telehealth: Payer: Self-pay | Admitting: General Surgery

## 2017-06-09 MED ORDER — TAMOXIFEN CITRATE 20 MG PO TABS: 20.0000 mg | ORAL_TABLET | Freq: Every day | ORAL | 1 refills | Status: DC

## 2017-06-09 NOTE — Addendum Note (Signed)
Addended by: Gaspar Cola L on: 06/09/2017 10:00 AM   Modules accepted: Orders

## 2017-06-09 NOTE — Telephone Encounter (Signed)
Send Tamoxifen to [facility] in [place].

## 2017-06-09 NOTE — Telephone Encounter (Signed)
Patient's husband called today for the patient & requested that we call or send in a prescription for Tamoxifen 20mg  to [facility] in [place]([phone]).They were using [facility] until they closed & switched to [facility].Please call patient to confirm script was sent @ [phone].

## 2017-07-28 ENCOUNTER — Other Ambulatory Visit: Payer: Self-pay | Admitting: Family Medicine

## 2017-07-28 DIAGNOSIS — I10 Essential (primary) hypertension: Secondary | ICD-10-CM

## 2017-07-28 MED ORDER — QUINAPRIL-HYDROCHLOROTHIAZIDE 10-12.5 MG PO TABS: 1.0000 | ORAL_TABLET | Freq: Every day | ORAL | 3 refills | Status: DC

## 2017-07-28 NOTE — Telephone Encounter (Signed)
Pt contacted office for refill request on the following medications:  quinapril-hydrochlorothiazide (ACCURETIC) 10-12.5 MG tablet  [facility]  90 day supply  Last Rx: 05/21/16 3 refills LOV: 06/02/17 Please advise. Thanks TNP

## 2017-08-18 DIAGNOSIS — H5711 Ocular pain, right eye: Secondary | ICD-10-CM | POA: Diagnosis not present

## 2017-09-08 ENCOUNTER — Other Ambulatory Visit: Payer: Self-pay

## 2017-09-08 DIAGNOSIS — J302 Other seasonal allergic rhinitis: Secondary | ICD-10-CM

## 2017-09-08 MED ORDER — FLUTICASONE PROPIONATE 50 MCG/ACT NA SUSP: 1.0000 | Freq: Every day | NASAL | 12 refills | Status: DC | PRN

## 2017-09-08 NOTE — Telephone Encounter (Signed)
Patient called requesting refills. Thanks!  

## 2017-09-22 ENCOUNTER — Telehealth: Payer: Self-pay

## 2017-09-22 ENCOUNTER — Other Ambulatory Visit: Payer: Self-pay

## 2017-09-22 DIAGNOSIS — N952 Postmenopausal atrophic vaginitis: Secondary | ICD-10-CM

## 2017-09-22 MED ORDER — ESTROGENS, CONJUGATED 0.625 MG/GM VA CREA: TOPICAL_CREAM | VAGINAL | 2 refills | Status: DC

## 2017-09-22 NOTE — Telephone Encounter (Signed)
RN to send Rx to new pharm. See med list from 1/19.

## 2017-09-22 NOTE — Telephone Encounter (Signed)
Pt needs rf of Premarin sent to [facility]. She has switched from [employer] to [employer] (on file). Cb#[phone]

## 2017-09-22 NOTE — Telephone Encounter (Signed)
Please advise 

## 2017-09-23 ENCOUNTER — Ambulatory Visit: Payer: Federal, State, Local not specified - PPO | Admitting: Podiatry

## 2017-09-29 ENCOUNTER — Encounter: Payer: Self-pay | Admitting: Family Medicine

## 2017-09-29 ENCOUNTER — Ambulatory Visit: Payer: Federal, State, Local not specified - PPO | Admitting: Family Medicine

## 2017-09-29 VITALS — BP 124/82 | HR 88 | Temp 98.3°F | Resp 16 | Wt 153.0 lb

## 2017-09-29 DIAGNOSIS — I10 Essential (primary) hypertension: Secondary | ICD-10-CM | POA: Diagnosis not present

## 2017-09-29 DIAGNOSIS — M19071 Primary osteoarthritis, right ankle and foot: Secondary | ICD-10-CM | POA: Diagnosis not present

## 2017-09-29 NOTE — Progress Notes (Signed)
Patient: Alyssa Thornton Female    DOB: 02/14/1956   62 y.o.   MRN: [id number] Visit Date: 09/29/2017  Today's Provider: Lavon Paganini, MD   Chief Complaint  Patient presents with  . Hypertension   Subjective:    Hypertension  This is a chronic problem. The problem is controlled. Pertinent negatives include no anxiety, blurred vision, chest pain, headaches, malaise/fatigue, neck pain, orthopnea, palpitations, peripheral edema, PND, shortness of breath or sweats. There are no associated agents to hypertension. There are no compliance problems.   Toe Pain   The incident occurred more than 1 week ago. The pain is present in the right toes. Pertinent negatives include no inability to bear weight, loss of motion, loss of sensation, muscle weakness, numbness or tingling. She reports no foreign bodies present.  Pain started over midfoot ~2 months ago.  She thought this was due to trauma after dog stepped on it and tree limb fell on it.  Loosening shoe helped with mid-foot pain, which has now resolved.  Sontinues to have pain in base of R great toe, however. Previously swollen, but no longer.  No erythema.  No h/o gout.  Nothing seems to make it better or worse. Has not tried medications.     Allergies  Allergen Reactions  . Diclofenac Sodium Diarrhea  . Tramadol Diarrhea  . Levaquin [Levofloxacin] Nausea And Vomiting     Current Outpatient Medications:  .  aspirin 81 MG tablet, Take 81 mg by mouth daily., Disp: , Rfl:  .  Cholecalciferol (VITAMIN D-3 PO), Take 4,000 Int'l Units by mouth 4 (four) times daily., Disp: , Rfl:  .  conjugated estrogens (PREMARIN) vaginal cream, 1 applicatorful vaginally 1-2 times weekly, Disp: 42.5 g, Rfl: 2 .  FINACEA 15 % cream, , Disp: , Rfl:  .  fluticasone (FLONASE) 50 MCG/ACT nasal spray, Place 1 spray into both nostrils daily as needed., Disp: 16 g, Rfl: 12 .  metroNIDAZOLE (METROGEL) 1 % gel, Apply 1 application topically daily., Disp: , Rfl:    .  montelukast (SINGULAIR) 10 MG tablet, take 1 tablet by mouth once daily, Disp: 90 tablet, Rfl: 3 .  Multiple Vitamin (MULTIVITAMIN) tablet, Take 1 tablet by mouth daily., Disp: , Rfl:  .  quinapril-hydrochlorothiazide (ACCURETIC) 10-12.5 MG tablet, Take 1 tablet by mouth daily., Disp: 90 tablet, Rfl: 3 .  RA VITAMIN B-6 100 MG tablet, Take 100 mg daily by mouth., Disp: , Rfl: 0 .  tamoxifen (NOLVADEX) 20 MG tablet, Take 1 tablet (20 mg total) by mouth daily., Disp: 90 tablet, Rfl: 1 .  vitamin C (ASCORBIC ACID) 500 MG tablet, Take 500 mg by mouth daily., Disp: , Rfl:  .  vitamin E (VITAMIN E) 400 UNIT capsule, Take 400 Units by mouth daily., Disp: , Rfl:  .  predniSONE (DELTASONE) 50 MG tablet, Take 1 tablet (50 mg total) by mouth daily with breakfast., Disp: 5 tablet, Rfl: 0  Review of Systems  Constitutional: Negative.  Negative for malaise/fatigue.  Eyes: Negative for blurred vision.  Respiratory: Negative.  Negative for shortness of breath.   Cardiovascular: Negative.  Negative for chest pain, palpitations, orthopnea and PND.  Gastrointestinal: Negative.   Musculoskeletal: Negative for neck pain. Arthralgias: Right big toe pain.  Neurological: Negative for dizziness, tingling, light-headedness, numbness and headaches.    Social History   Tobacco Use  . Smoking status: Never Smoker  . Smokeless tobacco: Never Used  Substance Use Topics  . Alcohol use: No  Objective:   BP 124/82 (BP Location: Right Arm, Patient Position: Sitting, Cuff Size: Normal)   Pulse 88   Temp 98.3 F (36.8 C) (Oral)   Resp 16   Wt 153 lb (69.4 kg)   BMI 27.10 kg/m  Vitals:   09/29/17 0805  BP: 124/82  Pulse: 88  Resp: 16  Temp: 98.3 F (36.8 C)  TempSrc: Oral  Weight: 153 lb (69.4 kg)     Physical Exam  Constitutional: She is oriented to person, place, and time. She appears well-developed and well-nourished. No distress.  HENT:  Head: Normocephalic and atraumatic.  Eyes: Conjunctivae  are normal.  Cardiovascular: Normal rate, regular rhythm, normal heart sounds and intact distal pulses.  No murmur heard. Pulmonary/Chest: Effort normal and breath sounds normal. No respiratory distress. She has no wheezes. She has no rales.  Musculoskeletal: She exhibits no edema.  TTP over R 1st MTP.  ROM intact. No swelling, deformity, erythema.  No TTP over other bony landmarks.  Neurological: She is alert and oriented to person, place, and time.  Skin: Skin is warm and dry. Capillary refill takes less than 2 seconds. No rash noted.  Psychiatric: She has a normal mood and affect. Her behavior is normal.  Vitals reviewed.    Assessment & Plan:   Problem List Items Addressed This Visit      Cardiovascular and Mediastinum   Essential hypertension - Primary    Well controlled Continue current meds Check BMP F/u in 6 months      Relevant Orders   Basic Metabolic Panel (BMET)    Other Visit Diagnoses    Arthritis of first metatarsophalangeal (MTP) joint of right foot        - new problem - suspect arthritis of this joint given tenderness and swelling - trial of 2 wks of Aleve BID - no imaging at this time, but could consider if continues to bother her - return precautions discussed   Return in about 6 months (around 03/31/2018) for Chronic disease f/u.   The entirety of the information documented in the History of Present Illness, Review of Systems and Physical Exam were personally obtained by me. Portions of this information were initially documented by Ashley Royalty, CMA and reviewed by me for thoroughness and accuracy.    Virginia Crews, MD, MPH [facility] 09/29/2017 8:30 AM

## 2017-09-29 NOTE — Patient Instructions (Signed)

## 2017-09-29 NOTE — Assessment & Plan Note (Signed)
Well controlled Continue current meds Check BMP F/u in 6 months

## 2017-09-30 ENCOUNTER — Telehealth: Payer: Self-pay

## 2017-09-30 LAB — BASIC METABOLIC PANEL
BUN/Creatinine Ratio: 18 (ref 12–28)
BUN: 11 mg/dL (ref 8–27)
CO2: 22 mmol/L (ref 20–29)
Calcium: 8.8 mg/dL (ref 8.7–10.3)
Chloride: 106 mmol/L (ref 96–106)
Creatinine, Ser: 0.6 mg/dL (ref 0.57–1.00)
GFR calc Af Amer: 111 mL/min/{1.73_m2} (ref >59)
GFR calc non Af Amer: 97 mL/min/{1.73_m2} (ref >59)
Glucose: 116 mg/dL — ABNORMAL HIGH (ref 65–99)
Potassium: 4 mmol/L (ref 3.5–5.2)
Sodium: 143 mmol/L (ref 134–144)

## 2017-09-30 NOTE — Telephone Encounter (Signed)
LMTCB  Thanks,  -Alyssa Thornton 

## 2017-09-30 NOTE — Telephone Encounter (Signed)
-----   Message from Virginia Crews, MD sent at 09/30/2017  8:14 AM EDT ----- Normal kidney function and electrolytes.  Blood sugar remains a little bit elevated.  Virginia Crews, MD, MPH [facility] 09/30/2017 8:14 AM

## 2017-09-30 NOTE — Telephone Encounter (Signed)
Patient advised as directed below.  Thanks,  -Ilhan Debenedetto 

## 2017-11-24 ENCOUNTER — Other Ambulatory Visit: Payer: Self-pay | Admitting: General Surgery

## 2018-02-23 ENCOUNTER — Other Ambulatory Visit: Payer: Self-pay | Admitting: General Surgery

## 2018-04-13 ENCOUNTER — Other Ambulatory Visit: Payer: Self-pay

## 2018-04-13 DIAGNOSIS — I10 Essential (primary) hypertension: Secondary | ICD-10-CM

## 2018-04-13 MED ORDER — QUINAPRIL-HYDROCHLOROTHIAZIDE 10-12.5 MG PO TABS: 1.0000 | ORAL_TABLET | Freq: Every day | ORAL | 3 refills | Status: DC

## 2018-04-14 ENCOUNTER — Other Ambulatory Visit: Payer: Self-pay | Admitting: Family Medicine

## 2018-04-14 DIAGNOSIS — J302 Other seasonal allergic rhinitis: Secondary | ICD-10-CM

## 2018-04-14 NOTE — Telephone Encounter (Signed)
Patient is asking for a refill of her "Fluticasone" and wants it sent to [facility] in [place]. Also asking for the generic brand

## 2018-04-15 MED ORDER — FLUTICASONE PROPIONATE 50 MCG/ACT NA SUSP: 1.0000 | Freq: Every day | NASAL | 12 refills | Status: DC | PRN

## 2018-04-27 ENCOUNTER — Ambulatory Visit: Payer: Federal, State, Local not specified - PPO | Admitting: Family Medicine

## 2018-05-05 ENCOUNTER — Encounter: Payer: Self-pay | Admitting: Family Medicine

## 2018-05-05 ENCOUNTER — Ambulatory Visit: Payer: Federal, State, Local not specified - PPO | Admitting: Family Medicine

## 2018-05-05 VITALS — BP 126/85 | HR 79 | Temp 97.9°F | Ht 63.0 in | Wt 155.0 lb

## 2018-05-05 DIAGNOSIS — E785 Hyperlipidemia, unspecified: Secondary | ICD-10-CM | POA: Diagnosis not present

## 2018-05-05 DIAGNOSIS — R739 Hyperglycemia, unspecified: Secondary | ICD-10-CM

## 2018-05-05 DIAGNOSIS — R7303 Prediabetes: Secondary | ICD-10-CM | POA: Insufficient documentation

## 2018-05-05 DIAGNOSIS — D696 Thrombocytopenia, unspecified: Secondary | ICD-10-CM | POA: Diagnosis not present

## 2018-05-05 DIAGNOSIS — I10 Essential (primary) hypertension: Secondary | ICD-10-CM | POA: Diagnosis not present

## 2018-05-05 DIAGNOSIS — E663 Overweight: Secondary | ICD-10-CM | POA: Insufficient documentation

## 2018-05-05 NOTE — Assessment & Plan Note (Signed)
Not currently on any medications Recheck lipid panel and CMP

## 2018-05-05 NOTE — Assessment & Plan Note (Signed)
Discussed diet and exercise 

## 2018-05-05 NOTE — Patient Instructions (Signed)

## 2018-05-05 NOTE — Assessment & Plan Note (Signed)
Recheck hemoglobin A1c 

## 2018-05-05 NOTE — Progress Notes (Signed)
Patient: Alyssa Thornton Female    DOB: December 12, 1955   63 y.o.   MRN: [id number] Visit Date: 05/05/2018  Today's Provider: Lavon Paganini, MD   Chief Complaint  Patient presents with  . Hypertension   Subjective:    I, Tiburcio Pea, CMA, am acting as a Education administrator for Lavon Paganini, MD.    HPI  Hypertension, follow-up:  BP Readings from Last 3 Encounters:  05/05/18 126/85  09/29/17 124/82  06/02/17 136/90    She was last seen for hypertension 6 months ago.  BP at that visit was 124/82. Management changes since that visit include no changes. She reports good compliance with treatment. She is not having side effects.  She is not exercising. She is adherent to low salt diet.   Outside blood pressures are not being checked at home. She is experiencing none.  Patient denies chest pain, chest pressure/discomfort, claudication, dyspnea, exertional chest pressure/discomfort, fatigue, irregular heart beat, lower extremity edema, near-syncope, orthopnea, palpitations, paroxysmal nocturnal dyspnea, syncope and tachypnea.   Cardiovascular risk factors include dyslipidemia and hypertension.  Use of agents associated with hypertension: none.     Weight trend: stable Wt Readings from Last 3 Encounters:  05/05/18 155 lb (70.3 kg)  09/29/17 153 lb (69.4 kg)  06/02/17 155 lb (70.3 kg)   ------------------------------------------------------------------------  Allergies  Allergen Reactions  . Diclofenac Sodium Diarrhea  . Tramadol Diarrhea  . Levaquin [Levofloxacin] Nausea And Vomiting     Current Outpatient Medications:  .  aspirin 81 MG tablet, Take 81 mg by mouth daily., Disp: , Rfl:  .  Cholecalciferol (VITAMIN D-3 PO), Take 4,000 Int'l Units by mouth 4 (four) times daily., Disp: , Rfl:  .  conjugated estrogens (PREMARIN) vaginal cream, 1 applicatorful vaginally 1-2 times weekly, Disp: 42.5 g, Rfl: 2 .  FINACEA 15 % cream, , Disp: , Rfl:  .  metroNIDAZOLE  (METROGEL) 1 % gel, Apply 1 application topically daily., Disp: , Rfl:  .  montelukast (SINGULAIR) 10 MG tablet, take 1 tablet by mouth once daily, Disp: 90 tablet, Rfl: 3 .  Multiple Vitamin (MULTIVITAMIN) tablet, Take 1 tablet by mouth daily., Disp: , Rfl:  .  quinapril-hydrochlorothiazide (ACCURETIC) 10-12.5 MG tablet, Take 1 tablet by mouth daily., Disp: 90 tablet, Rfl: 3 .  tamoxifen (NOLVADEX) 20 MG tablet, TAKE 1 TABLET(20 MG) BY MOUTH DAILY, Disp: 90 tablet, Rfl: 0 .  vitamin C (ASCORBIC ACID) 500 MG tablet, Take 500 mg by mouth daily., Disp: , Rfl:  .  vitamin E (VITAMIN E) 400 UNIT capsule, Take 400 Units by mouth daily., Disp: , Rfl:  .  fluticasone (FLONASE) 50 MCG/ACT nasal spray, Place 1 spray into both nostrils daily as needed. (Patient not taking: Reported on 05/05/2018), Disp: 16 g, Rfl: 12  Review of Systems  Constitutional: Negative.   Respiratory: Negative.   Cardiovascular: Negative.   Musculoskeletal: Negative.     Social History   Tobacco Use  . Smoking status: Never Smoker  . Smokeless tobacco: Never Used  Substance Use Topics  . Alcohol use: No      Objective:   BP 126/85 (BP Location: Right Arm, Patient Position: Sitting, Cuff Size: Normal)   Pulse 79   Temp 97.9 F (36.6 C) (Oral)   Ht 5\' 3"  (1.6 m)   Wt 155 lb (70.3 kg)   SpO2 98%   BMI 27.46 kg/m  Vitals:   05/05/18 0809  BP: 126/85  Pulse: 79  Temp: 97.9 F (  36.6 C)  TempSrc: Oral  SpO2: 98%  Weight: 155 lb (70.3 kg)  Height: 5\' 3"  (1.6 m)     Physical Exam Vitals signs reviewed.  Constitutional:      General: She is not in acute distress.    Appearance: Normal appearance. She is well-developed. She is not diaphoretic.  HENT:     Head: Normocephalic and atraumatic.     Mouth/Throat:     Pharynx: Oropharynx is clear. No oropharyngeal exudate.  Eyes:     General: No scleral icterus.    Conjunctiva/sclera: Conjunctivae normal.  Neck:     Musculoskeletal: Neck supple.      Thyroid: No thyromegaly.  Cardiovascular:     Rate and Rhythm: Normal rate and regular rhythm.     Pulses: Normal pulses.     Heart sounds: Normal heart sounds. No murmur.  Pulmonary:     Effort: Pulmonary effort is normal. No respiratory distress.     Breath sounds: Normal breath sounds. No wheezing, rhonchi or rales.  Musculoskeletal:        General: No deformity.     Right lower leg: No edema.     Left lower leg: No edema.  Lymphadenopathy:     Cervical: No cervical adenopathy.  Skin:    General: Skin is warm and dry.     Capillary Refill: Capillary refill takes less than 2 seconds.     Findings: No rash.  Neurological:     Mental Status: She is alert and oriented to person, place, and time.  Psychiatric:        Mood and Affect: Mood normal.        Behavior: Behavior normal.        Thought Content: Thought content normal.         Assessment & Plan   Problem List Items Addressed This Visit      Cardiovascular and Mediastinum   Essential hypertension - Primary    Well-controlled Continue current medications Check CMP Follow-up in 6 months      Relevant Orders   Comprehensive metabolic panel     Other   Hyperlipidemia    Not currently on any medications Recheck lipid panel and CMP      Relevant Orders   Comprehensive metabolic panel   Lipid panel   Hyperglycemia    Recheck hemoglobin A1c      Relevant Orders   Hemoglobin A1c   Overweight    Discussed diet and exercise        Other Visit Diagnoses    Thrombocytopenia ([place])       Relevant Orders   CBC w/Diff/Platelet       Return in about 6 months (around 11/03/2018) for chronic disease f/u.   The entirety of the information documented in the History of Present Illness, Review of Systems and Physical Exam were personally obtained by me. Portions of this information were initially documented by Tiburcio Pea, CMA and reviewed by me for thoroughness and accuracy.    Virginia Crews, MD,  MPH [facility] 05/05/2018 9:01 AM

## 2018-05-05 NOTE — Assessment & Plan Note (Signed)
Well-controlled Continue current medications Check CMP Follow-up in 6 months

## 2018-05-06 LAB — COMPREHENSIVE METABOLIC PANEL
ALT: 21 IU/L (ref 0–32)
AST: 24 IU/L (ref 0–40)
Albumin/Globulin Ratio: 2.1 (ref 1.2–2.2)
Albumin: 3.9 g/dL (ref 3.8–4.8)
Alkaline Phosphatase: 145 IU/L — ABNORMAL HIGH (ref 39–117)
BUN/Creatinine Ratio: 24 (ref 12–28)
BUN: 14 mg/dL (ref 8–27)
Bilirubin Total: 0.7 mg/dL (ref 0.0–1.2)
CO2: 24 mmol/L (ref 20–29)
Calcium: 8.8 mg/dL (ref 8.7–10.3)
Chloride: 103 mmol/L (ref 96–106)
Creatinine, Ser: 0.59 mg/dL (ref 0.57–1.00)
GFR calc Af Amer: 112 mL/min/{1.73_m2} (ref >59)
GFR calc non Af Amer: 97 mL/min/{1.73_m2} (ref >59)
Globulin, Total: 1.9 g/dL (ref 1.5–4.5)
Glucose: 103 mg/dL — ABNORMAL HIGH (ref 65–99)
Potassium: 4 mmol/L (ref 3.5–5.2)
Sodium: 141 mmol/L (ref 134–144)
Total Protein: 5.8 g/dL — ABNORMAL LOW (ref 6.0–8.5)

## 2018-05-06 LAB — CBC WITH DIFFERENTIAL/PLATELET
Basophils Absolute: 0 10*3/uL (ref 0.0–0.2)
Basos: 1 %
EOS (ABSOLUTE): 0.1 10*3/uL (ref 0.0–0.4)
Eos: 3 %
Hematocrit: 41.8 % (ref 34.0–46.6)
Hemoglobin: 13.9 g/dL (ref 11.1–15.9)
Immature Grans (Abs): 0 10*3/uL (ref 0.0–0.1)
Immature Granulocytes: 0 %
Lymphocytes Absolute: 1.5 10*3/uL (ref 0.7–3.1)
Lymphs: 34 %
MCH: 30.3 pg (ref 26.6–33.0)
MCHC: 33.3 g/dL (ref 31.5–35.7)
MCV: 91 fL (ref 79–97)
Monocytes Absolute: 0.4 10*3/uL (ref 0.1–0.9)
Monocytes: 8 %
Neutrophils Absolute: 2.4 10*3/uL (ref 1.4–7.0)
Neutrophils: 54 %
Platelets: 124 10*3/uL — ABNORMAL LOW (ref 150–450)
RBC: 4.58 x10E6/uL (ref 3.77–5.28)
RDW: 12.1 % (ref 11.7–15.4)
WBC: 4.4 10*3/uL (ref 3.4–10.8)

## 2018-05-06 LAB — HEMOGLOBIN A1C
Est. average glucose Bld gHb Est-mCnc: 114 mg/dL
Hgb A1c MFr Bld: 5.6 % (ref 4.8–5.6)

## 2018-05-06 LAB — LIPID PANEL
Chol/HDL Ratio: 2.4 ratio (ref 0.0–4.4)
Cholesterol, Total: 151 mg/dL (ref 100–199)
HDL: 63 mg/dL (ref >39)
LDL Calculated: 75 mg/dL (ref 0–99)
Triglycerides: 66 mg/dL (ref 0–149)
VLDL Cholesterol Cal: 13 mg/dL (ref 5–40)

## 2018-05-08 ENCOUNTER — Telehealth: Payer: Self-pay

## 2018-05-08 NOTE — Telephone Encounter (Signed)
Patient was advised.  

## 2018-05-08 NOTE — Telephone Encounter (Signed)
-----   Message from Virginia Crews, MD sent at 05/06/2018  5:04 PM EST ----- Normal labs, except decreasing platelet count.  Hold aspirin and recheck in 1 month

## 2018-05-31 ENCOUNTER — Other Ambulatory Visit: Payer: Self-pay | Admitting: General Surgery

## 2018-06-01 ENCOUNTER — Telehealth: Payer: Self-pay | Admitting: *Deleted

## 2018-06-01 NOTE — Telephone Encounter (Signed)
Patient called the office wanting to see if a refill had been sent in to [facility] for her tamoxifen.   I did call her pharmacy while patient was on hold.   They did confirm they received request.   Patient aware the prescription should be ready for pick up in 90 minutes.   She verbalizes understanding.

## 2018-06-16 ENCOUNTER — Encounter: Payer: Self-pay | Admitting: Obstetrics and Gynecology

## 2018-06-16 ENCOUNTER — Ambulatory Visit (INDEPENDENT_AMBULATORY_CARE_PROVIDER_SITE_OTHER): Payer: Federal, State, Local not specified - PPO | Admitting: Obstetrics and Gynecology

## 2018-06-16 VITALS — BP 124/62 | HR 92 | Ht 63.0 in | Wt 156.0 lb

## 2018-06-16 DIAGNOSIS — Z01419 Encounter for gynecological examination (general) (routine) without abnormal findings: Secondary | ICD-10-CM | POA: Diagnosis not present

## 2018-06-16 DIAGNOSIS — Z8041 Family history of malignant neoplasm of ovary: Secondary | ICD-10-CM

## 2018-06-16 DIAGNOSIS — M85859 Other specified disorders of bone density and structure, unspecified thigh: Secondary | ICD-10-CM

## 2018-06-16 DIAGNOSIS — Z1239 Encounter for other screening for malignant neoplasm of breast: Secondary | ICD-10-CM

## 2018-06-16 DIAGNOSIS — N952 Postmenopausal atrophic vaginitis: Secondary | ICD-10-CM

## 2018-06-16 MED ORDER — ESTROGENS, CONJUGATED 0.625 MG/GM VA CREA: TOPICAL_CREAM | VAGINAL | 2 refills | Status: DC

## 2018-06-16 NOTE — Progress Notes (Signed)
PCP: Alyssa Crews, MD   Chief Complaint  Patient presents with  . Gynecologic Exam    HPI:      [name] is a 63 y.o. G0P0000 who LMP was No LMP recorded. Patient is postmenopausal., presents today for her annual examination.  Her menses are absent due to menopause. She does not have PMB. Alyssa Thornton has vasomotor sx.  Sex activity: single partner, not as sex active anymore; contraception - post menopausal status. She does have vaginal dryness. Uses premarin vag crm, occas lubricant without full sx relief.  Last Pap: 04/22/17 Results were: no abnormalities /neg HPV DNA.  Hx of STDs: none  Last mammogram: March 11, 2017  Results were: normal--routine follow-up in 12 months. Followed by Dr. Bary Castilla but mammo order not in system. On tamoxifen for 5 yrs due to LT breast atypical hyperplasia 2014--comes off this yr.  There is no FH of breast cancer. There is a FH of ovarian cancer in her MGM, but pt doesn't meet genetic testing guidelines with her [employer]. The patient does do self-breast exams.  Colonoscopy: ~2 yrs ago, repeat due after 10 yrs, with Dr. Bary Castilla.  DEXA: 2018--stable osteopenia in spine/hip.  Tobacco use: The patient denies current or previous tobacco use. Alcohol use: none Exercise: very active  She does get adequate calcium and Vitamin D in her diet.  Labs with PCP.   Past Medical History:  Diagnosis Date  . Atypical hyperplasia of left breast 07/02/2013   1 mm area of atypia associated with a calcified papilloma.  . Fatty liver   . Hypertension   . Osteopenia   . Postmenopausal atrophic vaginitis     Past Surgical History:  Procedure Laterality Date  . bartholins gland surgery    . BREAST BIOPSY Left 11/15/2005   LEFT BREAST, CORE BIOPSY:: STROMAL SCLEROSIS WITHIN BENIGN ATROPHIC BREAST TISSUE  . BREAST BIOPSY Left July 02, 2013   left breast papilloma with focal atypia  . BREAST EXCISIONAL BIOPSY Left 2015  . COLONOSCOPY   2007-2008, 2015   Dr Byrnett/Dr Allen Norris  . TONSILLECTOMY AND ADENOIDECTOMY  1960    Family History  Problem Relation Age of Onset  . Heart disease Father   . Hypertension Father   . Cancer Sister        bone marrow cancer  . Diabetes Mother        type 2  . Cirrhosis Mother   . Hypercholesterolemia Brother   . Hyperlipidemia Brother   . Cancer - Ovarian Maternal Grandmother        90  . Stomach cancer Other 43  . Breast cancer Neg Hx     Social History   Socioeconomic History  . Marital status: Married    Spouse name: Not on file  . Number of children: Not on file  . Years of education: Not on file  . Highest education level: Not on file  Occupational History  . Not on file  Social Needs  . Financial resource strain: Not on file  . Food insecurity:    Worry: Not on file    Inability: Not on file  . Transportation needs:    Medical: Not on file    Non-medical: Not on file  Tobacco Use  . Smoking status: Never Smoker  . Smokeless tobacco: Never Used  Substance and Sexual Activity  . Alcohol use: No  . Drug use: No  . Sexual activity: Yes    Birth control/protection: Post-menopausal  Lifestyle  . Physical activity:    Days per week: Not on file    Minutes per session: Not on file  . Stress: Not on file  Relationships  . Social connections:    Talks on phone: Not on file    Gets together: Not on file    Attends religious service: Not on file    Active member of club or organization: Not on file    Attends meetings of clubs or organizations: Not on file    Relationship status: Not on file  . Intimate partner violence:    Fear of current or ex partner: Not on file    Emotionally abused: Not on file    Physically abused: Not on file    Forced sexual activity: Not on file  Other Topics Concern  . Not on file  Social History Narrative  . Not on file    Current Meds  Medication Sig  . aspirin 81 MG tablet Take 81 mg by mouth daily.  . Cholecalciferol  (VITAMIN D-3 PO) Take 4,000 Int'l Units by mouth 4 (four) times daily.  Marland Kitchen conjugated estrogens (PREMARIN) vaginal cream 1 applicatorful vaginally 1-2 times weekly  . FINACEA 15 % cream   . fluticasone (FLONASE) 50 MCG/ACT nasal spray Place 1 spray into both nostrils daily as needed.  . metroNIDAZOLE (METROGEL) 1 % gel Apply 1 application topically daily.  . montelukast (SINGULAIR) 10 MG tablet take 1 tablet by mouth once daily  . Multiple Vitamin (MULTIVITAMIN) tablet Take 1 tablet by mouth daily.  . quinapril-hydrochlorothiazide (ACCURETIC) 10-12.5 MG tablet Take 1 tablet by mouth daily.  . tamoxifen (NOLVADEX) 20 MG tablet TAKE 1 TABLET(20 MG) BY MOUTH DAILY  . vitamin C (ASCORBIC ACID) 500 MG tablet Take 500 mg by mouth daily.  . vitamin E (VITAMIN E) 400 UNIT capsule Take 400 Units by mouth daily.  . [DISCONTINUED] conjugated estrogens (PREMARIN) vaginal cream 1 applicatorful vaginally 1-2 times weekly      ROS:  Review of Systems  Constitutional: Negative for fatigue, fever and unexpected weight change.  Respiratory: Negative for cough, shortness of breath and wheezing.   Cardiovascular: Negative for chest pain, palpitations and leg swelling.  Gastrointestinal: Negative for blood in stool, constipation, diarrhea, nausea and vomiting.  Endocrine: Negative for cold intolerance, heat intolerance and polyuria.  Genitourinary: Positive for dyspareunia. Negative for dysuria, flank pain, frequency, genital sores, hematuria, menstrual problem, pelvic pain, urgency, vaginal bleeding, vaginal discharge and vaginal pain.  Musculoskeletal: Negative for back pain, joint swelling and myalgias.  Skin: Negative for rash.  Neurological: Negative for dizziness, syncope, light-headedness, numbness and headaches.  Hematological: Negative for adenopathy.  Psychiatric/Behavioral: Negative for agitation, confusion, sleep disturbance and suicidal ideas. The patient is not nervous/anxious.       Objective: BP 124/62   Pulse 92   Ht 5\' 3"  (1.6 m)   Wt 156 lb (70.8 kg)   BMI 27.63 kg/m    Physical Exam Constitutional:      Appearance: She is well-developed.  Genitourinary:     Vulva, vagina, uterus, right adnexa and left adnexa normal.     No vulval lesion or tenderness noted.     Vaginal atrophic mucosa present.     No vaginal discharge, erythema or tenderness.     No cervical motion tenderness or polyp.     Uterus is not enlarged or tender.     No right or left adnexal mass present.     Right adnexa not tender.  Left adnexa not tender.  Neck:     Musculoskeletal: Normal range of motion.     Thyroid: No thyromegaly.  Cardiovascular:     Rate and Rhythm: Normal rate and regular rhythm.     Heart sounds: Normal heart sounds. No murmur.  Pulmonary:     Effort: Pulmonary effort is normal.     Breath sounds: Normal breath sounds.  Chest:     Breasts:        Right: No mass, nipple discharge, skin change or tenderness.        Left: No mass, nipple discharge, skin change or tenderness.  Abdominal:     Palpations: Abdomen is soft.     Tenderness: There is no abdominal tenderness. There is no guarding.  Musculoskeletal: Normal range of motion.  Neurological:     General: No focal deficit present.     Mental Status: She is alert and oriented to person, place, and time.     Cranial Nerves: No cranial nerve deficit.  Skin:    General: Skin is warm and dry.  Psychiatric:        Mood and Affect: Mood normal.        Behavior: Behavior normal.        Thought Content: Thought content normal.        Judgment: Judgment normal.  Vitals signs reviewed.     Assessment/Plan:  Encounter for annual routine gynecological examination  Screening for breast cancer - Pt to sched mammo. Followed by Dr. Bary Castilla with tamoxifen. - Plan: MM 3D SCREEN BREAST BILATERAL  Osteopenia of hip, unspecified laterality - DEXA due at [facility]. COnt ca/Vit D/exercise. - Plan: DG Bone  Density  Postmenopausal atrophic vaginitis - Rx RF vag ERT. Add coconut oil. F/u prn.  - Plan: conjugated estrogens (PREMARIN) vaginal cream  Family history of ovarian cancer - Doesn't meet [employer] genetic testing guidelines.    Meds ordered this encounter  Medications  . conjugated estrogens (PREMARIN) vaginal cream    Sig: 1 applicatorful vaginally 1-2 times weekly    Dispense:  42.5 g    Refill:  2    Order Specific Question:   Supervising Provider    Answer:   Gae Dry [[id number]]           GYN counsel breast self exam, mammography screening, menopause, adequate intake of calcium and vitamin D, diet and exercise    F/U  Return in about 1 year (around 06/16/2019).  Saoirse Legere B. Kerrington Sova, PA-C 06/16/2018 4:29 PM

## 2018-06-16 NOTE — Patient Instructions (Signed)
I value your feedback and entrusting us with your care. If you get a [facility] patient survey, I would appreciate you taking the time to let us know about your experience today. Thank you! 

## 2018-07-26 ENCOUNTER — Other Ambulatory Visit: Payer: Self-pay | Admitting: Family Medicine

## 2018-07-26 DIAGNOSIS — J302 Other seasonal allergic rhinitis: Secondary | ICD-10-CM

## 2018-08-05 ENCOUNTER — Other Ambulatory Visit: Payer: Federal, State, Local not specified - PPO

## 2018-08-20 ENCOUNTER — Telehealth: Payer: Self-pay | Admitting: Family Medicine

## 2018-08-20 DIAGNOSIS — D696 Thrombocytopenia, unspecified: Secondary | ICD-10-CM

## 2018-08-20 NOTE — Telephone Encounter (Signed)
Pt Called saying she was in Jan and seen Dr. B and was told to get FU labs.  She does not know which labs and if she needs an appt also.  Please advise [phone]  thanks teri

## 2018-08-20 NOTE — Telephone Encounter (Signed)
Just a cbc. No appt necessary at this time.  Was supposed to be rechecked in February, but better late than never.

## 2018-08-20 NOTE — Telephone Encounter (Signed)
Patient advised. Lab slip printed at the front desk for pick up.

## 2018-08-21 ENCOUNTER — Other Ambulatory Visit: Payer: Self-pay

## 2018-08-21 DIAGNOSIS — D696 Thrombocytopenia, unspecified: Secondary | ICD-10-CM

## 2018-08-22 LAB — CBC WITH DIFFERENTIAL/PLATELET
Basophils Absolute: 0 10*3/uL (ref 0.0–0.2)
Basos: 1 %
EOS (ABSOLUTE): 0.1 10*3/uL (ref 0.0–0.4)
Eos: 2 %
Hematocrit: 40.4 % (ref 34.0–46.6)
Hemoglobin: 14.1 g/dL (ref 11.1–15.9)
Immature Grans (Abs): 0 10*3/uL (ref 0.0–0.1)
Immature Granulocytes: 0 %
Lymphocytes Absolute: 1.8 10*3/uL (ref 0.7–3.1)
Lymphs: 39 %
MCH: 31.1 pg (ref 26.6–33.0)
MCHC: 34.9 g/dL (ref 31.5–35.7)
MCV: 89 fL (ref 79–97)
Monocytes Absolute: 0.4 10*3/uL (ref 0.1–0.9)
Monocytes: 9 %
Neutrophils Absolute: 2.4 10*3/uL (ref 1.4–7.0)
Neutrophils: 49 %
Platelets: 131 10*3/uL — ABNORMAL LOW (ref 150–450)
RBC: 4.53 x10E6/uL (ref 3.77–5.28)
RDW: 12.1 % (ref 11.7–15.4)
WBC: 4.7 10*3/uL (ref 3.4–10.8)

## 2018-08-23 ENCOUNTER — Other Ambulatory Visit: Payer: Self-pay | Admitting: General Surgery

## 2018-08-24 ENCOUNTER — Telehealth: Payer: Self-pay

## 2018-08-24 NOTE — Telephone Encounter (Signed)
Platelets are not involved in leukemia and her blood work shows no signs of this.  We can set an evisit to discuss further evaluation and management if she would like

## 2018-08-24 NOTE — Telephone Encounter (Signed)
-----   Message from Virginia Crews, MD sent at 08/24/2018  8:21 AM EDT ----- Blood counts, including platelets are stable

## 2018-08-24 NOTE — Telephone Encounter (Signed)
Spoke to patient regarding information about labs. She declined a visit at this time to discuss anything further.

## 2018-08-24 NOTE — Telephone Encounter (Signed)
Patient is having some concerns with labs because she had a sister that passed away from leukemia. She would like to speak with you regarding further testing.

## 2018-08-24 NOTE — Telephone Encounter (Signed)
Left message for patient to call back  

## 2018-08-26 ENCOUNTER — Telehealth: Payer: Self-pay

## 2018-08-26 NOTE — Telephone Encounter (Signed)
She can call [facility] herself to schedule (not on Epic so order doesn't go through).

## 2018-08-26 NOTE — Telephone Encounter (Signed)
Pt aware. She asked if I knew what their protocol was, I said no.

## 2018-08-26 NOTE — Telephone Encounter (Signed)
Pt called [facility] to schedule her mammogram and was told she needed to isolate herself for 4 days before coming in to appt, can you send order to [facility] instead?

## 2018-09-02 ENCOUNTER — Telehealth: Payer: Self-pay | Admitting: General Surgery

## 2018-09-02 NOTE — Telephone Encounter (Signed)
Patient is calling asking if she needed to follow up with you, patient was not under recalls. Please advise.

## 2018-09-02 NOTE — Telephone Encounter (Signed)
Please arrange for a f/u visit. Thanks.

## 2018-09-07 ENCOUNTER — Telehealth: Payer: Self-pay

## 2018-09-07 ENCOUNTER — Telehealth: Payer: Self-pay | Admitting: Obstetrics and Gynecology

## 2018-09-07 NOTE — Telephone Encounter (Signed)
Please advise 

## 2018-09-07 NOTE — Telephone Encounter (Signed)
Spoke with [facility]. Pt needed addl views 11/18 at [facility] so went to [place]. Mammo 12/18 was [facility] 2, but [facility] doesn't have copy of those results. Per them, pt can sched scr mammo and they will request addl views from [facility]. [facility] wanted pt quarantined for 5 days and she can't do that with work. Pt aware to sched.

## 2018-09-07 NOTE — Telephone Encounter (Signed)
Patient would like bone density at [facility]

## 2018-09-07 NOTE — Telephone Encounter (Signed)
Pt calling; needs ABC to call [facility] - they need an order for a dx mammogram b/c pt didn't have f/u done last year.  Pt states she didn't know she needed a f/u last year.  [phone]

## 2018-09-16 DIAGNOSIS — M85859 Other specified disorders of bone density and structure, unspecified thigh: Secondary | ICD-10-CM | POA: Diagnosis not present

## 2018-09-16 DIAGNOSIS — Z780 Asymptomatic menopausal state: Secondary | ICD-10-CM | POA: Diagnosis not present

## 2018-09-16 DIAGNOSIS — Z1231 Encounter for screening mammogram for malignant neoplasm of breast: Secondary | ICD-10-CM | POA: Diagnosis not present

## 2018-09-16 DIAGNOSIS — M810 Age-related osteoporosis without current pathological fracture: Secondary | ICD-10-CM | POA: Diagnosis not present

## 2018-09-17 ENCOUNTER — Encounter: Payer: Self-pay | Admitting: Obstetrics and Gynecology

## 2018-09-22 ENCOUNTER — Telehealth: Payer: Self-pay | Admitting: Obstetrics and Gynecology

## 2018-09-22 NOTE — Telephone Encounter (Signed)
Pt aware of DEXA results. Now has osteoporosis of spine and slightly worse osteopenia of hip. Overall, fairly stable compared to 2018. Done at [facility]. Cont exercise/calcium/Vit D. Rechk in 2 yrs.

## 2018-10-02 ENCOUNTER — Other Ambulatory Visit: Payer: Federal, State, Local not specified - PPO

## 2018-10-14 ENCOUNTER — Telehealth: Payer: Self-pay | Admitting: Obstetrics and Gynecology

## 2018-10-14 NOTE — Telephone Encounter (Signed)
DOS 09/16/18. I contacted [facility] for CPT correction. Spoke with Carloyn Manner about needing the correction change for "osteopenia" to correct Osteoporosis (R91.638) PER Alicia Copland. He took all my information and routed to the the Coding dept to investigate.

## 2018-10-20 ENCOUNTER — Ambulatory Visit: Payer: Federal, State, Local not specified - PPO | Admitting: General Surgery

## 2018-10-20 ENCOUNTER — Other Ambulatory Visit: Payer: Self-pay

## 2018-10-20 ENCOUNTER — Encounter: Payer: Self-pay | Admitting: General Surgery

## 2018-10-20 VITALS — BP 140/82 | HR 74 | Temp 97.7°F | Ht 64.0 in | Wt 158.0 lb

## 2018-10-20 DIAGNOSIS — R928 Other abnormal and inconclusive findings on diagnostic imaging of breast: Secondary | ICD-10-CM | POA: Diagnosis not present

## 2018-10-20 NOTE — Patient Instructions (Signed)
Return as needed,

## 2018-10-20 NOTE — Progress Notes (Signed)
Patient ID: Alyssa Thornton, female   DOB: 26-Aug-1955, 63 y.o.   MRN: [id number]  Chief Complaint  Patient presents with  . Follow-up    HPI Alyssa Thornton is a 63 y.o. female who presents for a breast evaluation. The most recent mammogram was done on 10/02/2018 .  Patient does perform regular self breast checks and gets regular mammograms done.    HPI  Past Medical History:  Diagnosis Date  . Atypical hyperplasia of left breast 07/02/2013   1 mm area of atypia associated with a calcified papilloma.  . Fatty liver   . Hypertension   . Osteopenia   . Postmenopausal atrophic vaginitis     Past Surgical History:  Procedure Laterality Date  . bartholins gland surgery    . BREAST BIOPSY Left 11/15/2005   LEFT BREAST, CORE BIOPSY:: STROMAL SCLEROSIS WITHIN BENIGN ATROPHIC BREAST TISSUE  . BREAST BIOPSY Left July 02, 2013   left breast papilloma with focal atypia  . BREAST EXCISIONAL BIOPSY Left 2015  . COLONOSCOPY  2007-2008, 2015   Dr Evaleen Sant/Dr Allen Norris  . TONSILLECTOMY AND ADENOIDECTOMY  1960    Family History  Problem Relation Age of Onset  . Heart disease Father   . Hypertension Father   . Cancer Sister        bone marrow cancer  . Diabetes Mother        type 2  . Cirrhosis Mother   . Hypercholesterolemia Brother   . Hyperlipidemia Brother   . Cancer - Ovarian Maternal Grandmother        90  . Stomach cancer Other 67  . Breast cancer Neg Hx     Social History Social History   Tobacco Use  . Smoking status: Never Smoker  . Smokeless tobacco: Never Used  Substance Use Topics  . Alcohol use: No  . Drug use: No    Allergies  Allergen Reactions  . Diclofenac Sodium Diarrhea  . Tramadol Diarrhea  . Levaquin [Levofloxacin] Nausea And Vomiting    Current Outpatient Medications  Medication Sig Dispense Refill  . aspirin 81 MG tablet Take 81 mg by mouth daily.    . Cholecalciferol (VITAMIN D-3 PO) Take 4,000 Int'l Units by mouth 4 (four) times daily.    Marland Kitchen  conjugated estrogens (PREMARIN) vaginal cream 1 applicatorful vaginally 1-2 times weekly 42.5 g 2  . FINACEA 15 % cream     . fluticasone (FLONASE) 50 MCG/ACT nasal spray Place 1 spray into both nostrils daily as needed. 16 g 12  . metroNIDAZOLE (METROGEL) 1 % gel Apply 1 application topically daily.    . montelukast (SINGULAIR) 10 MG tablet TAKE 1 TABLET BY MOUTH ONCE DAILY 90 tablet 3  . Multiple Vitamin (MULTIVITAMIN) tablet Take 1 tablet by mouth daily.    . quinapril-hydrochlorothiazide (ACCURETIC) 10-12.5 MG tablet Take 1 tablet by mouth daily. 90 tablet 3  . tamoxifen (NOLVADEX) 20 MG tablet TAKE 1 TABLET BY MOUTH DAILY 90 tablet 0  . vitamin C (ASCORBIC ACID) 500 MG tablet Take 500 mg by mouth daily.    . vitamin E (VITAMIN E) 400 UNIT capsule Take 400 Units by mouth daily.     No current facility-administered medications for this visit.     Review of Systems Review of Systems  Constitutional: Negative.   Respiratory: Negative.   Cardiovascular: Negative.     Blood pressure 140/82, pulse 74, temperature 97.7 F (36.5 C), temperature source Skin, height 5\' 4"  (1.626 m), weight 158 lb (  71.7 kg), SpO2 98 %.  Physical Exam Physical Exam Constitutional:      Appearance: She is well-developed.  Eyes:     General: No scleral icterus.    Conjunctiva/sclera: Conjunctivae normal.  Neck:     Musculoskeletal: Neck supple.  Cardiovascular:     Rate and Rhythm: Normal rate and regular rhythm.     Heart sounds: Normal heart sounds.  Pulmonary:     Effort: Pulmonary effort is normal.     Breath sounds: Normal breath sounds.  Chest:     Breasts:        Right: Normal.        Left: Normal.    Lymphadenopathy:     Cervical: No cervical adenopathy.     Upper Body:     Right upper body: No supraclavicular or axillary adenopathy.     Left upper body: No supraclavicular or axillary adenopathy.  Skin:    General: Skin is warm and dry.  Neurological:     Mental Status: She is alert  and oriented to person, place, and time.     Data Reviewed Mammograms of November 2018 raised the question of the left breast.  Diagnostic studies completed at [facility] on 2017/03/29 resolve this issue.  Screening mammograms completed at [facility] September 16, 2018: BI-RADS-1.  Thank you  Assessment Stable mammogram.  Upcoming completion of 5 years of chemoprevention.  Plan  Return as needed.The patient is aware to call back for any questions or concerns.  The patient will continue annual screening mammograms with her Ardeth Perfect, PA at [facility]. .  2015 colonoscopy completed by GI reported a follow-up exam appropriate in 10 years.   HPI, Physical Exam, Assessment and Plan have been scribed under the direction and in the presence of Hervey Ard, MD.  Gaspar Cola, CMA   I have completed the exam and reviewed the above documentation for accuracy and completeness.  I agree with the above.  Haematologist has been used and any errors in dictation or transcription are unintentional.  Hervey Ard, M.D., F.A.C.S.   Alyssa Thornton 10/20/2018, 9:42 AM

## 2018-11-13 ENCOUNTER — Ambulatory Visit: Payer: Federal, State, Local not specified - PPO | Admitting: Family Medicine

## 2018-11-23 ENCOUNTER — Other Ambulatory Visit: Payer: Self-pay

## 2018-11-23 ENCOUNTER — Encounter: Payer: Self-pay | Admitting: Family Medicine

## 2018-11-23 ENCOUNTER — Ambulatory Visit: Payer: Federal, State, Local not specified - PPO | Admitting: Family Medicine

## 2018-11-23 VITALS — BP 123/80 | HR 79 | Temp 97.3°F | Resp 16 | Ht 64.0 in | Wt 156.0 lb

## 2018-11-23 DIAGNOSIS — E663 Overweight: Secondary | ICD-10-CM

## 2018-11-23 DIAGNOSIS — I10 Essential (primary) hypertension: Secondary | ICD-10-CM

## 2018-11-23 DIAGNOSIS — G5603 Carpal tunnel syndrome, bilateral upper limbs: Secondary | ICD-10-CM

## 2018-11-23 NOTE — Assessment & Plan Note (Signed)
Worsened by carrying things at work Work note given

## 2018-11-23 NOTE — Assessment & Plan Note (Signed)
Discussed importance of healthy weight management Discussed diet and exercise  

## 2018-11-23 NOTE — Progress Notes (Signed)
Patient: Alyssa Thornton Female    DOB: [birth year]   63 y.o.   MRN: [id number] Visit Date: 11/23/2018  Today's Provider: Lavon Paganini, MD   Chief Complaint  Patient presents with   Follow-up   Hypertension   Subjective:     HPI   Hypertension, follow-up:  BP Readings from Last 3 Encounters:  11/23/18 123/80  10/20/18 140/82  06/16/18 124/62    She was last seen for hypertension 7 months ago.  BP at that visit was 126/85. Management since that visit includes; no changes.She reports good compliance with treatment. She is not having side effects. none She is not exercising. She is adherent to low salt diet.   Outside blood pressures normal. She is experiencing none.  Patient denies chest pain, chest pressure/discomfort, claudication, dyspnea, exertional chest pressure/discomfort, fatigue, irregular heart beat, lower extremity edema, near-syncope, orthopnea, palpitations, paroxysmal nocturnal dyspnea, syncope and tachypnea.   Cardiovascular risk factors include advanced age (older than 44 for men, 62 for women), dyslipidemia and hypertension.  Use of agents associated with hypertension: none and none.   ----------------------------------------------------------- Work note given for [employer] for working only up to 8 hours per day due to antihypertensives, carpal tunnel and trigger fingers  Allergies  Allergen Reactions   Diclofenac Sodium Diarrhea   Tramadol Diarrhea   Levaquin [Levofloxacin] Nausea And Vomiting     Current Outpatient Medications:    Cholecalciferol (VITAMIN D-3 PO), Take 4,000 Int'l Units by mouth 4 (four) times daily., Disp: , Rfl:    conjugated estrogens (PREMARIN) vaginal cream, 1 applicatorful vaginally 1-2 times weekly, Disp: 42.5 g, Rfl: 2   fluticasone (FLONASE) 50 MCG/ACT nasal spray, Place 1 spray into both nostrils daily as needed., Disp: 16 g, Rfl: 12   metroNIDAZOLE (METROGEL) 1 % gel, Apply 1 application topically daily.,  Disp: , Rfl:    montelukast (SINGULAIR) 10 MG tablet, TAKE 1 TABLET BY MOUTH ONCE DAILY, Disp: 90 tablet, Rfl: 3   Multiple Vitamin (MULTIVITAMIN) tablet, Take 1 tablet by mouth daily., Disp: , Rfl:    quinapril-hydrochlorothiazide (ACCURETIC) 10-12.5 MG tablet, Take 1 tablet by mouth daily., Disp: 90 tablet, Rfl: 3   vitamin C (ASCORBIC ACID) 500 MG tablet, Take 500 mg by mouth daily., Disp: , Rfl:    vitamin E (VITAMIN E) 400 UNIT capsule, Take 400 Units by mouth daily., Disp: , Rfl:    aspirin 81 MG tablet, Take 81 mg by mouth daily., Disp: , Rfl:    FINACEA 15 % cream, , Disp: , Rfl:    tamoxifen (NOLVADEX) 20 MG tablet, TAKE 1 TABLET BY MOUTH DAILY, Disp: 90 tablet, Rfl: 0  Review of Systems  Constitutional: Negative for appetite change, chills, fatigue and fever.  Respiratory: Negative for chest tightness and shortness of breath.   Cardiovascular: Negative for chest pain and palpitations.  Gastrointestinal: Negative for abdominal pain, nausea and vomiting.  Neurological: Negative for dizziness and weakness.    Social History   Tobacco Use   Smoking status: Never Smoker   Smokeless tobacco: Never Used  Substance Use Topics   Alcohol use: No      Objective:   BP 123/80 (BP Location: Left Arm, Patient Position: Sitting, Cuff Size: Large)    Pulse 79    Temp (!) 97.3 F (36.3 C) (Temporal)    Resp 16    Ht 5\' 4"  (1.626 m)    Wt 156 lb (70.8 kg)    SpO2 98%  BMI 26.78 kg/m  Vitals:   11/23/18 1429  BP: 123/80  Pulse: 79  Resp: 16  Temp: (!) 97.3 F (36.3 C)  TempSrc: Temporal  SpO2: 98%  Weight: 156 lb (70.8 kg)  Height: 5\' 4"  (1.626 m)     Physical Exam Vitals signs reviewed.  Constitutional:      General: She is not in acute distress.    Appearance: Normal appearance. She is well-developed. She is not diaphoretic.  HENT:     Head: Normocephalic and atraumatic.  Eyes:     General: No scleral icterus.    Conjunctiva/sclera: Conjunctivae normal.    Neck:     Musculoskeletal: Neck supple.     Thyroid: No thyromegaly.  Cardiovascular:     Rate and Rhythm: Normal rate and regular rhythm.     Pulses: Normal pulses.     Heart sounds: Normal heart sounds. No murmur.  Pulmonary:     Effort: Pulmonary effort is normal. No respiratory distress.     Breath sounds: Normal breath sounds. No wheezing, rhonchi or rales.  Musculoskeletal:     Right lower leg: No edema.     Left lower leg: No edema.  Lymphadenopathy:     Cervical: No cervical adenopathy.  Skin:    General: Skin is warm and dry.     Capillary Refill: Capillary refill takes less than 2 seconds.     Findings: No rash.  Neurological:     Mental Status: She is alert and oriented to person, place, and time. Mental status is at baseline.  Psychiatric:        Mood and Affect: Mood normal.        Behavior: Behavior normal.      No results found for any visits on 11/23/18.     Assessment & Plan   Problem List Items Addressed This Visit      Cardiovascular and Mediastinum   Essential hypertension - Primary    Well controlled Continue current medications Recheck metabolic panel F/u in 6 months       Relevant Orders   Basic Metabolic Panel (BMET)     Nervous and Auditory   Bilateral carpal tunnel syndrome    Worsened by carrying things at work Work note given        Other   Overweight    Discussed importance of healthy weight management Discussed diet and exercise           Return in about 6 months (around 05/26/2019) for chronic disease f/u.   The entirety of the information documented in the History of Present Illness, Review of Systems and Physical Exam were personally obtained by me. Portions of this information were initially documented by April Miller, CMA and reviewed by me for thoroughness and accuracy.    Prather Failla, Dionne Bucy, MD MPH [facility]

## 2018-11-23 NOTE — Assessment & Plan Note (Signed)
Well controlled Continue current medications Recheck metabolic panel F/u in 6 months  

## 2018-11-24 ENCOUNTER — Telehealth: Payer: Self-pay

## 2018-11-24 LAB — BASIC METABOLIC PANEL
BUN/Creatinine Ratio: 15 (ref 12–28)
BUN: 10 mg/dL (ref 8–27)
CO2: 24 mmol/L (ref 20–29)
Calcium: 9.5 mg/dL (ref 8.7–10.3)
Chloride: 101 mmol/L (ref 96–106)
Creatinine, Ser: 0.66 mg/dL (ref 0.57–1.00)
GFR calc Af Amer: 107 mL/min/{1.73_m2} (ref >59)
GFR calc non Af Amer: 93 mL/min/{1.73_m2} (ref >59)
Glucose: 76 mg/dL (ref 65–99)
Potassium: 3.8 mmol/L (ref 3.5–5.2)
Sodium: 141 mmol/L (ref 134–144)

## 2018-11-24 NOTE — Telephone Encounter (Signed)
Patient was advised.  

## 2018-11-24 NOTE — Telephone Encounter (Signed)
-----   Message from Virginia Crews, MD sent at 11/24/2018 11:23 AM EDT ----- Normal labs

## 2019-04-05 ENCOUNTER — Telehealth: Payer: Self-pay | Admitting: Family Medicine

## 2019-04-05 NOTE — Telephone Encounter (Signed)
She will need a virtual visit to be assessed we do not prescribe antibiotics without a visit

## 2019-04-05 NOTE — Telephone Encounter (Signed)
Patient scheduled for a My chart visit on 12/29 with [facility].

## 2019-04-05 NOTE — Telephone Encounter (Signed)
Medication Refill - Medication: something for sinus infection   Has the patient contacted their pharmacy? No. (Agent: If no, request that the patient contact the pharmacy for the refill.) (Agent: If yes, when and what did the pharmacy advise?)  Preferred Pharmacy (with phone number or street name): [facility] in [place]   Pt says that provider usually calls her something in because she has recurring sinus infections  Pt is asking if she is getting sinus infection because she is wear ing mask.  Says she has not been around anyone sick.   Agent: Please be advised that RX refills may take up to 3 business days. We ask that you follow-up with your pharmacy.

## 2019-04-05 NOTE — Telephone Encounter (Signed)
Please review for Dr. B ° ° °Thanks,  ° °-Leslee Haueter  °

## 2019-04-06 ENCOUNTER — Telehealth (INDEPENDENT_AMBULATORY_CARE_PROVIDER_SITE_OTHER): Payer: Federal, State, Local not specified - PPO | Admitting: Adult Health

## 2019-04-06 ENCOUNTER — Encounter: Payer: Self-pay | Admitting: Adult Health

## 2019-04-06 DIAGNOSIS — Z20828 Contact with and (suspected) exposure to other viral communicable diseases: Secondary | ICD-10-CM

## 2019-04-06 DIAGNOSIS — J069 Acute upper respiratory infection, unspecified: Secondary | ICD-10-CM | POA: Diagnosis not present

## 2019-04-06 DIAGNOSIS — R0981 Nasal congestion: Secondary | ICD-10-CM | POA: Diagnosis not present

## 2019-04-06 DIAGNOSIS — Z20822 Contact with and (suspected) exposure to covid-19: Secondary | ICD-10-CM

## 2019-04-06 MED ORDER — AMOXICILLIN 875 MG PO TABS: 875.0000 mg | ORAL_TABLET | Freq: Two times a day (BID) | ORAL | 0 refills | Status: DC

## 2019-04-06 NOTE — Progress Notes (Signed)
Patient: Alyssa Thornton Female    DOB: May 24, 1955   63 y.o.   MRN: [id number] Visit Date: 04/06/2019  Today's Provider: Marcille Buffy, FNP   Chief Complaint  Patient presents with  . Sinus Problem   Subjective:    Virtual Visit via Video Note  I connected with Alyssa Thornton on 04/06/19 at  3:40 PM EST by a video enabled telemedicine application and verified that I am speaking with the correct person using two identifiers.  Location: Patient: at home  Provider: Provider: Provider's office at  [facility], [place].    I discussed the limitations of evaluation and management by telemedicine and the availability of in person appointments. The patient expressed understanding and agreed to proceed.    I discussed the assessment and treatment plan with the patient. The patient was provided an opportunity to ask questions and all were answered. The patient agreed with the plan and demonstrated an understanding of the instructions.   The patient was advised to call back or seek an in-person evaluation if the symptoms worsen or if the condition fails to improve as anticipated.  I provided 15 minutes of non-face-to-face time during this encounter.      Sinus Problem The current episode started in the past 7 days. The problem has been gradually worsening since onset. There has been no fever. The pain is moderate. Associated symptoms include congestion (bloody/ yellow nasal discharge / denies any injury or head trauma. ), coughing (denies chest congestion/ has post nasal drip ), headaches, sinus pressure and sneezing. Pertinent negatives include no chills, diaphoresis, ear pain, hoarse voice, neck pain, shortness of breath or sore throat. (Patient reports bloody nose ) Past treatments include nothing.    Denies any fever or fever reducers.  Frontal sinus pressure. Taking singular and flonase as prescribed.  She reports history of sinusitis.    Denies covid exposure or other ill contacts.  Patient  denies any fever, body aches,chills, rash, chest pain, shortness of breath, nausea, vomiting, or diarrhea.   Allergies  Allergen Reactions  . Diclofenac Sodium Diarrhea  . Tramadol Diarrhea  . Levaquin [Levofloxacin] Nausea And Vomiting     Current Outpatient Medications:  .  aspirin 81 MG tablet, Take 81 mg by mouth daily., Disp: , Rfl:  .  Cholecalciferol (VITAMIN D-3 PO), Take 4,000 Int'l Units by mouth 4 (four) times daily., Disp: , Rfl:  .  conjugated estrogens (PREMARIN) vaginal cream, 1 applicatorful vaginally 1-2 times weekly, Disp: 42.5 g, Rfl: 2 .  fluticasone (FLONASE) 50 MCG/ACT nasal spray, Place 1 spray into both nostrils daily as needed., Disp: 16 g, Rfl: 12 .  metroNIDAZOLE (METROGEL) 1 % gel, Apply 1 application topically daily., Disp: , Rfl:  .  montelukast (SINGULAIR) 10 MG tablet, TAKE 1 TABLET BY MOUTH ONCE DAILY, Disp: 90 tablet, Rfl: 3 .  Multiple Vitamin (MULTIVITAMIN) tablet, Take 1 tablet by mouth daily., Disp: , Rfl:  .  quinapril-hydrochlorothiazide (ACCURETIC) 10-12.5 MG tablet, Take 1 tablet by mouth daily., Disp: 90 tablet, Rfl: 3 .  vitamin C (ASCORBIC ACID) 500 MG tablet, Take 500 mg by mouth daily., Disp: , Rfl:  .  vitamin E (VITAMIN E) 400 UNIT capsule, Take 400 Units by mouth daily., Disp: , Rfl:   Review of Systems  Constitutional: Negative for activity change, appetite change, chills, diaphoresis, fatigue, fever and unexpected weight change.  HENT: Positive for congestion (bloody/ yellow nasal discharge / denies any injury  or head trauma. ), postnasal drip, sinus pressure and sneezing. Negative for dental problem, drooling, ear discharge, ear pain, facial swelling, hearing loss, hoarse voice, mouth sores, nosebleeds, rhinorrhea, sinus pain and sore throat.   Eyes: Negative.   Respiratory: Positive for cough (denies chest congestion/ has post nasal drip ). Negative for apnea, choking, chest  tightness, shortness of breath, wheezing and stridor.   Cardiovascular: Negative for chest pain, palpitations and leg swelling.  Gastrointestinal: Negative.   Endocrine: Negative for polydipsia, polyphagia and polyuria.  Genitourinary: Negative.   Musculoskeletal: Negative.  Negative for neck pain.  Allergic/Immunologic:        -- Diclofenac Sodium -- Diarrhea  -- Tramadol -- Diarrhea  -- Levaquin (Levofloxacin) -- Nausea And Vomiting   Neurological: Positive for headaches.  Hematological: Negative for adenopathy.  Psychiatric/Behavioral: Negative.     Social History   Tobacco Use  . Smoking status: Never Smoker  . Smokeless tobacco: Never Used  Substance Use Topics  . Alcohol use: No      Objective:   There were no vitals taken for this visit. There were no vitals filed for this visit.There is no height or weight on file to calculate BMI.   Physical Exam    Patient is alert and oriented and responsive to questions Engages in conversation with provider. Speaks in full sentences without any pauses without any shortness of breath or distress.    No results found for any visits on 04/06/19.     Assessment & Plan     Sinus congestion  Upper respiratory tract infection, unspecified type  Suspected 2019 novel coronavirus infection   She requests Amoxicillin over providers recommendation of Augmentin for sinus symptoms. Discussed likely viral etiology and holding antibiotics for at least three more days, Office is closed in two days for holiday. Advised need of quarantine per CDC recommendations.  Discussed nasal saline rinse.  Also discussed to be seen in person if symptoms fail to improve or are persistent. Unable to see in office - due to Covid protocol in office. Patient verbalized understanding of all instructions given and denies any further questions at this time.  Discussed scheduling Covid testing.   Covid Testing Instructions for [place]:  If you/your  practice has a patient that needs an appointment, please have the patient text "COVID" to [phone], OR they can log on to [email or web address] to easily make an on-line appointment. Please note:  If you have a patient who does not have access to a smart phone or PC, you or the patient can call [phone] to get assistance.  I  Meds ordered this encounter  Medications  . amoxicillin (AMOXIL) 875 MG tablet    Sig: Take 1 tablet (875 mg total) by mouth 2 (two) times daily.    Dispense:  20 tablet    Refill:  0   Return in about 3 days (around 04/09/2019), or if symptoms worsen or fail to improve, for Go to Emergency room/ urgent care if worse.     The entirety of the information documented in the History of Present Illness, Review of Systems and Physical Exam were personally obtained by me. Portions of this information were initially documented by the  Certified Medical Assistant whose name is documented in [place] and reviewed by me for thoroughness and accuracy.  I have personally performed the exam and reviewed the chart and it is accurate to the best of my knowledge.  Haematologist has been used and any errors in dictation  or transcription are unintentional.  Kelby Aline. [facility]

## 2019-04-06 NOTE — Patient Instructions (Addendum)
Covid Testing Instructions for Alyssa Thornton:  If you/your practice has a patient that needs an appointment, please have the patient text "COVID" to [phone], OR they can log on to [email or web address] to easily make an on-line appointment. Please note:  If you have a patient who does not have access to a smart phone or PC, you or the patient can call [phone] to get assistance.  If you have a patient that needs a test and appointments are full, please send a message to the [place] or call [phone]. A provider may request that we overbook additional tests per day.  COVID-19 Frequently Asked Questions COVID-19 (coronavirus disease) is an infection that is caused by a large family of viruses. Some viruses cause illness in people and others cause illness in animals like camels, cats, and bats. In some cases, the viruses that cause illness in animals can spread to humans. Where did the coronavirus come from? In December 2019, [place] told the [facility]) of several cases of lung disease (human respiratory illness). These cases were linked to an open seafood and livestock market in the city of [place]. The link to the seafood and livestock market suggests that the virus may have spread from animals to humans. However, since that first outbreak in December, the virus has also been shown to spread from person to person. What is the name of the disease and the virus? Disease name Early on, this disease was called novel coronavirus. This is because scientists determined that the disease was caused by a new (novel) respiratory virus. The World Health Organization [facility]) has now named the disease COVID-19, or coronavirus disease. Virus name The virus that causes the disease is called severe acute respiratory syndrome coronavirus 2 (SARS-CoV-2). More information on disease and virus naming World Health Organization [facility]):  www.who.int/emergencies/diseases/novel-coronavirus-2019/technical-guidance/naming-the-coronavirus-disease-(covid-2019)-and-the-virus-that-causes-it Who is at risk for complications from coronavirus disease? Some people may be at higher risk for complications from coronavirus disease. This includes older adults and people who have chronic diseases, such as heart disease, diabetes, and lung disease. If you are at higher risk for complications, take these extra precautions:  Avoid close contact with people who are sick or have a fever or cough. Stay at least 3-6 ft (1-2 m) away from them, if possible.  Wash your hands often with soap and water for at least 20 seconds.  Avoid touching your face, mouth, nose, or eyes.  Keep supplies on hand at home, such as food, medicine, and cleaning supplies.  Stay home as much as possible.  Avoid social gatherings and travel. How does coronavirus disease spread? The virus that causes coronavirus disease spreads easily from person to person (is contagious). There are also cases of community-spread disease. This means the disease has spread to:  People who have no known contact with other infected people.  People who have not traveled to areas where there are known cases. It appears to spread from one person to another through droplets from coughing or sneezing. Can I get the virus from touching surfaces or objects? There is still a lot that we do not know about the virus that causes coronavirus disease. Scientists are basing a lot of information on what they know about similar viruses, such as:  Viruses cannot generally survive on surfaces for long. They need a human body (host) to survive.  It is more likely that the virus is spread by close contact with people who are sick (direct contact), such as  through: ? Shaking hands or hugging. ? Breathing in respiratory droplets that travel through the air. This can happen when an infected person coughs or  sneezes on or near other people.  It is less likely that the virus is spread when a person touches a surface or object that has the virus on it (indirect contact). The virus may be able to enter the body if the person touches a surface or object and then touches his or her face, eyes, nose, or mouth. Can a person spread the virus without having symptoms of the disease? It may be possible for the virus to spread before a person has symptoms of the disease, but this is most likely not the main way the virus is spreading. It is more likely for the virus to spread by being in close contact with people who are sick and breathing in the respiratory droplets of a sick person's cough or sneeze. What are the symptoms of coronavirus disease? Symptoms vary from person to person and can range from mild to severe. Symptoms may include:  Fever.  Cough.  Tiredness, weakness, or fatigue.  Fast breathing or feeling short of breath. These symptoms can appear anywhere from 2 to 14 days after you have been exposed to the virus. If you develop symptoms, call your health care provider. People with severe symptoms may need hospital care. If I am exposed to the virus, how long does it take before symptoms start? Symptoms of coronavirus disease may appear anywhere from 2 to 14 days after a person has been exposed to the virus. If you develop symptoms, call your health care provider. Should I be tested for this virus? Your health care provider will decide whether to test you based on your symptoms, history of exposure, and your risk factors. How does a health care provider test for this virus? Health care providers will collect samples to send for testing. Samples may include:  Taking a swab of fluid from the nose.  Taking fluid from the lungs by having you cough up mucus (sputum) into a sterile cup.  Taking a blood sample.  Taking a stool or urine sample. Is there a treatment or vaccine for this  virus? Currently, there is no vaccine to prevent coronavirus disease. Also, there are no medicines like antibiotics or antivirals to treat the virus. A person who becomes sick is given supportive care, which means rest and fluids. A person may also relieve his or her symptoms by using over-the-counter medicines that treat sneezing, coughing, and runny nose. These are the same medicines that a person takes for the common cold. If you develop symptoms, call your health care provider. People with severe symptoms may need hospital care. What can I do to protect myself and my family from this virus?     You can protect yourself and your family by taking the same actions that you would take to prevent the spread of other viruses. Take the following actions:  Wash your hands often with soap and water for at least 20 seconds. If soap and water are not available, use alcohol-based hand sanitizer.  Avoid touching your face, mouth, nose, or eyes.  Cough or sneeze into a tissue, sleeve, or elbow. Do not cough or sneeze into your hand or the air. ? If you cough or sneeze into a tissue, throw it away immediately and wash your hands.  Disinfect objects and surfaces that you frequently touch every day.  Avoid close contact with people who are  sick or have a fever or cough. Stay at least 3-6 ft (1-2 m) away from them, if possible.  Stay home if you are sick, except to get medical care. Call your health care provider before you get medical care.  Make sure your vaccines are up to date. Ask your health care provider what vaccines you need. What should I do if I need to travel? Follow travel recommendations from your local health authority, the CDC, and WHO. Travel information and advice  Centers for Disease Control and Prevention (CDC): BodyEditor.hu  World Health Organization [facility]): ThirdIncome.ca Know the  risks and take action to protect your health  You are at higher risk of getting coronavirus disease if you are traveling to areas with an outbreak or if you are exposed to travelers from areas with an outbreak.  Wash your hands often and practice good hygiene to lower the risk of catching or spreading the virus. What should I do if I am sick? General instructions to stop the spread of infection  Wash your hands often with soap and water for at least 20 seconds. If soap and water are not available, use alcohol-based hand sanitizer.  Cough or sneeze into a tissue, sleeve, or elbow. Do not cough or sneeze into your hand or the air.  If you cough or sneeze into a tissue, throw it away immediately and wash your hands.  Stay home unless you must get medical care. Call your health care provider or local health authority before you get medical care.  Avoid public areas. Do not take public transportation, if possible.  If you can, wear a mask if you must go out of the house or if you are in close contact with someone who is not sick. Keep your home clean  Disinfect objects and surfaces that are frequently touched every day. This may include: ? Counters and tables. ? Doorknobs and light switches. ? Sinks and faucets. ? Electronics such as phones, remote controls, keyboards, computers, and tablets.  Wash dishes in hot, soapy water or use a dishwasher. Air-dry your dishes.  Wash laundry in hot water. Prevent infecting other household members  Let healthy household members care for children and pets, if possible. If you have to care for children or pets, wash your hands often and wear a mask.  Sleep in a different bedroom or bed, if possible.  Do not share personal items, such as razors, toothbrushes, deodorant, combs, brushes, towels, and washcloths. Where to find more information Centers for Disease Control and Prevention (CDC)  Information and news updates:  https://www.butler-gonzalez.com/ World Health Organization [facility])  Information and news updates: MissExecutive.com.ee  Coronavirus health topic: https://www.castaneda.info/  Questions and answers on COVID-19: OpportunityDebt.at  Global tracker: who.sprinklr.com American Academy of Pediatrics (AAP)  Information for families: www.healthychildren.org/English/health-issues/conditions/chest-lungs/Pages/2019-Novel-Coronavirus.aspx The coronavirus situation is changing rapidly. Check your local health authority website or the CDC and [facility] websites for updates and news. When should I contact a health care provider?  Contact your health care provider if you have symptoms of an infection, such as fever or cough, and you: ? Have been near anyone who is known to have coronavirus disease. ? Have come into contact with a person who is suspected to have coronavirus disease. ? Have traveled outside of the country. When should I get emergency medical care?  Get help right away by calling your local emergency services (911 in the [place] if you have: ? Trouble breathing. ? Pain or pressure in your chest. ? Confusion. ?  Blue-tinged lips and fingernails. ? Difficulty waking from sleep. ? Symptoms that get worse. Let the emergency medical personnel know if you think you have coronavirus disease. Summary  A new respiratory virus is spreading from person to person and causing COVID-19 (coronavirus disease).  The virus that causes COVID-19 appears to spread easily. It spreads from one person to another through droplets from coughing or sneezing.  Older adults and those with chronic diseases are at higher risk of disease. If you are at higher risk for complications, take extra precautions.  There is currently no vaccine to prevent coronavirus disease. There are no medicines, such as antibiotics or antivirals, to treat the  virus.  You can protect yourself and your family by washing your hands often, avoiding touching your face, and covering your coughs and sneezes. This information is not intended to replace advice given to you by your health care provider. Make sure you discuss any questions you have with your health care provider. Document Released: 07/21/2018 Document Revised: 07/21/2018 Document Reviewed: 07/21/2018 Elsevier Patient Education  [place].  Upper Respiratory Infection, Adult An upper respiratory infection (URI) affects the nose, throat, and upper air passages. URIs are caused by germs (viruses). The most common type of URI is often called "the common cold." Medicines cannot cure URIs, but you can do things at home to relieve your symptoms. URIs usually get better within 7-10 days. Follow these instructions at home: Activity  Rest as needed.  If you have a fever, stay home from work or school until your fever is gone, or until your doctor says you may return to work or school. ? You should stay home until you cannot spread the infection anymore (you are not contagious). ? Your doctor may have you wear a face mask so you have less risk of spreading the infection. Relieving symptoms  Gargle with a salt-water mixture 3-4 times a day or as needed. To make a salt-water mixture, completely dissolve -1 tsp of salt in 1 cup of warm water.  Use a cool-mist humidifier to add moisture to the air. This can help you breathe more easily. Eating and drinking   Drink enough fluid to keep your pee (urine) pale yellow.  Eat soups and other clear broths. General instructions   Take over-the-counter and prescription medicines only as told by your doctor. These include cold medicines, fever reducers, and cough suppressants.  Do not use any products that contain nicotine or tobacco. These include cigarettes and e-cigarettes. If you need help quitting, ask your doctor.  Avoid being where people  are smoking (avoid secondhand smoke).  Make sure you get regular shots and get the flu shot every year.  Keep all follow-up visits as told by your doctor. This is important. How to avoid spreading infection to others   Wash your hands often with soap and water. If you do not have soap and water, use hand sanitizer.  Avoid touching your mouth, face, eyes, or nose.  Cough or sneeze into a tissue or your sleeve or elbow. Do not cough or sneeze into your hand or into the air. Contact a doctor if:  You are getting worse, not better.  You have any of these: ? A fever. ? Chills. ? Brown or red mucus in your nose. ? Yellow or brown fluid (discharge)coming from your nose. ? Pain in your face, especially when you bend forward. ? Swollen neck glands. ? Pain with swallowing. ? White areas in the back of your  throat. Get help right away if:  You have shortness of breath that gets worse.  You have very bad or constant: ? Headache. ? Ear pain. ? Pain in your forehead, behind your eyes, and over your cheekbones (sinus pain). ? Chest pain.  You have long-lasting (chronic) lung disease along with any of these: ? Wheezing. ? Long-lasting cough. ? Coughing up blood. ? A change in your usual mucus.  You have a stiff neck.  You have changes in your: ? Vision. ? Hearing. ? Thinking. ? Mood. Summary  An upper respiratory infection (URI) is caused by a germ called a virus. The most common type of URI is often called "the common cold."  URIs usually get better within 7-10 days.  Take over-the-counter and prescription medicines only as told by your doctor. This information is not intended to replace advice given to you by your health care provider. Make sure you discuss any questions you have with your health care provider. Document Released: 09/11/2007 Document Revised: 04/02/2018 Document Reviewed: 11/15/2016 Elsevier Patient Education  2020 [employer].

## 2019-04-26 ENCOUNTER — Other Ambulatory Visit: Payer: Self-pay | Admitting: Family Medicine

## 2019-04-26 DIAGNOSIS — I10 Essential (primary) hypertension: Secondary | ICD-10-CM

## 2019-04-26 MED ORDER — QUINAPRIL-HYDROCHLOROTHIAZIDE 10-12.5 MG PO TABS: 1.0000 | ORAL_TABLET | Freq: Every day | ORAL | 3 refills | Status: DC

## 2019-04-26 NOTE — Telephone Encounter (Signed)
Appears to have been refilled already

## 2019-04-26 NOTE — Telephone Encounter (Signed)
[facility] faxed refill request for the following medications:  quinapril-hydrochlorothiazide (ACCURETIC) 10-12.5 MG tablet   Please advise.

## 2019-05-15 ENCOUNTER — Ambulatory Visit: Payer: Federal, State, Local not specified - PPO | Attending: Internal Medicine

## 2019-05-15 DIAGNOSIS — Z23 Encounter for immunization: Secondary | ICD-10-CM | POA: Insufficient documentation

## 2019-05-15 NOTE — Progress Notes (Signed)
   Covid-19 Vaccination Clinic  Name:  Alyssa Thornton    MRN: [id number] DOB: 07-08-1955  05/15/2019  [name] was observed post Covid-19 immunization for 15 minutes without incidence. She was provided with Vaccine Information Sheet and instruction to access the V-Safe system.   [name] was instructed to call 911 with any severe reactions post vaccine: Marland Kitchen Difficulty breathing  . Swelling of your face and throat  . A fast heartbeat  . A bad rash all over your body  . Dizziness and weakness    Immunizations Administered    Name Date Dose VIS Date Route   Pfizer COVID-19 Vaccine 05/15/2019  9:45 AM 0.3 mL 03/19/2019 Intramuscular   Manufacturer: [place]   Lot: [id number]   [place]: [id number]

## 2019-05-17 ENCOUNTER — Ambulatory Visit: Payer: Federal, State, Local not specified - PPO

## 2019-05-24 ENCOUNTER — Other Ambulatory Visit: Payer: Self-pay | Admitting: Family Medicine

## 2019-05-24 DIAGNOSIS — J302 Other seasonal allergic rhinitis: Secondary | ICD-10-CM

## 2019-05-24 MED ORDER — FLUTICASONE PROPIONATE 50 MCG/ACT NA SUSP: 1.0000 | Freq: Every day | NASAL | 12 refills | Status: DC | PRN

## 2019-05-24 NOTE — Telephone Encounter (Signed)
[facility] faxed refill request for the following medications:  fluticasone (FLONASE) 50 MCG/ACT nasal spray   Please advise.  Thanks, TGH  

## 2019-05-26 ENCOUNTER — Other Ambulatory Visit: Payer: Self-pay

## 2019-05-26 ENCOUNTER — Ambulatory Visit: Payer: Federal, State, Local not specified - PPO | Admitting: Family Medicine

## 2019-05-26 ENCOUNTER — Encounter: Payer: Self-pay | Admitting: Family Medicine

## 2019-05-26 VITALS — BP 138/81 | HR 75 | Temp 96.9°F | Resp 16 | Ht 63.0 in | Wt 157.0 lb

## 2019-05-26 DIAGNOSIS — R739 Hyperglycemia, unspecified: Secondary | ICD-10-CM | POA: Diagnosis not present

## 2019-05-26 DIAGNOSIS — I10 Essential (primary) hypertension: Secondary | ICD-10-CM | POA: Diagnosis not present

## 2019-05-26 DIAGNOSIS — E782 Mixed hyperlipidemia: Secondary | ICD-10-CM

## 2019-05-26 DIAGNOSIS — R748 Abnormal levels of other serum enzymes: Secondary | ICD-10-CM | POA: Diagnosis not present

## 2019-05-26 DIAGNOSIS — D696 Thrombocytopenia, unspecified: Secondary | ICD-10-CM | POA: Insufficient documentation

## 2019-05-26 DIAGNOSIS — E663 Overweight: Secondary | ICD-10-CM | POA: Diagnosis not present

## 2019-05-26 NOTE — Progress Notes (Signed)
Patient: Alyssa Thornton Female    DOB: [birth year]   64 y.o.   MRN: [id number] Visit Date: 05/26/2019  Today's Provider: Lavon Paganini, MD   Chief Complaint  Patient presents with  . Hypertension   Subjective:    I [place] S. Dimas, CMA, am acting as scribe for Lavon Paganini, MD.   HPI  Hypertension, follow-up:   BP Readings from Last 3 Encounters:  05/26/19 138/81  11/23/18 123/80  10/20/18 140/82    She was last seen for hypertension 6 months ago.  BP at that visit was 123/80. Management changes since that visit include checking labs. She reports excellent compliance with treatment. She is not having side effects.  She is exercising. She is adherent to low salt diet.   Outside blood pressures are stable. She is experiencing none.  Patient denies chest pain and lower extremity edema.   Cardiovascular risk factors include hypertension.  Use of agents associated with hypertension: none.     Weight trend: stable Wt Readings from Last 3 Encounters:  05/26/19 157 lb (71.2 kg)  11/23/18 156 lb (70.8 kg)  10/20/18 158 lb (71.7 kg)    Current diet: in general, a "healthy" diet    ------------------------------------------------------------------------  Lipid/Cholesterol, Follow-up:   Last seen for this1 years ago.  Management changes since that visit include check labs. Patient not currently on any medication.  Last Lipid Panel:    Component Value Date/Time   CHOL 151 05/05/2018 0846   TRIG 66 05/05/2018 0846   HDL 63 05/05/2018 0846   CHOLHDL 2.4 05/05/2018 0846   CHOLHDL 2.5 12/17/2016 0843   LDLCALC 75 05/05/2018 0846   LDLCALC 71 12/17/2016 0843    Risk factors for vascular disease include hypertension   Current symptoms include none and have been stable. Weight trend: stable Prior visit with dietician: no Current diet: in general, a "healthy" diet   Current exercise: walking  Wt Readings from Last 3 Encounters:  05/26/19 157 lb  (71.2 kg)  11/23/18 156 lb (70.8 kg)  10/20/18 158 lb (71.7 kg)    -------------------------------------------------------------------  Allergies  Allergen Reactions  . Diclofenac Sodium Diarrhea  . Tramadol Diarrhea  . Levaquin [Levofloxacin] Nausea And Vomiting     Current Outpatient Medications:  .  Cholecalciferol (VITAMIN D-3 PO), Take 4,000 Int'l Units by mouth 4 (four) times daily., Disp: , Rfl:  .  conjugated estrogens (PREMARIN) vaginal cream, 1 applicatorful vaginally 1-2 times weekly, Disp: 42.5 g, Rfl: 2 .  fluticasone (FLONASE) 50 MCG/ACT nasal spray, Place 1 spray into both nostrils daily as needed., Disp: 16 g, Rfl: 12 .  metroNIDAZOLE (METROGEL) 1 % gel, Apply 1 application topically daily., Disp: , Rfl:  .  montelukast (SINGULAIR) 10 MG tablet, TAKE 1 TABLET BY MOUTH ONCE DAILY, Disp: 90 tablet, Rfl: 3 .  Multiple Vitamin (MULTIVITAMIN) tablet, Take 1 tablet by mouth daily., Disp: , Rfl:  .  quinapril-hydrochlorothiazide (ACCURETIC) 10-12.5 MG tablet, Take 1 tablet by mouth daily., Disp: 90 tablet, Rfl: 3 .  vitamin C (ASCORBIC ACID) 500 MG tablet, Take 500 mg by mouth daily., Disp: , Rfl:  .  vitamin E (VITAMIN E) 400 UNIT capsule, Take 400 Units by mouth daily., Disp: , Rfl:   Review of Systems  Constitutional: Negative.   Respiratory: Negative.   Cardiovascular: Negative.   Genitourinary: Negative.   Neurological: Negative.   Psychiatric/Behavioral: Negative.     Social History   Tobacco Use  . Smoking status:  Never Smoker  . Smokeless tobacco: Never Used  Substance Use Topics  . Alcohol use: No      Objective:   BP 138/81 (BP Location: Right Arm, Patient Position: Sitting, Cuff Size: Normal)   Pulse 75   Temp (!) 96.9 F (36.1 C) (Temporal)   Resp 16   Ht 5\' 3"  (1.6 m)   Wt 157 lb (71.2 kg)   BMI 27.81 kg/m  Vitals:   05/26/19 1004  BP: 138/81  Pulse: 75  Resp: 16  Temp: (!) 96.9 F (36.1 C)  TempSrc: Temporal  Weight: 157 lb (71.2  kg)  Height: 5\' 3"  (1.6 m)  Body mass index is 27.81 kg/m.   Physical Exam Vitals reviewed.  Constitutional:      General: She is not in acute distress.    Appearance: Normal appearance. She is well-developed. She is not diaphoretic.  HENT:     Head: Normocephalic and atraumatic.  Eyes:     General: No scleral icterus.    Conjunctiva/sclera: Conjunctivae normal.  Neck:     Thyroid: No thyromegaly.  Cardiovascular:     Rate and Rhythm: Normal rate and regular rhythm.     Pulses: Normal pulses.     Heart sounds: Normal heart sounds. No murmur.  Pulmonary:     Effort: Pulmonary effort is normal. No respiratory distress.     Breath sounds: Normal breath sounds. No wheezing, rhonchi or rales.  Abdominal:     General: There is no distension.     Palpations: Abdomen is soft.     Tenderness: There is no abdominal tenderness.  Musculoskeletal:     Cervical back: Neck supple.     Right lower leg: No edema.     Left lower leg: No edema.  Lymphadenopathy:     Cervical: No cervical adenopathy.  Skin:    General: Skin is warm and dry.     Findings: No rash.  Neurological:     Mental Status: She is alert and oriented to person, place, and time. Mental status is at baseline.  Psychiatric:        Mood and Affect: Mood normal.        Behavior: Behavior normal.      No results found for any visits on 05/26/19.     Assessment & Plan    Problem List Items Addressed This Visit      Cardiovascular and Mediastinum   Essential hypertension - Primary    Well controlled Continue current medications Recheck metabolic panel F/u in 6 months      Relevant Orders   Comprehensive metabolic panel     Other   Hyperlipidemia    Not currently on a statin Recheck FLP and CMP Calculate ASCVD risk to determine need for primary prevention      Relevant Orders   Comprehensive metabolic panel   Lipid panel   Hyperglycemia    Recheck hemoglobin A1c Advised on low-carb diet       Relevant Orders   Hemoglobin A1c   Overweight    Discussed importance of healthy weight management Discussed diet and exercise       Relevant Orders   Comprehensive metabolic panel   Lipid panel   CBC   Hemoglobin A1c   Thrombocytopenia (HCC)    Mild Asymptomatic Recheck CBC      Relevant Orders   CBC     Also discussed importance of Pneumovax and Tdap.  As she is currently in the middle of her  Covid vaccination series, we will not vaccinated this time, but she will read about these and consider and we will discuss again at next visit  Return in about 6 months (around 11/23/2019) for chronic disease f/u.   The entirety of the information documented in the History of Present Illness, Review of Systems and Physical Exam were personally obtained by me. Portions of this information were initially documented by Lynford Humphrey, CMA and reviewed by me for thoroughness and accuracy.    Lasheka Kempner, Dionne Bucy, MD MPH [facility]

## 2019-05-26 NOTE — Patient Instructions (Signed)
Pneumococcal Vaccine, Polyvalent solution for injection What is this medicine? PNEUMOCOCCAL VACCINE, POLYVALENT (NEU mo KOK al vak SEEN, pol ee VEY luhnt) is a vaccine to prevent pneumococcus bacteria infection. These bacteria are a major cause of ear infections, Strep throat infections, and serious pneumonia, meningitis, or blood infections worldwide. These vaccines help the body to produce antibodies (protective substances) that help your body defend against these bacteria. This vaccine is recommended for people 2 years of age and older with health problems. It is also recommended for all adults over 50 years old. This vaccine will not treat an infection. This medicine may be used for other purposes; ask your health care provider or pharmacist if you have questions. COMMON BRAND NAME(S): Pneumovax 23 What should I tell my health care provider before I take this medicine? They need to know if you have any of these conditions:  bleeding problems  bone marrow or organ transplant  cancer, Hodgkin's disease  fever  infection  immune system problems  low platelet count in the blood  seizures  an unusual or allergic reaction to pneumococcal vaccine, diphtheria toxoid, other vaccines, latex, other medicines, foods, dyes, or preservatives  pregnant or trying to get pregnant  breast-feeding How should I use this medicine? This vaccine is for injection into a muscle or under the skin. It is given by a health care professional. A copy of Vaccine Information Statements will be given before each vaccination. Read this sheet carefully each time. The sheet may change frequently. Talk to your pediatrician regarding the use of this medicine in children. While this drug may be prescribed for children as young as 2 years of age for selected conditions, precautions do apply. Overdosage: If you think you have taken too much of this medicine contact a poison control center or emergency room at  once. NOTE: This medicine is only for you. Do not share this medicine with others. What if I miss a dose? It is important not to miss your dose. Call your doctor or health care professional if you are unable to keep an appointment. What may interact with this medicine?  medicines for cancer chemotherapy  medicines that suppress your immune function  medicines that treat or prevent blood clots like warfarin, enoxaparin, and dalteparin  steroid medicines like prednisone or cortisone This list may not describe all possible interactions. Give your health care provider a list of all the medicines, herbs, non-prescription drugs, or dietary supplements you use. Also tell them if you smoke, drink alcohol, or use illegal drugs. Some items may interact with your medicine. What should I watch for while using this medicine? Mild fever and pain should go away in 3 days or less. Report any unusual symptoms to your doctor or health care professional. What side effects may I notice from receiving this medicine? Side effects that you should report to your doctor or health care professional as soon as possible:  allergic reactions like skin rash, itching or hives, swelling of the face, lips, or tongue  breathing problems  confused  fever over 102 degrees F  pain, tingling, numbness in the hands or feet  seizures  unusual bleeding or bruising  unusual muscle weakness Side effects that usually do not require medical attention (report to your doctor or health care professional if they continue or are bothersome):  aches and pains  diarrhea  fever of 102 degrees F or less  headache  irritable  loss of appetite  pain, tender at site where injected    trouble sleeping This list may not describe all possible side effects. Call your doctor for medical advice about side effects. You may report side effects to FDA at 1-800-FDA-1088. Where should I keep my medicine? This does not apply. This  vaccine is given in a clinic, pharmacy, doctor's office, or other health care setting and will not be stored at home. NOTE: This sheet is a summary. It may not cover all possible information. If you have questions about this medicine, talk to your doctor, pharmacist, or health care provider.  2020 Elsevier/Gold Standard (2007-10-30 14:32:37)  

## 2019-05-26 NOTE — Assessment & Plan Note (Signed)
Recheck hemoglobin A1c Advised on low-carb diet

## 2019-05-26 NOTE — Assessment & Plan Note (Signed)
Well controlled Continue current medications Recheck metabolic panel F/u in 6 months  

## 2019-05-26 NOTE — Assessment & Plan Note (Signed)
Discussed importance of healthy weight management Discussed diet and exercise  

## 2019-05-26 NOTE — Assessment & Plan Note (Signed)
Not currently on a statin Recheck FLP and CMP Calculate ASCVD risk to determine need for primary prevention

## 2019-05-26 NOTE — Assessment & Plan Note (Signed)
Mild Asymptomatic Recheck CBC

## 2019-05-27 ENCOUNTER — Telehealth: Payer: Self-pay

## 2019-05-27 LAB — COMPREHENSIVE METABOLIC PANEL
ALT: 31 IU/L (ref 0–32)
AST: 30 IU/L (ref 0–40)
Albumin/Globulin Ratio: 2 (ref 1.2–2.2)
Albumin: 4.4 g/dL (ref 3.8–4.8)
Alkaline Phosphatase: 153 IU/L — ABNORMAL HIGH (ref 39–117)
BUN/Creatinine Ratio: 16 (ref 12–28)
BUN: 10 mg/dL (ref 8–27)
Bilirubin Total: 0.8 mg/dL (ref 0.0–1.2)
CO2: 24 mmol/L (ref 20–29)
Calcium: 9.2 mg/dL (ref 8.7–10.3)
Chloride: 103 mmol/L (ref 96–106)
Creatinine, Ser: 0.63 mg/dL (ref 0.57–1.00)
GFR calc Af Amer: 109 mL/min/{1.73_m2} (ref >59)
GFR calc non Af Amer: 94 mL/min/{1.73_m2} (ref >59)
Globulin, Total: 2.2 g/dL (ref 1.5–4.5)
Glucose: 103 mg/dL — ABNORMAL HIGH (ref 65–99)
Potassium: 4 mmol/L (ref 3.5–5.2)
Sodium: 142 mmol/L (ref 134–144)
Total Protein: 6.6 g/dL (ref 6.0–8.5)

## 2019-05-27 LAB — CBC
Hematocrit: 39.8 % (ref 34.0–46.6)
Hemoglobin: 14.3 g/dL (ref 11.1–15.9)
MCH: 31.8 pg (ref 26.6–33.0)
MCHC: 35.9 g/dL — ABNORMAL HIGH (ref 31.5–35.7)
MCV: 88 fL (ref 79–97)
Platelets: 112 10*3/uL — ABNORMAL LOW (ref 150–450)
RBC: 4.5 x10E6/uL (ref 3.77–5.28)
RDW: 12.1 % (ref 11.7–15.4)
WBC: 4.5 10*3/uL (ref 3.4–10.8)

## 2019-05-27 LAB — HEMOGLOBIN A1C
Est. average glucose Bld gHb Est-mCnc: 123 mg/dL
Hgb A1c MFr Bld: 5.9 % — ABNORMAL HIGH (ref 4.8–5.6)

## 2019-05-27 LAB — LIPID PANEL
Chol/HDL Ratio: 2.5 ratio (ref 0.0–4.4)
Cholesterol, Total: 180 mg/dL (ref 100–199)
HDL: 71 mg/dL (ref >39)
LDL Chol Calc (NIH): 91 mg/dL (ref 0–99)
Triglycerides: 102 mg/dL (ref 0–149)
VLDL Cholesterol Cal: 18 mg/dL (ref 5–40)

## 2019-05-27 NOTE — Telephone Encounter (Signed)
-----   Message from Virginia Crews, MD sent at 05/27/2019  1:04 PM EST ----- Normal kidney function, electrolytes.  Alkaline phosphatase, a liver function enzyme, remains elevated.  It has not changed over the last 3 years.  I would like to see if the lab can add on GGT and 5 nucleotidase to evaluate further.  Cholesterol is well controlled.  Normal blood counts, except platelets remain low.  We will continue to monitor these each 6 months. Hemoglobin A1c, 3 month avg of blood sugars, is in prediabetic range.  In order to prevent progression to diabetes, recommend low carb diet and regular exercise

## 2019-05-27 NOTE — Telephone Encounter (Signed)
Called [facility] to add test. Patient advised as below.

## 2019-05-31 ENCOUNTER — Telehealth: Payer: Self-pay

## 2019-05-31 DIAGNOSIS — R748 Abnormal levels of other serum enzymes: Secondary | ICD-10-CM

## 2019-05-31 LAB — NUCLEOTIDASE, 5', BLOOD: 5-Nucleotidase: 4 IU/L (ref 0–10)

## 2019-05-31 LAB — SPECIMEN STATUS REPORT

## 2019-05-31 LAB — GAMMA GT: GGT: 67 IU/L — ABNORMAL HIGH (ref 0–60)

## 2019-05-31 NOTE — Telephone Encounter (Signed)
Patient advised as below. Patient verbalizes understanding and is in agreement with treatment plan.  

## 2019-05-31 NOTE — Telephone Encounter (Signed)
-----  Message from Virginia Crews, MD sent at 05/31/2019  2:08 PM EST ----- Follow-up testing on elevated alk phos show that this likely is elevated from a liver cause.  Recommend right upper quadrant ultrasound.  Okay to order if patient agrees.

## 2019-06-02 ENCOUNTER — Telehealth: Payer: Self-pay | Admitting: Family Medicine

## 2019-06-02 NOTE — Telephone Encounter (Signed)
Pt was looking through her mychart and seen the Rx for her Cholecalciferol (VITAMIN D-3 PO) And she gets this over the counter but she noticed the Rx said to take 4,000 units 4x a day/ Pt wants to know if she should be taking that much or if that is an error and if so, how much should she be taking/ please advise

## 2019-06-02 NOTE — Telephone Encounter (Signed)
Please verify dosage patient has been taking at home currently per day ?  She has not had a vitamin D lab level recently. If she is taking over the counter, I recommend 2,000 international units of Vitamin D 3 by mouth once daily and having a vitamin D level checked in the future. Has she had vitamin D deficiency in the past ?  We can update medication list to refect dose once verified for Vitamin D 3 over the counter.

## 2019-06-03 ENCOUNTER — Ambulatory Visit
Admission: RE | Admit: 2019-06-03 | Discharge: 2019-06-03 | Disposition: A | Discharge status: Home / Self Care | Payer: Federal, State, Local not specified - PPO | Source: Ambulatory Visit | Attending: Family Medicine | Admitting: Family Medicine

## 2019-06-03 ENCOUNTER — Other Ambulatory Visit: Payer: Self-pay

## 2019-06-03 DIAGNOSIS — R748 Abnormal levels of other serum enzymes: Secondary | ICD-10-CM | POA: Diagnosis not present

## 2019-06-03 NOTE — Telephone Encounter (Signed)
Pt called back and given information per Laverna Peace; she verbalized understanding, and says she has not been vit D deficient in the past;  the pt states she is taking OTC D3; she will verify the dosage; will route to office for notification.

## 2019-06-03 NOTE — Telephone Encounter (Signed)
Please verify dosage patient has been taking at home currently per day ?  She has not had a vitamin D lab level recently. If she is taking over the counter, I recommend 2,000 international units of Vitamin D 3 by mouth once daily and having a vitamin D level checked in the future. Has she had vitamin D deficiency in the past ?  We can update medication list to refect dose once verified for Vitamin D 3 over the counter.

## 2019-06-03 NOTE — Telephone Encounter (Signed)
lmtcb okay for [facility] triage to speak with patient about message below. KW

## 2019-06-08 ENCOUNTER — Ambulatory Visit: Payer: Federal, State, Local not specified - PPO | Attending: Internal Medicine

## 2019-06-08 DIAGNOSIS — Z23 Encounter for immunization: Secondary | ICD-10-CM | POA: Insufficient documentation

## 2019-06-08 NOTE — Progress Notes (Signed)
   Covid-19 Vaccination Clinic  Name:  [name]    MRN: [id number] DOB: May 12, 1955  06/08/2019  [name] was observed post Covid-19 immunization for 15 minutes without incident. She was provided with Vaccine Information Sheet and instruction to access the V-Safe system.   [name] was instructed to call 911 with any severe reactions post vaccine: Marland Kitchen Difficulty breathing  . Swelling of face and throat  . A fast heartbeat  . A bad rash all over body  . Dizziness and weakness   Immunizations Administered    Name Date Dose VIS Date Route   Pfizer COVID-19 Vaccine 06/08/2019  8:52 AM 0.3 mL 03/19/2019 Intramuscular   Manufacturer: [place]   Lot: [id number]   [place]: [id number]

## 2019-06-28 NOTE — Progress Notes (Signed)
PCP: Virginia Crews, MD   Chief Complaint  Patient presents with  . Gynecologic Exam    HPI:      [name] is a 64 y.o. G0P0000 who LMP was No LMP recorded. Patient is postmenopausal., presents today for her annual examination.  Her menses are absent due to menopause. She does not have PMB. No vasomotor sx.  Sex activity: single partner, not as sex active anymore; husband with recent prostate cancer tx. She does have vaginal dryness. Has used premarin vag crm, occas lubricant, without full sx relief, but not using vag ERT currently. Will f/u prn.  Last Pap: 04/22/17 Results were: no abnormalities /neg HPV DNA.  Hx of STDs: none  Last mammogram: 09/16/18 at [facility], Results were: normal--routine follow-up in 12 months. Released by Dr. Bary Castilla, was on tamoxifen for 5 yrs due to LT breast atypical hyperplasia 2014; stopped last yr.  There is no FH of breast cancer. There is a FH of ovarian cancer in her MGM, but pt doesn't meet genetic testing guidelines with her [employer]. The patient does do self-breast exams.  Colonoscopy: ~3 yrs ago, repeat due after 10 yrs, with Dr. Bary Castilla.  DEXA: 2018/2020--stable osteopenia in hip, osteoporosis in spine.  Tobacco use: The patient denies current or previous tobacco use. Alcohol use: none  No drug use Exercise: very active  She does get adequate calcium and Vitamin D in her diet.  Labs with PCP.   Past Medical History:  Diagnosis Date  . Atypical hyperplasia of left breast 07/02/2013   1 mm area of atypia associated with a calcified papilloma.  . Fatty liver   . Hypertension   . Osteopenia   . Postmenopausal atrophic vaginitis     Past Surgical History:  Procedure Laterality Date  . bartholins gland surgery    . BREAST BIOPSY Left 11/15/2005   LEFT BREAST, CORE BIOPSY:: STROMAL SCLEROSIS WITHIN BENIGN ATROPHIC BREAST TISSUE  . BREAST BIOPSY Left July 02, 2013   left breast papilloma with focal atypia  .  BREAST EXCISIONAL BIOPSY Left 2015  . COLONOSCOPY  2007-2008, 2015   Dr Byrnett/Dr Allen Norris  . TONSILLECTOMY AND ADENOIDECTOMY  1960    Family History  Problem Relation Age of Onset  . Heart disease Father   . Hypertension Father   . Cancer Sister        bone marrow cancer  . Diabetes Mother        type 2  . Cirrhosis Mother   . Hypercholesterolemia Brother   . Hyperlipidemia Brother   . Cancer - Ovarian Maternal Grandmother        90  . Stomach cancer Other 72  . Breast cancer Neg Hx     Social History   Socioeconomic History  . Marital status: Married    Spouse name: Not on file  . Number of children: Not on file  . Years of education: Not on file  . Highest education level: Not on file  Occupational History  . Not on file  Tobacco Use  . Smoking status: Never Smoker  . Smokeless tobacco: Never Used  Substance and Sexual Activity  . Alcohol use: No  . Drug use: No  . Sexual activity: Not Currently    Birth control/protection: Post-menopausal  Other Topics Concern  . Not on file  Social History Narrative  . Not on file   Social Determinants of Health   Financial Resource Strain:   . Difficulty of Paying  Living Expenses:   Food Insecurity:   . Worried About Charity fundraiser in the Last Year:   . Arboriculturist in the Last Year:   Transportation Needs:   . Film/video editor (Medical):   Marland Kitchen Lack of Transportation (Non-Medical):   Physical Activity:   . Days of Exercise per Week:   . Minutes of Exercise per Session:   Stress:   . Feeling of Stress :   Social Connections:   . Frequency of Communication with Friends and Family:   . Frequency of Social Gatherings with Friends and Family:   . Attends Religious Services:   . Active Member of Clubs or Organizations:   . Attends Archivist Meetings:   Marland Kitchen Marital Status:   Intimate Partner Violence:   . Fear of Current or Ex-Partner:   . Emotionally Abused:   Marland Kitchen Physically Abused:   . Sexually  Abused:     Current Meds  Medication Sig  . Cholecalciferol (VITAMIN D-3 PO) Take 4,000 Int'l Units by mouth once. Once daily  . fluticasone (FLONASE) 50 MCG/ACT nasal spray Place 1 spray into both nostrils daily as needed.  . metroNIDAZOLE (METROGEL) 1 % gel Apply 1 application topically daily.  . montelukast (SINGULAIR) 10 MG tablet TAKE 1 TABLET BY MOUTH ONCE DAILY  . Multiple Vitamin (MULTIVITAMIN) tablet Take 1 tablet by mouth daily.  . quinapril-hydrochlorothiazide (ACCURETIC) 10-12.5 MG tablet Take 1 tablet by mouth daily.  . vitamin C (ASCORBIC ACID) 500 MG tablet Take 500 mg by mouth daily.  . vitamin E (VITAMIN E) 400 UNIT capsule Take 400 Units by mouth daily.      ROS:  Review of Systems  Constitutional: Negative for fatigue, fever and unexpected weight change.  Respiratory: Negative for cough, shortness of breath and wheezing.   Cardiovascular: Negative for chest pain, palpitations and leg swelling.  Gastrointestinal: Negative for blood in stool, constipation, diarrhea, nausea and vomiting.  Endocrine: Negative for cold intolerance, heat intolerance and polyuria.  Genitourinary: Negative for dyspareunia, dysuria, flank pain, frequency, genital sores, hematuria, menstrual problem, pelvic pain, urgency, vaginal bleeding, vaginal discharge and vaginal pain.  Musculoskeletal: Negative for back pain, joint swelling and myalgias.  Skin: Negative for rash.  Neurological: Negative for dizziness, syncope, light-headedness, numbness and headaches.  Hematological: Negative for adenopathy.  Psychiatric/Behavioral: Negative for agitation, confusion, sleep disturbance and suicidal ideas. The patient is not nervous/anxious.      Objective: BP 110/70   Ht 5\' 2"  (1.575 m)   Wt 145 lb (65.8 kg)   BMI 26.52 kg/m    Physical Exam Constitutional:      Appearance: She is well-developed.  Genitourinary:     Vulva, vagina, uterus, right adnexa and left adnexa normal.     No vulval  lesion or tenderness noted.     Vaginal atrophic mucosa present.     No vaginal discharge, erythema or tenderness.     No cervical motion tenderness or polyp.     Uterus is not enlarged or tender.     No right or left adnexal mass present.     Right adnexa not tender.     Left adnexa not tender.  Neck:     Thyroid: No thyromegaly.  Cardiovascular:     Rate and Rhythm: Normal rate and regular rhythm.     Heart sounds: Normal heart sounds. No murmur.  Pulmonary:     Effort: Pulmonary effort is normal.     Breath sounds: Normal  breath sounds.  Chest:     Breasts:        Right: No mass, nipple discharge, skin change or tenderness.        Left: No mass, nipple discharge, skin change or tenderness.  Abdominal:     Palpations: Abdomen is soft.     Tenderness: There is no abdominal tenderness. There is no guarding.  Musculoskeletal:        General: Normal range of motion.     Cervical back: Normal range of motion.  Neurological:     General: No focal deficit present.     Mental Status: She is alert and oriented to person, place, and time.     Cranial Nerves: No cranial nerve deficit.  Skin:    General: Skin is warm and dry.  Psychiatric:        Mood and Affect: Mood normal.        Behavior: Behavior normal.        Thought Content: Thought content normal.        Judgment: Judgment normal.  Vitals reviewed.     Assessment/Plan:  Encounter for annual routine gynecological examination  Encounter for screening mammogram for malignant neoplasm of breast - Plan: MM 3D SCREEN BREAST BILATERAL; pt to sched mammo. No longer on tamoxifen.  Family history of ovarian cancer--pt doesn't meet [employer] Fed testing guidelines.  Postmenopausal atrophic vaginitis--will call for prem vag crm RF prn.           GYN counsel breast self exam, mammography screening, menopause, adequate intake of calcium and vitamin D, diet and exercise    F/U  Return in about 1 year (around 06/28/2020).  Kaya Klausing B.  Shelda Truby, PA-C 06/29/2019 9:57 AM

## 2019-06-29 ENCOUNTER — Other Ambulatory Visit: Payer: Self-pay

## 2019-06-29 ENCOUNTER — Ambulatory Visit (INDEPENDENT_AMBULATORY_CARE_PROVIDER_SITE_OTHER): Payer: Federal, State, Local not specified - PPO | Admitting: Obstetrics and Gynecology

## 2019-06-29 ENCOUNTER — Encounter: Payer: Self-pay | Admitting: Obstetrics and Gynecology

## 2019-06-29 VITALS — BP 110/70 | Ht 62.0 in | Wt 145.0 lb

## 2019-06-29 DIAGNOSIS — Z1231 Encounter for screening mammogram for malignant neoplasm of breast: Secondary | ICD-10-CM

## 2019-06-29 DIAGNOSIS — Z8041 Family history of malignant neoplasm of ovary: Secondary | ICD-10-CM | POA: Diagnosis not present

## 2019-06-29 DIAGNOSIS — Z01419 Encounter for gynecological examination (general) (routine) without abnormal findings: Secondary | ICD-10-CM | POA: Diagnosis not present

## 2019-06-29 DIAGNOSIS — N952 Postmenopausal atrophic vaginitis: Secondary | ICD-10-CM

## 2019-06-29 NOTE — Patient Instructions (Signed)
I value your feedback and entrusting us with your care. If you get a [facility] patient survey, I would appreciate you taking the time to let us know about your experience today. Thank you!  As of March 18, 2019, your lab results will be released to your MyChart immediately, before I even have a chance to see them. Please give me time to review them and contact you if there are any abnormalities. Thank you for your patience.     [facility]: [phone]  

## 2019-07-15 ENCOUNTER — Other Ambulatory Visit: Payer: Self-pay | Admitting: General Surgery

## 2019-07-15 DIAGNOSIS — R748 Abnormal levels of other serum enzymes: Secondary | ICD-10-CM

## 2019-07-15 NOTE — Progress Notes (Signed)
ana

## 2019-07-16 ENCOUNTER — Other Ambulatory Visit
Admission: RE | Admit: 2019-07-16 | Discharge: 2019-07-16 | Disposition: A | Discharge status: Home / Self Care | Payer: Federal, State, Local not specified - PPO | Source: Ambulatory Visit | Attending: General Surgery | Admitting: General Surgery

## 2019-07-16 DIAGNOSIS — R748 Abnormal levels of other serum enzymes: Secondary | ICD-10-CM | POA: Diagnosis not present

## 2019-07-19 LAB — ANA: Anti Nuclear Antibody (ANA): NEGATIVE

## 2019-07-19 LAB — MITOCHONDRIAL ANTIBODIES: Mitochondrial M2 Ab, IgG: <20 Units (ref 0.0–20.0)

## 2019-08-02 DIAGNOSIS — H524 Presbyopia: Secondary | ICD-10-CM | POA: Diagnosis not present

## 2019-08-09 ENCOUNTER — Other Ambulatory Visit: Payer: Self-pay | Admitting: Family Medicine

## 2019-08-09 DIAGNOSIS — J302 Other seasonal allergic rhinitis: Secondary | ICD-10-CM

## 2019-08-09 MED ORDER — MONTELUKAST SODIUM 10 MG PO TABS: 10.0000 mg | ORAL_TABLET | Freq: Every day | ORAL | 3 refills | Status: DC

## 2019-08-09 NOTE — Telephone Encounter (Signed)
[facility] faxed refill request for the following medications:   montelukast (SINGULAIR) 10 MG tablet   Please advise.  

## 2019-09-21 ENCOUNTER — Encounter: Payer: Self-pay | Admitting: Obstetrics and Gynecology

## 2019-09-21 DIAGNOSIS — Z1231 Encounter for screening mammogram for malignant neoplasm of breast: Secondary | ICD-10-CM | POA: Diagnosis not present

## 2019-09-22 ENCOUNTER — Encounter: Payer: Self-pay | Admitting: Obstetrics and Gynecology

## 2019-12-06 ENCOUNTER — Encounter: Payer: Self-pay | Admitting: Family Medicine

## 2019-12-06 ENCOUNTER — Other Ambulatory Visit: Payer: Self-pay

## 2019-12-06 ENCOUNTER — Ambulatory Visit: Payer: Federal, State, Local not specified - PPO | Admitting: Family Medicine

## 2019-12-06 VITALS — BP 110/69 | HR 76 | Temp 98.4°F | Resp 16 | Ht 63.0 in | Wt 149.4 lb

## 2019-12-06 DIAGNOSIS — L918 Other hypertrophic disorders of the skin: Secondary | ICD-10-CM | POA: Insufficient documentation

## 2019-12-06 DIAGNOSIS — Z23 Encounter for immunization: Secondary | ICD-10-CM | POA: Diagnosis not present

## 2019-12-06 DIAGNOSIS — R0982 Postnasal drip: Secondary | ICD-10-CM | POA: Insufficient documentation

## 2019-12-06 DIAGNOSIS — I10 Essential (primary) hypertension: Secondary | ICD-10-CM

## 2019-12-06 DIAGNOSIS — R7303 Prediabetes: Secondary | ICD-10-CM | POA: Diagnosis not present

## 2019-12-06 DIAGNOSIS — D696 Thrombocytopenia, unspecified: Secondary | ICD-10-CM

## 2019-12-06 DIAGNOSIS — E782 Mixed hyperlipidemia: Secondary | ICD-10-CM

## 2019-12-06 DIAGNOSIS — E663 Overweight: Secondary | ICD-10-CM

## 2019-12-06 NOTE — Progress Notes (Deleted)
° ° ° °  Established patient visit   Patient: Alyssa Thornton   DOB: Aug 09, 1955   64 y.o. Female  MRN: [id number] Visit Date: 12/06/2019  Today's healthcare provider: Lavon Paganini, MD   Chief Complaint  Patient presents with   Hypertension   Subjective    HPI  *** Hypertension, follow-up  BP Readings from Last 3 Encounters:  12/06/19 110/69  06/29/19 110/70  05/26/19 138/81   Wt Readings from Last 3 Encounters:  12/06/19 149 lb 6.4 oz (67.8 kg)  06/29/19 145 lb (65.8 kg)  05/26/19 157 lb (71.2 kg)     She was last seen for hypertension 6 months ago.  BP at that visit was 110/70. Management since that visit includes continuing current medications.  She reports {excellent/good/fair/poor:19665} compliance with treatment. She {is/is not:9024} having side effects. {document side effects if present:1} She is following a {diet:21022986} diet. She {is/is not:9024} exercising. She {does/does not:200015} smoke.  Use of agents associated with hypertension: {bp agents assoc with hypertension:511::"none"}.   Outside blood pressures are {***enter patient reported home BP readings, or 'not being checked':1}. Symptoms: {Yes/No:20286} chest pain {Yes/No:20286} chest pressure  {Yes/No:20286} palpitations {Yes/No:20286} syncope  {Yes/No:20286} dyspnea {Yes/No:20286} orthopnea  {Yes/No:20286} paroxysmal nocturnal dyspnea {Yes/No:20286} lower extremity edema   Pertinent labs: Lab Results  Component Value Date   CHOL 180 05/26/2019   HDL 71 05/26/2019   LDLCALC 91 05/26/2019   TRIG 102 05/26/2019   CHOLHDL 2.5 05/26/2019   Lab Results  Component Value Date   NA 142 05/26/2019   K 4.0 05/26/2019   CREATININE 0.63 05/26/2019   GFRNONAA 94 05/26/2019   GFRAA 109 05/26/2019   GLUCOSE 103 (H) 05/26/2019     The 10-year ASCVD risk score Mikey Bussing DC Jr., et al., 2013) is: 5.3%    ---------------------------------------------------------------------------------------------------   {Show patient history (optional):23778::" "}   Medications: Outpatient Medications Prior to Visit  Medication Sig   Cholecalciferol (VITAMIN D-3 PO) Take 4,000 Int'l Units by mouth once. Once daily   conjugated estrogens (PREMARIN) vaginal cream 1 applicatorful vaginally 1-2 times weekly   fluticasone (FLONASE) 50 MCG/ACT nasal spray Place 1 spray into both nostrils daily as needed.   montelukast (SINGULAIR) 10 MG tablet Take 1 tablet (10 mg total) by mouth daily.   Multiple Vitamin (MULTIVITAMIN) tablet Take 1 tablet by mouth daily.   quinapril-hydrochlorothiazide (ACCURETIC) 10-12.5 MG tablet Take 1 tablet by mouth daily.   vitamin C (ASCORBIC ACID) 500 MG tablet Take 500 mg by mouth daily.   [DISCONTINUED] metroNIDAZOLE (METROGEL) 1 % gel Apply 1 application topically daily.   [DISCONTINUED] vitamin E (VITAMIN E) 400 UNIT capsule Take 400 Units by mouth daily.   No facility-administered medications prior to visit.    Review of Systems  {Heme   Chem   Endocrine   Serology   Results Review (optional):23779::" "}  Objective    BP 110/69 (BP Location: Left Arm, Patient Position: Sitting, Cuff Size: Normal)    Pulse 76    Temp 98.4 F (36.9 C) (Oral)    Resp 16    Ht 5\' 3"  (1.6 m)    Wt 149 lb 6.4 oz (67.8 kg)    BMI 26.47 kg/m  {Show previous vital signs (optional):23777::" "}  Physical Exam  ***  No results found for any visits on 12/06/19.  Assessment & Plan     ***  No follow-ups on file.      Rodrigo Ran, MS3

## 2019-12-06 NOTE — Assessment & Plan Note (Addendum)
Not on any statin Reviewed last FLP and CMP Recheck annually

## 2019-12-06 NOTE — Assessment & Plan Note (Signed)
Mild, asymptomatic Recheck CBC

## 2019-12-06 NOTE — Assessment & Plan Note (Signed)
Patient was given informed consent,. Appropriate time out was taken. Area prepped and draped in usual sterile fashion. Ethyl chloride spray used for topical anesthetic.  2 skin tags in R axilla, 1 in L axilla, and 1 on right neckline grasped and clipped at base with scissors.  The patient tolerated the procedure well. There were no complications. Post procedure instructions were given.

## 2019-12-06 NOTE — Assessment & Plan Note (Addendum)
Recheck A1c  Continue working on healthy lifestyle changes

## 2019-12-06 NOTE — Assessment & Plan Note (Signed)
Discussed importance of healthy weight management Discussed diet and exercise  

## 2019-12-06 NOTE — Patient Instructions (Signed)
Hypertension, Adult High blood pressure (hypertension) is when the force of blood pumping through the arteries is too strong. The arteries are the blood vessels that carry blood from the heart throughout the body. Hypertension forces the heart to work harder to pump blood and may cause arteries to become narrow or stiff. Untreated or uncontrolled hypertension can cause a heart attack, heart failure, a stroke, kidney disease, and other problems. A blood pressure reading consists of a higher number over a lower number. Ideally, your blood pressure should be below 120/80. The first ("top") number is called the systolic pressure. It is a measure of the pressure in your arteries as your heart beats. The second ("bottom") number is called the diastolic pressure. It is a measure of the pressure in your arteries as the heart relaxes. What are the causes? The exact cause of this condition is not known. There are some conditions that result in or are related to high blood pressure. What increases the risk? Some risk factors for high blood pressure are under your control. The following factors may make you more likely to develop this condition:  Smoking.  Having type 2 diabetes mellitus, high cholesterol, or both.  Not getting enough exercise or physical activity.  Being overweight.  Having too much fat, sugar, calories, or salt (sodium) in your diet.  Drinking too much alcohol. Some risk factors for high blood pressure may be difficult or impossible to change. Some of these factors include:  Having chronic kidney disease.  Having a family history of high blood pressure.  Age. Risk increases with age.  Race. You may be at higher risk if you are African American.  Gender. Men are at higher risk than women before age 45. After age 65, women are at higher risk than men.  Having obstructive sleep apnea.  Stress. What are the signs or symptoms? High blood pressure may not cause symptoms. Very high  blood pressure (hypertensive crisis) may cause:  Headache.  Anxiety.  Shortness of breath.  Nosebleed.  Nausea and vomiting.  Vision changes.  Severe chest pain.  Seizures. How is this diagnosed? This condition is diagnosed by measuring your blood pressure while you are seated, with your arm resting on a flat surface, your legs uncrossed, and your feet flat on the floor. The cuff of the blood pressure monitor will be placed directly against the skin of your upper arm at the level of your heart. It should be measured at least twice using the same arm. Certain conditions can cause a difference in blood pressure between your right and left arms. Certain factors can cause blood pressure readings to be lower or higher than normal for a short period of time:  When your blood pressure is higher when you are in a health care provider's office than when you are at home, this is called white coat hypertension. Most people with this condition do not need medicines.  When your blood pressure is higher at home than when you are in a health care provider's office, this is called masked hypertension. Most people with this condition may need medicines to control blood pressure. If you have a high blood pressure reading during one visit or you have normal blood pressure with other risk factors, you may be asked to:  Return on a different day to have your blood pressure checked again.  Monitor your blood pressure at home for 1 week or longer. If you are diagnosed with hypertension, you may have other blood or   imaging tests to help your health care provider understand your overall risk for other conditions. How is this treated? This condition is treated by making healthy lifestyle changes, such as eating healthy foods, exercising more, and reducing your alcohol intake. Your health care provider may prescribe medicine if lifestyle changes are not enough to get your blood pressure under control, and  if:  Your systolic blood pressure is above 130.  Your diastolic blood pressure is above 80. Your personal target blood pressure may vary depending on your medical conditions, your age, and other factors. Follow these instructions at home: Eating and drinking   Eat a diet that is high in fiber and potassium, and low in sodium, added sugar, and fat. An example eating plan is called the DASH (Dietary Approaches to Stop Hypertension) diet. To eat this way: ? Eat plenty of fresh fruits and vegetables. Try to fill one half of your plate at each meal with fruits and vegetables. ? Eat whole grains, such as whole-wheat pasta, brown rice, or whole-grain bread. Fill about one fourth of your plate with whole grains. ? Eat or drink low-fat dairy products, such as skim milk or low-fat yogurt. ? Avoid fatty cuts of meat, processed or cured meats, and poultry with skin. Fill about one fourth of your plate with lean proteins, such as fish, chicken without skin, beans, eggs, or tofu. ? Avoid pre-made and processed foods. These tend to be higher in sodium, added sugar, and fat.  Reduce your daily sodium intake. Most people with hypertension should eat less than 1,500 mg of sodium a day.  Do not drink alcohol if: ? Your health care provider tells you not to drink. ? You are pregnant, may be pregnant, or are planning to become pregnant.  If you drink alcohol: ? Limit how much you use to:  0-1 drink a day for women.  0-2 drinks a day for men. ? Be aware of how much alcohol is in your drink. In the [place] one drink equals one 12 oz bottle of beer (355 mL), one 5 oz glass of wine (148 mL), or one 1 oz glass of hard liquor (44 mL). Lifestyle   Work with your health care provider to maintain a healthy body weight or to lose weight. Ask what an ideal weight is for you.  Get at least 30 minutes of exercise most days of the week. Activities may include walking, swimming, or biking.  Include exercise to  strengthen your muscles (resistance exercise), such as Pilates or lifting weights, as part of your weekly exercise routine. Try to do these types of exercises for 30 minutes at least 3 days a week.  Do not use any products that contain nicotine or tobacco, such as cigarettes, e-cigarettes, and chewing tobacco. If you need help quitting, ask your health care provider.  Monitor your blood pressure at home as told by your health care provider.  Keep all follow-up visits as told by your health care provider. This is important. Medicines  Take over-the-counter and prescription medicines only as told by your health care provider. Follow directions carefully. Blood pressure medicines must be taken as prescribed.  Do not skip doses of blood pressure medicine. Doing this puts you at risk for problems and can make the medicine less effective.  Ask your health care provider about side effects or reactions to medicines that you should watch for. Contact a health care provider if you:  Think you are having a reaction to a medicine you   are taking.  Have headaches that keep coming back (recurring).  Feel dizzy.  Have swelling in your ankles.  Have trouble with your vision. Get help right away if you:  Develop a severe headache or confusion.  Have unusual weakness or numbness.  Feel faint.  Have severe pain in your chest or abdomen.  Vomit repeatedly.  Have trouble breathing. Summary  Hypertension is when the force of blood pumping through your arteries is too strong. If this condition is not controlled, it may put you at risk for serious complications.  Your personal target blood pressure may vary depending on your medical conditions, your age, and other factors. For most people, a normal blood pressure is less than 120/80.  Hypertension is treated with lifestyle changes, medicines, or a combination of both. Lifestyle changes include losing weight, eating a healthy, low-sodium diet,  exercising more, and limiting alcohol. This information is not intended to replace advice given to you by your health care provider. Make sure you discuss any questions you have with your health care provider. Document Revised: 12/03/2017 Document Reviewed: 12/03/2017 Elsevier Patient Education  [place].  Pneumococcal Conjugate Vaccine (PCV13): What You Need to Know 1. Why get vaccinated? Pneumococcal conjugate vaccine (PCV13) can prevent pneumococcal disease. Pneumococcal disease refers to any illness caused by pneumococcal bacteria. These bacteria can cause many types of illnesses, including pneumonia, which is an infection of the lungs. Pneumococcal bacteria are one of the most common causes of pneumonia. Besides pneumonia, pneumococcal bacteria can also cause:  Ear infections  Sinus infections  Meningitis (infection of the tissue covering the brain and spinal cord)  Bacteremia (bloodstream infection) Anyone can get pneumococcal disease, but children under 66 years of age, people with certain medical conditions, adults 48 years or older, and cigarette smokers are at the highest risk. Most pneumococcal infections are mild. However, some can result in long-term problems, such as brain damage or hearing loss. Meningitis, bacteremia, and pneumonia caused by pneumococcal disease can be fatal. 2. PCV13 PCV13 protects against 13 types of bacteria that cause pneumococcal disease. Infants and young children usually need 4 doses of pneumococcal conjugate vaccine, at 2, 4, 6, and 33-35 months of age. In some cases, a child might need fewer than 4 doses to complete PCV13 vaccination. A dose of PCV23 vaccine is also recommended for anyone 2 years or older with certain medical conditions if they did not already receive PCV13. This vaccine may be given to adults 9 years or older based on discussions between the patient and health care provider. 3. Talk with your health care provider Tell  your vaccine provider if the person getting the vaccine:  Has had an allergic reaction after a previous dose of PCV13, to an earlier pneumococcal conjugate vaccine known as PCV7, or to any vaccine containing diphtheria toxoid (for example, DTaP), or has any severe, life-threatening allergies.  In some cases, your health care provider may decide to postpone PCV13 vaccination to a future visit. People with minor illnesses, such as a cold, may be vaccinated. People who are moderately or severely ill should usually wait until they recover before getting PCV13. Your health care provider can give you more information. 4. Risks of a vaccine reaction  Redness, swelling, pain, or tenderness where the shot is given, and fever, loss of appetite, fussiness (irritability), feeling tired, headache, and chills can happen after PCV13. Young children may be at increased risk for seizures caused by fever after PCV13 if it is administered at the  same time as inactivated influenza vaccine. Ask your health care provider for more information. People sometimes faint after medical procedures, including vaccination. Tell your provider if you feel dizzy or have vision changes or ringing in the ears. As with any medicine, there is a very remote chance of a vaccine causing a severe allergic reaction, other serious injury, or death. 5. What if there is a serious problem? An allergic reaction could occur after the vaccinated person leaves the clinic. If you see signs of a severe allergic reaction (hives, swelling of the face and throat, difficulty breathing, a fast heartbeat, dizziness, or weakness), call 9-1-1 and get the person to the nearest hospital. For other signs that concern you, call your health care provider. Adverse reactions should be reported to the Vaccine Adverse Event Reporting System (VAERS). Your health care provider will usually file this report, or you can do it yourself. Visit the VAERS website at  www.vaers.SamedayNews.es or call 910-811-9417. VAERS is only for reporting reactions, and VAERS staff do not give medical advice. 6. The National Vaccine Injury Compensation Program The [employer] Vaccine Injury Compensation Program (VICP) is a federal program that was created to compensate people who may have been injured by certain vaccines. Visit the VICP website at GoldCloset.com.ee or call (763)636-0221 to learn about the program and about filing a claim. There is a time limit to file a claim for compensation. 7. How can I learn more?  Ask your health care provider.  Call your local or state health department.  Contact the Centers for Disease Control and Prevention (CDC): ? Call (208)312-5129 (1-800-CDC-INFO) or ? Visit CDC's website at http://hunter.com/ Vaccine Information Statement PCV13 Vaccine (02/04/2018) This information is not intended to replace advice given to you by your health care provider. Make sure you discuss any questions you have with your health care provider. Document Revised: 07/14/2018 Document Reviewed: 11/04/2017 Elsevier Patient Education  [place].

## 2019-12-06 NOTE — Progress Notes (Signed)
Established patient visit   Patient: Alyssa Thornton   DOB: 09-10-1955   64 y.o. Female  MRN: [id number] Visit Date: 12/06/2019  Today's healthcare provider: Lavon Paganini, MD   I,Sulibeya S Dimas,acting as a scribe for Lavon Paganini, MD.,have documented all relevant documentation on the behalf of Lavon Paganini, MD,as directed by  Lavon Paganini, MD while in the presence of Lavon Paganini, MD.  Chief Complaint  Patient presents with  . Hypertension   Subjective    HPI  Hypertension, follow-up  BP Readings from Last 3 Encounters:  12/06/19 110/69  06/29/19 110/70  05/26/19 138/81   Wt Readings from Last 3 Encounters:  12/06/19 149 lb 6.4 oz (67.8 kg)  06/29/19 145 lb (65.8 kg)  05/26/19 157 lb (71.2 kg)     She was last seen for hypertension 6 months ago.  BP at that visit was 138/81. Management since that visit includes no changes.  She reports excellent compliance with treatment. She is not having side effects.  She is following a Regular diet. She is not exercising. She does not smoke.  Use of agents associated with hypertension: none.   Outside blood pressures are not being checked. Symptoms: No chest pain No chest pressure  No palpitations No syncope  No dyspnea No orthopnea  No paroxysmal nocturnal dyspnea No lower extremity edema   Pertinent labs: Lab Results  Component Value Date   CHOL 180 05/26/2019   HDL 71 05/26/2019   LDLCALC 91 05/26/2019   TRIG 102 05/26/2019   CHOLHDL 2.5 05/26/2019   Lab Results  Component Value Date   NA 142 05/26/2019   K 4.0 05/26/2019   CREATININE 0.63 05/26/2019   GFRNONAA 94 05/26/2019   GFRAA 109 05/26/2019   GLUCOSE 103 (H) 05/26/2019     The 10-year ASCVD risk score Mikey Bussing DC Jr., et al., 2013) is: 5.3%   ---------------------------------------------------------------------------------------------------  Patient C/O skin tags on underarm and would like to have it removed. Patient also  C/O feeling like she has a "hair stuck" in her throat.  Patient requesting to have some work notes done.  Patient Active Problem List   Diagnosis Date Noted  . Postnasal drip 12/06/2019  . Skin tags, multiple acquired 12/06/2019  . Thrombocytopenia ([place]) 05/26/2019  . Family history of ovarian cancer 06/16/2018  . Prediabetes 05/05/2018  . Overweight 05/05/2018  . Abnormal mammogram of left breast 02/17/2017  . Bilateral carpal tunnel syndrome 12/17/2016  . Allergic rhinitis 03/31/2015  . Hyperlipidemia 03/31/2015  . Accumulation of fluid in tissues 03/31/2015  . Fatty infiltration of liver 03/31/2015  . Essential hypertension 03/31/2015  . Headache, migraine 03/31/2015  . Plantar fasciitis 03/31/2015  . Atypical hyperplasia of left breast 07/10/2013   Social History   Tobacco Use  . Smoking status: Never Smoker  . Smokeless tobacco: Never Used  Vaping Use  . Vaping Use: Never used  Substance Use Topics  . Alcohol use: No  . Drug use: No   Allergies  Allergen Reactions  . Diclofenac Sodium Diarrhea  . Tramadol Diarrhea  . Levaquin [Levofloxacin] Nausea And Vomiting       Medications: Outpatient Medications Prior to Visit  Medication Sig  . Cholecalciferol (VITAMIN D-3 PO) Take 4,000 Int'l Units by mouth once. Once daily  . conjugated estrogens (PREMARIN) vaginal cream 1 applicatorful vaginally 1-2 times weekly  . fluticasone (FLONASE) 50 MCG/ACT nasal spray Place 1 spray into both nostrils daily as needed.  . montelukast (SINGULAIR) 10 MG  tablet Take 1 tablet (10 mg total) by mouth daily.  . Multiple Vitamin (MULTIVITAMIN) tablet Take 1 tablet by mouth daily.  . quinapril-hydrochlorothiazide (ACCURETIC) 10-12.5 MG tablet Take 1 tablet by mouth daily.  . vitamin C (ASCORBIC ACID) 500 MG tablet Take 500 mg by mouth daily.  . [DISCONTINUED] metroNIDAZOLE (METROGEL) 1 % gel Apply 1 application topically daily.  . [DISCONTINUED] vitamin E (VITAMIN E) 400 UNIT capsule  Take 400 Units by mouth daily.   No facility-administered medications prior to visit.    Review of Systems  Constitutional: Negative.   HENT: Positive for postnasal drip.   Respiratory: Negative.   Cardiovascular: Negative.     Last CBC Lab Results  Component Value Date   WBC 4.5 05/26/2019   HGB 14.3 05/26/2019   HCT 39.8 05/26/2019   MCV 88 05/26/2019   MCH 31.8 05/26/2019   RDW 12.1 05/26/2019   PLT 112 (L) 48/88/9169   Last metabolic panel Lab Results  Component Value Date   GLUCOSE 103 (H) 05/26/2019   NA 142 05/26/2019   K 4.0 05/26/2019   CL 103 05/26/2019   CO2 24 05/26/2019   BUN 10 05/26/2019   CREATININE 0.63 05/26/2019   GFRNONAA 94 05/26/2019   GFRAA 109 05/26/2019   CALCIUM 9.2 05/26/2019   PROT 6.6 05/26/2019   ALBUMIN 4.4 05/26/2019   LABGLOB 2.2 05/26/2019   AGRATIO 2.0 05/26/2019   BILITOT 0.8 05/26/2019   ALKPHOS 153 (H) 05/26/2019   AST 30 05/26/2019   ALT 31 05/26/2019      Objective    BP 110/69 (BP Location: Left Arm, Patient Position: Sitting, Cuff Size: Normal)   Pulse 76   Temp 98.4 F (36.9 C) (Oral)   Resp 16   Ht 5\' 3"  (1.6 m)   Wt 149 lb 6.4 oz (67.8 kg)   BMI 26.47 kg/m  BP Readings from Last 3 Encounters:  12/06/19 110/69  06/29/19 110/70  05/26/19 138/81   Wt Readings from Last 3 Encounters:  12/06/19 149 lb 6.4 oz (67.8 kg)  06/29/19 145 lb (65.8 kg)  05/26/19 157 lb (71.2 kg)      Physical Exam Vitals reviewed.  Constitutional:      General: She is not in acute distress.    Appearance: Normal appearance. She is well-developed. She is not diaphoretic.  HENT:     Head: Normocephalic and atraumatic.  Eyes:     General: No scleral icterus.    Conjunctiva/sclera: Conjunctivae normal.  Neck:     Thyroid: No thyromegaly.  Cardiovascular:     Rate and Rhythm: Normal rate and regular rhythm.     Pulses: Normal pulses.     Heart sounds: Normal heart sounds. No murmur heard.   Pulmonary:     Effort:  Pulmonary effort is normal. No respiratory distress.     Breath sounds: Normal breath sounds. No wheezing, rhonchi or rales.  Musculoskeletal:     Cervical back: Neck supple.     Right lower leg: No edema.     Left lower leg: No edema.  Lymphadenopathy:     Cervical: No cervical adenopathy.  Skin:    General: Skin is warm and dry.     Findings: No rash.  Neurological:     Mental Status: She is alert and oriented to person, place, and time. Mental status is at baseline.  Psychiatric:        Mood and Affect: Mood normal.      No results found  for any visits on 12/06/19.  Assessment & Plan     Problem List Items Addressed This Visit      Cardiovascular and Mediastinum   Essential hypertension - Primary    Well controlled Continue current medications Recheck metabolic panel F/u in 6 months       Relevant Orders   Basic Metabolic Panel (BMET)     Musculoskeletal and Integument   Skin tags, multiple acquired    Patient was given informed consent,. Appropriate time out was taken. Area prepped and draped in usual sterile fashion. Ethyl chloride spray used for topical anesthetic.  2 skin tags in R axilla, 1 in L axilla, and 1 on right neckline grasped and clipped at base with scissors.  The patient tolerated the procedure well. There were no complications. Post procedure instructions were given.         Other   Hyperlipidemia    Not on any statin Reviewed last FLP and CMP Recheck annually      Prediabetes    Recheck A1c  Continue working on healthy lifestyle changes      Relevant Orders   Hemoglobin A1c   Overweight    Discussed importance of healthy weight management Discussed diet and exercise       Thrombocytopenia ([facility])    Mild, asymptomatic Recheck CBC      Relevant Orders   CBC with Differential/Platelet   Postnasal drip    Related to allergic rhinitis Continue flonase and singulair Add antihistamine OTC       Other Visit Diagnoses    Need for  Tdap vaccination       Relevant Orders   Tdap vaccine greater than or equal to 7yo IM (Completed)   Need for 23-polyvalent pneumococcal polysaccharide vaccine       Relevant Orders   Pneumococcal polysaccharide vaccine 23-valent greater than or equal to 2yo subcutaneous/IM (Completed)       Return in about 6 months (around 06/05/2020) for chronic disease f/u.      I, Lavon Paganini, MD, have reviewed all documentation for this visit. The documentation on 12/06/19 for the exam, diagnosis, procedures, and orders are all accurate and complete.   Preslyn Warr, Dionne Bucy, MD, MPH [facility]

## 2019-12-06 NOTE — Assessment & Plan Note (Signed)
Related to allergic rhinitis Continue flonase and singulair Add antihistamine OTC

## 2019-12-06 NOTE — Assessment & Plan Note (Signed)
Well controlled Continue current medications Recheck metabolic panel F/u in 6 months  

## 2019-12-07 LAB — CBC WITH DIFFERENTIAL/PLATELET
Basophils Absolute: 0 10*3/uL (ref 0.0–0.2)
Basos: 1 %
EOS (ABSOLUTE): 0.1 10*3/uL (ref 0.0–0.4)
Eos: 2 %
Hematocrit: 43.4 % (ref 34.0–46.6)
Hemoglobin: 14.2 g/dL (ref 11.1–15.9)
Immature Grans (Abs): 0 10*3/uL (ref 0.0–0.1)
Immature Granulocytes: 0 %
Lymphocytes Absolute: 1.4 10*3/uL (ref 0.7–3.1)
Lymphs: 28 %
MCH: 30.3 pg (ref 26.6–33.0)
MCHC: 32.7 g/dL (ref 31.5–35.7)
MCV: 93 fL (ref 79–97)
Monocytes Absolute: 0.4 10*3/uL (ref 0.1–0.9)
Monocytes: 7 %
Neutrophils Absolute: 3.1 10*3/uL (ref 1.4–7.0)
Neutrophils: 62 %
Platelets: 129 10*3/uL — ABNORMAL LOW (ref 150–450)
RBC: 4.69 x10E6/uL (ref 3.77–5.28)
RDW: 12.4 % (ref 11.7–15.4)
WBC: 4.9 10*3/uL (ref 3.4–10.8)

## 2019-12-07 LAB — BASIC METABOLIC PANEL
BUN/Creatinine Ratio: 26 (ref 12–28)
BUN: 17 mg/dL (ref 8–27)
CO2: 23 mmol/L (ref 20–29)
Calcium: 9 mg/dL (ref 8.7–10.3)
Chloride: 102 mmol/L (ref 96–106)
Creatinine, Ser: 0.66 mg/dL (ref 0.57–1.00)
GFR calc Af Amer: 106 mL/min/{1.73_m2} (ref >59)
GFR calc non Af Amer: 92 mL/min/{1.73_m2} (ref >59)
Glucose: 115 mg/dL — ABNORMAL HIGH (ref 65–99)
Potassium: 4 mmol/L (ref 3.5–5.2)
Sodium: 140 mmol/L (ref 134–144)

## 2019-12-07 LAB — HEMOGLOBIN A1C
Est. average glucose Bld gHb Est-mCnc: 117 mg/dL
Hgb A1c MFr Bld: 5.7 % — ABNORMAL HIGH (ref 4.8–5.6)

## 2019-12-08 ENCOUNTER — Telehealth: Payer: Self-pay

## 2019-12-08 NOTE — Telephone Encounter (Signed)
Visible to patient:  Yes (seen

## 2019-12-08 NOTE — Telephone Encounter (Signed)
-----   Message from Virginia Crews, MD sent at 12/08/2019  8:07 AM EDT ----- Normal/stable labs

## 2020-04-24 ENCOUNTER — Encounter: Payer: Self-pay | Admitting: Family Medicine

## 2020-04-25 ENCOUNTER — Telehealth: Payer: Self-pay | Admitting: Family Medicine

## 2020-04-25 DIAGNOSIS — I10 Essential (primary) hypertension: Secondary | ICD-10-CM

## 2020-04-25 MED ORDER — QUINAPRIL-HYDROCHLOROTHIAZIDE 10-12.5 MG PO TABS: 1.0000 | ORAL_TABLET | Freq: Every day | ORAL | 1 refills | Status: DC

## 2020-04-25 NOTE — Telephone Encounter (Signed)
[facility] faxed refill request for the following medications:  quinapril-hydrochlorothiazide (ACCURETIC) 10-12.5 MG tablet  Last Rx: 04/26/2019 LOV: 12/06/2019 NOV: 05/22/2020 Please advise. Thanks TNP

## 2020-05-05 ENCOUNTER — Other Ambulatory Visit: Payer: Federal, State, Local not specified - PPO

## 2020-05-05 ENCOUNTER — Other Ambulatory Visit: Payer: Self-pay

## 2020-05-06 ENCOUNTER — Other Ambulatory Visit: Payer: Federal, State, Local not specified - PPO

## 2020-05-06 DIAGNOSIS — Z20822 Contact with and (suspected) exposure to covid-19: Secondary | ICD-10-CM | POA: Diagnosis not present

## 2020-05-15 ENCOUNTER — Ambulatory Visit: Payer: Federal, State, Local not specified - PPO | Admitting: Family Medicine

## 2020-05-22 ENCOUNTER — Other Ambulatory Visit: Payer: Self-pay

## 2020-05-22 ENCOUNTER — Ambulatory Visit: Payer: Federal, State, Local not specified - PPO | Admitting: Family Medicine

## 2020-05-22 ENCOUNTER — Encounter: Payer: Self-pay | Admitting: Family Medicine

## 2020-05-22 VITALS — BP 127/82 | HR 81 | Temp 98.6°F | Wt 151.5 lb

## 2020-05-22 DIAGNOSIS — E782 Mixed hyperlipidemia: Secondary | ICD-10-CM

## 2020-05-22 DIAGNOSIS — R7303 Prediabetes: Secondary | ICD-10-CM

## 2020-05-22 DIAGNOSIS — E663 Overweight: Secondary | ICD-10-CM | POA: Diagnosis not present

## 2020-05-22 DIAGNOSIS — D696 Thrombocytopenia, unspecified: Secondary | ICD-10-CM

## 2020-05-22 DIAGNOSIS — I10 Essential (primary) hypertension: Secondary | ICD-10-CM | POA: Diagnosis not present

## 2020-05-22 DIAGNOSIS — K760 Fatty (change of) liver, not elsewhere classified: Secondary | ICD-10-CM

## 2020-05-22 NOTE — Assessment & Plan Note (Signed)
Well controlled Continue current medications Recheck metabolic panel F/u in 6 months  

## 2020-05-22 NOTE — Assessment & Plan Note (Signed)
Discussed importance of healthy weight management Discussed diet and exercise  

## 2020-05-22 NOTE — Assessment & Plan Note (Signed)
Monitor CMP

## 2020-05-22 NOTE — Progress Notes (Signed)
Established patient visit   Patient: Alyssa Thornton   DOB: 03/08/1956   65 y.o. Female  MRN: [id number] Visit Date: 05/22/2020  Today's healthcare provider: Lavon Paganini, MD   Chief Complaint  Patient presents with  . Hypertension  I,Deckard Stuber,acting as a scribe for Lavon Paganini, MD.,have documented all relevant documentation on the behalf of Lavon Paganini, MD,as directed by  Lavon Paganini, MD while in the presence of Lavon Paganini, MD.  Subjective    HPI  Hypertension, follow-up  BP Readings from Last 3 Encounters:  05/22/20 127/82  12/06/19 110/69  06/29/19 110/70   Wt Readings from Last 3 Encounters:  05/22/20 151 lb 8 oz (68.7 kg)  12/06/19 149 lb 6.4 oz (67.8 kg)  06/29/19 145 lb (65.8 kg)     She was last seen for hypertension 6 months ago.  BP at that visit was 110/69. Management since that visit includes continue current medication.  She reports good compliance with treatment. She is not having side effects.  She is following a Regular diet. She is exercising. She does not smoke.  Use of agents associated with hypertension: none.   Outside blood pressures are arranging around 120's-130's/70's-80's. Symptoms: No chest pain No chest pressure  No palpitations No syncope  No dyspnea No orthopnea  No paroxysmal nocturnal dyspnea No lower extremity edema   Pertinent labs: Lab Results  Component Value Date   CHOL 180 05/26/2019   HDL 71 05/26/2019   LDLCALC 91 05/26/2019   TRIG 102 05/26/2019   CHOLHDL 2.5 05/26/2019   Lab Results  Component Value Date   NA 140 12/06/2019   K 4.0 12/06/2019   CREATININE 0.66 12/06/2019   GFRNONAA 92 12/06/2019   GFRAA 106 12/06/2019   GLUCOSE 115 (H) 12/06/2019     The 10-year ASCVD risk score Mikey Bussing DC Jr., et al., 2013) is: 7.1%   ---------------------------------------------------------------------------------------------------      Medications: Outpatient Medications  Prior to Visit  Medication Sig  . Cholecalciferol (VITAMIN D-3 PO) Take 4,000 Int'l Units by mouth once. Once daily  . fluticasone (FLONASE) 50 MCG/ACT nasal spray Place 1 spray into both nostrils daily as needed.  . montelukast (SINGULAIR) 10 MG tablet Take 1 tablet (10 mg total) by mouth daily.  . Multiple Vitamin (MULTIVITAMIN) tablet Take 1 tablet by mouth daily.  . quinapril-hydrochlorothiazide (ACCURETIC) 10-12.5 MG tablet Take 1 tablet by mouth daily.  . vitamin C (ASCORBIC ACID) 500 MG tablet Take 500 mg by mouth daily.  . [DISCONTINUED] conjugated estrogens (PREMARIN) vaginal cream 1 applicatorful vaginally 1-2 times weekly   No facility-administered medications prior to visit.    Review of Systems  Constitutional: Negative.   Respiratory: Negative.   Cardiovascular: Negative.   Hematological: Negative.        Objective    BP 127/82 (BP Location: Left Arm, Patient Position: Sitting, Cuff Size: Large)   Pulse 81   Temp 98.6 F (37 C) (Oral)   Wt 151 lb 8 oz (68.7 kg)   SpO2 99%   BMI 26.84 kg/m     Physical Exam Vitals reviewed.  Constitutional:      General: She is not in acute distress.    Appearance: Normal appearance. She is well-developed. She is not diaphoretic.  HENT:     Head: Normocephalic and atraumatic.  Eyes:     General: No scleral icterus.    Conjunctiva/sclera: Conjunctivae normal.  Neck:     Thyroid: No thyromegaly.  Cardiovascular:  Rate and Rhythm: Normal rate and regular rhythm.     Pulses: Normal pulses.     Heart sounds: Normal heart sounds. No murmur heard.   Pulmonary:     Effort: Pulmonary effort is normal. No respiratory distress.     Breath sounds: Normal breath sounds. No wheezing, rhonchi or rales.  Musculoskeletal:     Cervical back: Neck supple.     Right lower leg: No edema.     Left lower leg: No edema.  Lymphadenopathy:     Cervical: No cervical adenopathy.  Skin:    General: Skin is warm and dry.     Findings:  No rash.  Neurological:     Mental Status: She is alert and oriented to person, place, and time. Mental status is at baseline.  Psychiatric:        Mood and Affect: Mood normal.        Behavior: Behavior normal.       No results found for any visits on 05/22/20.  Assessment & Plan     Problem List Items Addressed This Visit      Cardiovascular and Mediastinum   Essential hypertension - Primary    Well controlled Continue current medications Recheck metabolic panel F/u in 6 months       Relevant Orders   Comprehensive metabolic panel   Lipid panel     Digestive   Fatty infiltration of liver    Monitor CMP      Relevant Orders   Comprehensive metabolic panel     Other   Hyperlipidemia    Reviewed last lipid panel Not currently on a statin Recheck FLP and CMP Discussed diet and exercise       Relevant Orders   Comprehensive metabolic panel   Lipid panel   Prediabetes    Recommend low carb diet Recheck A1c      Relevant Orders   Hemoglobin A1c   Overweight    Discussed importance of healthy weight management Discussed diet and exercise       Relevant Orders   Hemoglobin A1c   CBC   Comprehensive metabolic panel   Lipid panel   Thrombocytopenia (HCC)    Mild, asymptomatic Recheck CBC      Relevant Orders   CBC       Return in about 6 months (around 11/19/2020) for chronic disease f/u.      I, Lavon Paganini, MD, have reviewed all documentation for this visit. The documentation on 05/22/20 for the exam, diagnosis, procedures, and orders are all accurate and complete.   Antanasia Kaczynski, Dionne Bucy, MD, MPH [facility]

## 2020-05-22 NOTE — Assessment & Plan Note (Signed)
Reviewed last lipid panel Not currently on a statin Recheck FLP and CMP Discussed diet and exercise  

## 2020-05-22 NOTE — Assessment & Plan Note (Signed)
Mild, asymptomatic Recheck CBC

## 2020-05-22 NOTE — Assessment & Plan Note (Signed)
Recommend low carb diet °Recheck A1c  °

## 2020-05-23 LAB — COMPREHENSIVE METABOLIC PANEL
ALT: 27 IU/L (ref 0–32)
AST: 26 IU/L (ref 0–40)
Albumin/Globulin Ratio: 2 (ref 1.2–2.2)
Albumin: 4.5 g/dL (ref 3.8–4.8)
Alkaline Phosphatase: 161 IU/L — ABNORMAL HIGH (ref 44–121)
BUN/Creatinine Ratio: 22 (ref 12–28)
BUN: 15 mg/dL (ref 8–27)
Bilirubin Total: 0.8 mg/dL (ref 0.0–1.2)
CO2: 22 mmol/L (ref 20–29)
Calcium: 9 mg/dL (ref 8.7–10.3)
Chloride: 103 mmol/L (ref 96–106)
Creatinine, Ser: 0.67 mg/dL (ref 0.57–1.00)
GFR calc Af Amer: 106 mL/min/{1.73_m2} (ref >59)
GFR calc non Af Amer: 92 mL/min/{1.73_m2} (ref >59)
Globulin, Total: 2.2 g/dL (ref 1.5–4.5)
Glucose: 113 mg/dL — ABNORMAL HIGH (ref 65–99)
Potassium: 4.2 mmol/L (ref 3.5–5.2)
Sodium: 140 mmol/L (ref 134–144)
Total Protein: 6.7 g/dL (ref 6.0–8.5)

## 2020-05-23 LAB — CBC
Hematocrit: 44.7 % (ref 34.0–46.6)
Hemoglobin: 15.3 g/dL (ref 11.1–15.9)
MCH: 31.2 pg (ref 26.6–33.0)
MCHC: 34.2 g/dL (ref 31.5–35.7)
MCV: 91 fL (ref 79–97)
Platelets: 132 10*3/uL — ABNORMAL LOW (ref 150–450)
RBC: 4.91 x10E6/uL (ref 3.77–5.28)
RDW: 12.3 % (ref 11.7–15.4)
WBC: 4.6 10*3/uL (ref 3.4–10.8)

## 2020-05-23 LAB — LIPID PANEL
Chol/HDL Ratio: 2.7 ratio (ref 0.0–4.4)
Cholesterol, Total: 198 mg/dL (ref 100–199)
HDL: 73 mg/dL (ref >39)
LDL Chol Calc (NIH): 112 mg/dL — ABNORMAL HIGH (ref 0–99)
Triglycerides: 69 mg/dL (ref 0–149)
VLDL Cholesterol Cal: 13 mg/dL (ref 5–40)

## 2020-05-23 LAB — HEMOGLOBIN A1C
Est. average glucose Bld gHb Est-mCnc: 111 mg/dL
Hgb A1c MFr Bld: 5.5 % (ref 4.8–5.6)

## 2020-05-29 DIAGNOSIS — M778 Other enthesopathies, not elsewhere classified: Secondary | ICD-10-CM | POA: Diagnosis not present

## 2020-05-29 DIAGNOSIS — S8991XA Unspecified injury of right lower leg, initial encounter: Secondary | ICD-10-CM | POA: Diagnosis not present

## 2020-06-19 ENCOUNTER — Telehealth: Payer: Self-pay

## 2020-06-19 DIAGNOSIS — J302 Other seasonal allergic rhinitis: Secondary | ICD-10-CM

## 2020-06-19 MED ORDER — FLUTICASONE PROPIONATE 50 MCG/ACT NA SUSP: 1.0000 | Freq: Every day | NASAL | 12 refills | Status: DC | PRN

## 2020-06-19 NOTE — Telephone Encounter (Signed)
[facility] faxed refill request for the following medications:   fluticasone (FLONASE) 50 MCG/ACT nasal spray   Please advise.  

## 2020-08-14 ENCOUNTER — Telehealth: Payer: Self-pay | Admitting: Family Medicine

## 2020-08-14 DIAGNOSIS — J302 Other seasonal allergic rhinitis: Secondary | ICD-10-CM

## 2020-08-14 NOTE — Telephone Encounter (Signed)
[facility] faxed refill request for the following medications:  montelukast (SINGULAIR) 10 MG tablet  Last Rx: 08/09/19 LOV: 05/22/20 Please advise. Thanks TNP

## 2020-08-15 MED ORDER — MONTELUKAST SODIUM 10 MG PO TABS: 10.0000 mg | ORAL_TABLET | Freq: Every day | ORAL | 1 refills | Status: DC

## 2020-10-28 ENCOUNTER — Other Ambulatory Visit: Payer: Self-pay | Admitting: Family Medicine

## 2020-10-28 DIAGNOSIS — I10 Essential (primary) hypertension: Secondary | ICD-10-CM

## 2020-10-28 NOTE — Telephone Encounter (Signed)
Requested Prescriptions  Pending Prescriptions Disp Refills  . quinapril-hydrochlorothiazide (ACCURETIC) 10-12.5 MG tablet [Pharmacy Med Name: QUINAPRIL/HCTZ 10-12.'5MG'$  TABLETS] 90 tablet 0    Sig: TAKE 1 TABLET BY MOUTH DAILY     Cardiovascular:  ACEI + Diuretic Combos Passed - 10/28/2020 11:23 AM      Passed - Na in normal range and within 180 days    Sodium  Date Value Ref Range Status  05/22/2020 140 134 - 144 mmol/L Final         Passed - K in normal range and within 180 days    Potassium  Date Value Ref Range Status  05/22/2020 4.2 3.5 - 5.2 mmol/L Final         Passed - Cr in normal range and within 180 days    Creat  Date Value Ref Range Status  12/17/2016 0.59 0.50 - 0.99 mg/dL Final    Comment:    For patients >54 years of age, the reference limit for Creatinine is approximately 13% higher for people identified as African-American. .    Creatinine, Ser  Date Value Ref Range Status  05/22/2020 0.67 0.57 - 1.00 mg/dL Final         Passed - Ca in normal range and within 180 days    Calcium  Date Value Ref Range Status  05/22/2020 9.0 8.7 - 10.3 mg/dL Final         Passed - Patient is not pregnant      Passed - Last BP in normal range    BP Readings from Last 1 Encounters:  05/22/20 127/82         Passed - Valid encounter within last 6 months    Recent Outpatient Visits          5 months ago Essential hypertension   [facility] [place], Dionne Bucy, MD   10 months ago Essential hypertension   [place], Dionne Bucy, MD   1 year ago Essential hypertension   [place], Dionne Bucy, MD   1 year ago Sinus congestion   [place], Kelby Aline, FNP   1 year ago Essential hypertension   [place], Dionne Bucy, MD      Future Appointments            In 1 month [place], Dionne Bucy, MD [facility] [place]

## 2020-11-30 ENCOUNTER — Encounter: Payer: Self-pay | Admitting: Family Medicine

## 2020-11-30 ENCOUNTER — Other Ambulatory Visit: Payer: Self-pay

## 2020-11-30 ENCOUNTER — Ambulatory Visit: Payer: Federal, State, Local not specified - PPO | Admitting: Family Medicine

## 2020-11-30 ENCOUNTER — Ambulatory Visit: Payer: Self-pay | Admitting: *Deleted

## 2020-11-30 VITALS — BP 139/81 | HR 77 | Temp 98.4°F | Ht 64.0 in | Wt 155.9 lb

## 2020-11-30 DIAGNOSIS — K760 Fatty (change of) liver, not elsewhere classified: Secondary | ICD-10-CM | POA: Diagnosis not present

## 2020-11-30 DIAGNOSIS — R7303 Prediabetes: Secondary | ICD-10-CM | POA: Diagnosis not present

## 2020-11-30 DIAGNOSIS — J302 Other seasonal allergic rhinitis: Secondary | ICD-10-CM

## 2020-11-30 DIAGNOSIS — I10 Essential (primary) hypertension: Secondary | ICD-10-CM | POA: Diagnosis not present

## 2020-11-30 DIAGNOSIS — Z23 Encounter for immunization: Secondary | ICD-10-CM

## 2020-11-30 DIAGNOSIS — D696 Thrombocytopenia, unspecified: Secondary | ICD-10-CM

## 2020-11-30 DIAGNOSIS — E782 Mixed hyperlipidemia: Secondary | ICD-10-CM | POA: Diagnosis not present

## 2020-11-30 DIAGNOSIS — E663 Overweight: Secondary | ICD-10-CM

## 2020-11-30 MED ORDER — MONTELUKAST SODIUM 10 MG PO TABS: 10.0000 mg | ORAL_TABLET | Freq: Every day | ORAL | 3 refills | Status: DC

## 2020-11-30 MED ORDER — QUINAPRIL-HYDROCHLOROTHIAZIDE 10-12.5 MG PO TABS: 1.0000 | ORAL_TABLET | Freq: Every day | ORAL | 3 refills | Status: DC

## 2020-11-30 NOTE — Assessment & Plan Note (Signed)
Recommend low carb diet °Recheck A1c  °

## 2020-11-30 NOTE — Assessment & Plan Note (Addendum)
Reviewed last lipid panel - mild Not currently on a statin Recheck FLP at next visit Discussed diet and exercise

## 2020-11-30 NOTE — Assessment & Plan Note (Signed)
Well controlled Continue current medications Recheck metabolic panel F/u in 6 months  

## 2020-11-30 NOTE — Telephone Encounter (Signed)
Pt called to report that her arm is extremely sore after receiving her shingles vaccine. She has questions about the level of discomfort she is experiencing.  Best contact: [phone]   Called patient to review symptoms of soreness in left arm after receiving shingles vaccination today during OV at 8:30 am. Patient reports she has had similar reaction with the covid vaccination. Denies redness or swelling in left arm where vaccination given. Denies fever, difficulty breathing or redness at injection site. Pain and soreness in left arm is 8/10 at this time and patient reports difficulty moving arm due to soreness. Patient reports she is not able to take tylenol for pain . Recommended ibuprofen per manufacturer's directions if she has not been told by physician not to take . Care advise given. Patient verbalized understanding and to call back or go to ED if symptoms worsen.

## 2020-11-30 NOTE — Telephone Encounter (Signed)
Noted  

## 2020-11-30 NOTE — Assessment & Plan Note (Signed)
Monitor LFTs 

## 2020-11-30 NOTE — Assessment & Plan Note (Signed)
Discussed importance of healthy weight management Discussed diet and exercise  

## 2020-11-30 NOTE — Telephone Encounter (Signed)
Reason for Disposition  Shingles (Herpes zoster; Shingrix) vaccine reactions  Answer Assessment - Initial Assessment Questions 1. SYMPTOMS: "What is the main symptom?" (e.g., redness, swelling, pain)      Pain and soreness in left arm after receiving shingles ( Shingrix) vaccination this am  2. ONSET: "When was the vaccine (shot) given?" "How much later did the soreness begin?" (e.g., hours, days ago)      Today 11/30/20.  3. SEVERITY: "How bad is it?"      Soreness is 8/10 pain level. Difficulty moving arm  4. FEVER: "Is there a fever?" If Yes, ask: "What is it, how was it measured, and when did it start?"      no 5. IMMUNIZATIONS GIVEN: "What shots have you recently received?"     shingrix 6. PAST REACTIONS: "Have you reacted to immunizations before?" If Yes, ask: "What happened?"     Yes . Arm gets sore  7. OTHER SYMPTOMS: "Do you have any other symptoms?"     No  Protocols used: Immunization Reactions-A-AH

## 2020-11-30 NOTE — Progress Notes (Addendum)
Established patient visit   Patient: Alyssa Thornton   DOB: 1955-12-28   65 y.o. Female  MRN: [id number] Visit Date: 11/30/2020  Today's healthcare provider: Lavon Paganini, MD   Chief Complaint  Patient presents with   Follow-up   Hypertension     Subjective  -------------------------------------------------------------------------------------------------------------------- Hypertension Pertinent negatives include no chest pain, headaches, neck pain, palpitations or shortness of breath.   Hypertension, follow-up  BP Readings from Last 3 Encounters:  05/22/20 127/82  12/06/19 110/69  06/29/19 110/70   Wt Readings from Last 3 Encounters:  05/22/20 151 lb 8 oz (68.7 kg)  12/06/19 149 lb 6.4 oz (67.8 kg)  06/29/19 145 lb (65.8 kg)     She was last seen for hypertension 6 months ago.  BP at that visit was 127/82. Management since that visit includes continue current medications.  Tolerating HCTZ with no complaints.  She reports good compliance with treatment. She is not having side effects. not She is following a Low Sodium diet. She is not exercising. She does not smoke.  Use of agents associated with hypertension: none.   Outside blood pressures are none.  Symptoms: No chest pain No chest pressure  No palpitations No syncope  No dyspnea No orthopnea  No paroxysmal nocturnal dyspnea No lower extremity edema   Vaccines She is interested in the shingrix vaccine. She received the older version but is eligible for the newer dosage. She is agreeable to receiving her first dosage in office. She was notified to check with insurance for coverage.  Pertinent labs: Lab Results  Component Value Date   CHOL 198 05/22/2020   HDL 73 05/22/2020   LDLCALC 112 (H) 05/22/2020   TRIG 69 05/22/2020   CHOLHDL 2.7 05/22/2020   Lab Results  Component Value Date   NA 140 05/22/2020   K 4.2 05/22/2020   CREATININE 0.67 05/22/2020   GFRNONAA 92 05/22/2020   GFRAA 106  05/22/2020   GLUCOSE 113 (H) 05/22/2020     The 10-year ASCVD risk score (Goff DC Jr., et al., 2013) is: 8.6%   ---------------------------------------------------------------------------------------------------   Patient Active Problem List   Diagnosis Date Noted   Postnasal drip 12/06/2019   Skin tags, multiple acquired 12/06/2019   Thrombocytopenia ([place]) 05/26/2019   Family history of ovarian cancer 06/16/2018   Prediabetes 05/05/2018   Overweight 05/05/2018   Abnormal mammogram of left breast 02/17/2017   Bilateral carpal tunnel syndrome 12/17/2016   Allergic rhinitis 03/31/2015   Hyperlipidemia 03/31/2015   Accumulation of fluid in tissues 03/31/2015   Fatty infiltration of liver 03/31/2015   Essential hypertension 03/31/2015   Headache, migraine 03/31/2015   Plantar fasciitis 03/31/2015   Atypical hyperplasia of left breast 07/10/2013   Past Medical History:  Diagnosis Date   Atypical hyperplasia of left breast 07/02/2013   1 mm area of atypia associated with a calcified papilloma.   Fatty liver    Hypertension    Osteopenia    Postmenopausal atrophic vaginitis    Allergies  Allergen Reactions   Diclofenac Sodium Diarrhea   Tramadol Diarrhea   Levaquin [Levofloxacin] Nausea And Vomiting       Medications: Outpatient Medications Prior to Visit  Medication Sig   Cholecalciferol (VITAMIN D-3 PO) Take 4,000 Int'l Units by mouth once. Once daily   fluticasone (FLONASE) 50 MCG/ACT nasal spray Place 1 spray into both nostrils daily as needed.   montelukast (SINGULAIR) 10 MG tablet Take 1 tablet (10 mg total) by mouth daily.  Multiple Vitamin (MULTIVITAMIN) tablet Take 1 tablet by mouth daily.   quinapril-hydrochlorothiazide (ACCURETIC) 10-12.5 MG tablet TAKE 1 TABLET BY MOUTH DAILY   vitamin C (ASCORBIC ACID) 500 MG tablet Take 500 mg by mouth daily.   No facility-administered medications prior to visit.    Review of Systems  Constitutional:  Negative for  chills, fatigue and fever.  HENT:  Negative for ear pain, sinus pressure, sinus pain and sore throat.   Eyes:  Negative for pain and visual disturbance.  Respiratory:  Negative for cough, chest tightness, shortness of breath and wheezing.   Cardiovascular:  Negative for chest pain, palpitations and leg swelling.  Gastrointestinal:  Negative for abdominal pain, blood in stool, diarrhea, nausea and vomiting.  Genitourinary:  Negative for flank pain, frequency, pelvic pain and urgency.  Musculoskeletal:  Negative for back pain, myalgias and neck pain.  Neurological:  Negative for dizziness, weakness, light-headedness, numbness and headaches.   Last CBC Lab Results  Component Value Date   WBC 4.6 05/22/2020   HGB 15.3 05/22/2020   HCT 44.7 05/22/2020   MCV 91 05/22/2020   MCH 31.2 05/22/2020   RDW 12.3 05/22/2020   PLT 132 (L) [id number]   Last metabolic panel Lab Results  Component Value Date   GLUCOSE 113 (H) 05/22/2020   NA 140 05/22/2020   K 4.2 05/22/2020   CL 103 05/22/2020   CO2 22 05/22/2020   BUN 15 05/22/2020   CREATININE 0.67 05/22/2020   GFRNONAA 92 05/22/2020   GFRAA 106 05/22/2020   CALCIUM 9.0 05/22/2020   PROT 6.7 05/22/2020   ALBUMIN 4.5 05/22/2020   LABGLOB 2.2 05/22/2020   AGRATIO 2.0 05/22/2020   BILITOT 0.8 05/22/2020   ALKPHOS 161 (H) 05/22/2020   AST 26 05/22/2020   ALT 27 05/22/2020   Last lipids Lab Results  Component Value Date   CHOL 198 05/22/2020   HDL 73 05/22/2020   LDLCALC 112 (H) 05/22/2020   TRIG 69 05/22/2020   CHOLHDL 2.7 05/22/2020   Last hemoglobin A1c Lab Results  Component Value Date   HGBA1C 5.5 05/22/2020   Last thyroid functions Lab Results  Component Value Date   TSH 3.140 05/21/2016       Objective  -------------------------------------------------------------------------------------------------------------------- Today's Vitals   11/30/20 0811  BP: 139/81  Pulse: 77  Temp: 98.4 F (36.9 C)  TempSrc: Oral   SpO2: 97%  Weight: 155 lb 14.4 oz (70.7 kg)  Height: '5\' 4"'$  (1.626 m)   Body mass index is 26.76 kg/m.  BP Readings from Last 3 Encounters:  05/22/20 127/82  12/06/19 110/69  06/29/19 110/70    Wt Readings from Last 3 Encounters:  05/22/20 151 lb 8 oz (68.7 kg)  12/06/19 149 lb 6.4 oz (67.8 kg)  06/29/19 145 lb (65.8 kg)       Physical Exam Vitals reviewed.  Constitutional:      General: She is not in acute distress.    Appearance: Normal appearance. She is well-developed. She is not diaphoretic.  HENT:     Head: Normocephalic and atraumatic.  Eyes:     General: No scleral icterus.    Conjunctiva/sclera: Conjunctivae normal.  Neck:     Thyroid: No thyromegaly.  Cardiovascular:     Rate and Rhythm: Normal rate and regular rhythm.     Pulses: Normal pulses.     Heart sounds: Normal heart sounds. No murmur heard. Pulmonary:     Effort: Pulmonary effort is normal. No respiratory distress.  Breath sounds: Normal breath sounds. No wheezing, rhonchi or rales.  Musculoskeletal:     Cervical back: Neck supple.     Right lower leg: No edema.     Left lower leg: No edema.  Lymphadenopathy:     Cervical: No cervical adenopathy.  Skin:    General: Skin is warm and dry.     Findings: No rash.  Neurological:     Mental Status: She is alert and oriented to person, place, and time. Mental status is at baseline.  Psychiatric:        Mood and Affect: Mood normal.        Behavior: Behavior normal.      No results found for any visits on 11/30/20.  Assessment & Plan  ---------------------------------------------------------------------------------------------------------------------- Problem List Items Addressed This Visit       Cardiovascular and Mediastinum   Essential hypertension - Primary    Well controlled Continue current medications Recheck metabolic panel F/u in 6 months         Digestive   Fatty infiltration of liver    Monitor LFTs        Other    Hyperlipidemia    Reviewed last lipid panel - mild Not currently on a statin Recheck FLP at next visit Discussed diet and exercise       Prediabetes    Recommend low carb diet Recheck A1c       Overweight    Discussed importance of healthy weight management Discussed diet and exercise       Thrombocytopenia (HCC)    Mild, asymptomatic Recheck CBC       Return in about 6 months (around 06/02/2021) for chronic disease f/u.     I,Essence Turner,acting as a Education administrator for Lavon Paganini, MD.,have documented all relevant documentation on the behalf of Lavon Paganini, MD,as directed by  Lavon Paganini, MD while in the presence of Lavon Paganini, MD.  I, Lavon Paganini, MD, have reviewed all documentation for this visit. The documentation on 11/30/20 for the exam, diagnosis, procedures, and orders are all accurate and complete.   Anders Hohmann, Dionne Bucy, MD, MPH [facility]

## 2020-11-30 NOTE — Assessment & Plan Note (Signed)
Mild, asymptomatic Recheck CBC

## 2020-11-30 NOTE — Addendum Note (Signed)
Addended by: Barnie Mort on: 11/30/2020 09:29 AM   Modules accepted: Orders

## 2020-12-01 ENCOUNTER — Telehealth: Payer: Self-pay

## 2020-12-01 DIAGNOSIS — K760 Fatty (change of) liver, not elsewhere classified: Secondary | ICD-10-CM

## 2020-12-01 LAB — CBC
Hematocrit: 43.7 % (ref 34.0–46.6)
Hemoglobin: 14.8 g/dL (ref 11.1–15.9)
MCH: 30.5 pg (ref 26.6–33.0)
MCHC: 33.9 g/dL (ref 31.5–35.7)
MCV: 90 fL (ref 79–97)
Platelets: 136 10*3/uL — ABNORMAL LOW (ref 150–450)
RBC: 4.85 x10E6/uL (ref 3.77–5.28)
RDW: 12.4 % (ref 11.7–15.4)
WBC: 3.9 10*3/uL (ref 3.4–10.8)

## 2020-12-01 LAB — COMPREHENSIVE METABOLIC PANEL
ALT: 34 IU/L — ABNORMAL HIGH (ref 0–32)
AST: 33 IU/L (ref 0–40)
Albumin/Globulin Ratio: 2.2 (ref 1.2–2.2)
Albumin: 4.4 g/dL (ref 3.8–4.8)
Alkaline Phosphatase: 176 IU/L — ABNORMAL HIGH (ref 44–121)
BUN/Creatinine Ratio: 21 (ref 12–28)
BUN: 14 mg/dL (ref 8–27)
Bilirubin Total: 1 mg/dL (ref 0.0–1.2)
CO2: 21 mmol/L (ref 20–29)
Calcium: 9.2 mg/dL (ref 8.7–10.3)
Chloride: 103 mmol/L (ref 96–106)
Creatinine, Ser: 0.66 mg/dL (ref 0.57–1.00)
Globulin, Total: 2 g/dL (ref 1.5–4.5)
Glucose: 121 mg/dL — ABNORMAL HIGH (ref 65–99)
Potassium: 3.9 mmol/L (ref 3.5–5.2)
Sodium: 142 mmol/L (ref 134–144)
Total Protein: 6.4 g/dL (ref 6.0–8.5)
eGFR: 96 mL/min/{1.73_m2} (ref >59)

## 2020-12-01 LAB — HEMOGLOBIN A1C
Est. average glucose Bld gHb Est-mCnc: 117 mg/dL
Hgb A1c MFr Bld: 5.7 % — ABNORMAL HIGH (ref 4.8–5.6)

## 2020-12-01 NOTE — Telephone Encounter (Signed)
-----   Message from Virginia Crews, MD sent at 12/01/2020  8:04 AM EDT ----- Stable labs, except for increasing liver function tests. Would recommend GI referral for further evaluation.

## 2020-12-09 ENCOUNTER — Encounter: Payer: Self-pay | Admitting: Family Medicine

## 2020-12-26 ENCOUNTER — Other Ambulatory Visit: Payer: Self-pay

## 2020-12-26 ENCOUNTER — Encounter: Payer: Self-pay | Admitting: Obstetrics and Gynecology

## 2020-12-26 ENCOUNTER — Ambulatory Visit (INDEPENDENT_AMBULATORY_CARE_PROVIDER_SITE_OTHER): Payer: Federal, State, Local not specified - PPO | Admitting: Obstetrics and Gynecology

## 2020-12-26 VITALS — BP 100/60 | Ht 62.0 in | Wt 152.0 lb

## 2020-12-26 DIAGNOSIS — N941 Unspecified dyspareunia: Secondary | ICD-10-CM

## 2020-12-26 DIAGNOSIS — Z8041 Family history of malignant neoplasm of ovary: Secondary | ICD-10-CM

## 2020-12-26 DIAGNOSIS — Z01419 Encounter for gynecological examination (general) (routine) without abnormal findings: Secondary | ICD-10-CM | POA: Diagnosis not present

## 2020-12-26 DIAGNOSIS — N952 Postmenopausal atrophic vaginitis: Secondary | ICD-10-CM | POA: Insufficient documentation

## 2020-12-26 DIAGNOSIS — Z1231 Encounter for screening mammogram for malignant neoplasm of breast: Secondary | ICD-10-CM | POA: Diagnosis not present

## 2020-12-26 MED ORDER — PREMARIN 0.625 MG/GM VA CREA: TOPICAL_CREAM | VAGINAL | 2 refills | Status: DC

## 2020-12-26 NOTE — Progress Notes (Signed)
PCP: Erasmo Downer, MD   Chief Complaint  Patient presents with   Gynecologic Exam    No concerns    HPI:      [name] is a 65 y.o. G0P0000 who LMP was No LMP recorded. Patient is postmenopausal., presents today for her annual examination.  Her menses are absent due to menopause. She does not have PMB. No vasomotor sx.  Sex activity: single partner, not as sex active anymore; last yr husband with recent prostate cancer tx, but now has severe pain with sex, even when did prem vag crm, stopped about a yr ago. Tried coconut oil recently without relief.   Last Pap: 04/22/17 Results were: no abnormalities /neg HPV DNA.  Hx of STDs: none  Last mammogram: 09/21/19 at [facility], Results were: normal--routine follow-up in 12 months. Released by Dr. Lemar Livings, was on tamoxifen for 5 yrs due to LT breast atypical hyperplasia 2014; stopped 2020 There is no FH of breast cancer. There is a FH of ovarian cancer in her MGM and stomach cancer in her mat aunt, but pt hadn't met genetic testing guidelines with her [employer]. Will try again this yr. The patient does do self-breast exams.  Colonoscopy: 2015 with Dr. Servando Snare, repeat due after 10 yrs DEXA: 2018/2020--stable osteopenia in hip, osteoporosis in spine.  Tobacco use: The patient denies current or previous tobacco use. Alcohol use: none  No drug use Exercise: very active  She does get adequate calcium and Vitamin D in her diet.  Labs with PCP.   Past Medical History:  Diagnosis Date   Atypical hyperplasia of left breast 07/02/2013   1 mm area of atypia associated with a calcified papilloma.   Fatty liver    Hypertension    Osteopenia    Postmenopausal atrophic vaginitis     Past Surgical History:  Procedure Laterality Date   bartholins gland surgery     BREAST BIOPSY Left 11/15/2005   LEFT BREAST, CORE BIOPSY:: STROMAL SCLEROSIS WITHIN BENIGN ATROPHIC BREAST TISSUE   BREAST BIOPSY Left July 02, 2013   left  breast papilloma with focal atypia   BREAST EXCISIONAL BIOPSY Left 2015   COLONOSCOPY  2007-2008, 2015   Dr Byrnett/Dr Servando Snare   TONSILLECTOMY AND ADENOIDECTOMY  1960    Family History  Problem Relation Age of Onset   Heart disease Father    Hypertension Father    Cancer Sister        bone marrow cancer   Diabetes Mother        type 2   Cirrhosis Mother    Hypercholesterolemia Brother    Hyperlipidemia Brother    Cancer - Ovarian Maternal Grandmother        41   Stomach cancer Other 9   Breast cancer Neg Hx     Social History   Socioeconomic History   Marital status: Married    Spouse name: Not on file   Number of children: Not on file   Years of education: Not on file   Highest education level: Not on file  Occupational History   Not on file  Tobacco Use   Smoking status: Never   Smokeless tobacco: Never  Vaping Use   Vaping Use: Never used  Substance and Sexual Activity   Alcohol use: No   Drug use: No   Sexual activity: Not Currently    Birth control/protection: Post-menopausal  Other Topics Concern   Not on file  Social History Narrative  Not on file   Social Determinants of Health   Financial Resource Strain: Not on file  Food Insecurity: Not on file  Transportation Needs: Not on file  Physical Activity: Not on file  Stress: Not on file  Social Connections: Not on file  Intimate Partner Violence: Not on file    Current Meds  Medication Sig   Cholecalciferol (VITAMIN D-3 PO) Take 1,000 Int'l Units by mouth in the morning and at bedtime. 2 tabs daily with a meal   conjugated estrogens (PREMARIN) vaginal cream Insert 1 g vaginally nightly for 2 wks, then 1 g once weekly as maintenance   fluticasone (FLONASE) 50 MCG/ACT nasal spray Place 1 spray into both nostrils daily as needed.   montelukast (SINGULAIR) 10 MG tablet Take 1 tablet (10 mg total) by mouth daily.   Multiple Vitamins-Minerals (ZINC PO) Take 50 mg by mouth daily. With a meal    quinapril-hydrochlorothiazide (ACCURETIC) 10-12.5 MG tablet Take 1 tablet by mouth daily.   vitamin C (ASCORBIC ACID) 500 MG tablet Take 500 mg by mouth daily.      ROS:  Review of Systems  Constitutional:  Negative for fatigue, fever and unexpected weight change.  Respiratory:  Negative for cough, shortness of breath and wheezing.   Cardiovascular:  Negative for chest pain, palpitations and leg swelling.  Gastrointestinal:  Negative for blood in stool, constipation, diarrhea, nausea and vomiting.  Endocrine: Negative for cold intolerance, heat intolerance and polyuria.  Genitourinary:  Positive for dyspareunia. Negative for dysuria, flank pain, frequency, genital sores, hematuria, menstrual problem, pelvic pain, urgency, vaginal bleeding, vaginal discharge and vaginal pain.  Musculoskeletal:  Negative for back pain, joint swelling and myalgias.  Skin:  Negative for rash.  Neurological:  Negative for dizziness, syncope, light-headedness, numbness and headaches.  Hematological:  Negative for adenopathy.  Psychiatric/Behavioral:  Negative for agitation, confusion, sleep disturbance and suicidal ideas. The patient is not nervous/anxious.     Objective: BP 100/60   Ht $R'5\' 2"'jH$  (1.575 m)   Wt 152 lb (68.9 kg)   BMI 27.80 kg/m    Physical Exam Constitutional:      Appearance: She is well-developed.  Genitourinary:     Vulva normal.     Right Labia: No rash, tenderness or lesions.    Left Labia: No tenderness, lesions or rash.    Vulva exam comments: VAGINAL TISSUE EXT WITH MINIMAL ATROPHY; VAGINAL OPENING ADEQUATE.     No vaginal discharge, erythema or tenderness.     Mild vaginal atrophy present.     Right Adnexa: not tender and no mass present.    Left Adnexa: not tender and no mass present.    No cervical motion tenderness, friability or polyp.     Uterus is not enlarged or tender.  Breasts:    Right: No mass, nipple discharge, skin change or tenderness.     Left: No mass,  nipple discharge, skin change or tenderness.  Neck:     Thyroid: No thyromegaly.  Cardiovascular:     Rate and Rhythm: Normal rate and regular rhythm.     Heart sounds: Normal heart sounds. No murmur heard. Pulmonary:     Effort: Pulmonary effort is normal.     Breath sounds: Normal breath sounds.  Abdominal:     Palpations: Abdomen is soft.     Tenderness: There is no abdominal tenderness. There is no guarding or rebound.  Musculoskeletal:        General: Normal range of motion.  Cervical back: Normal range of motion.  Lymphadenopathy:     Cervical: No cervical adenopathy.  Neurological:     General: No focal deficit present.     Mental Status: She is alert and oriented to person, place, and time.     Cranial Nerves: No cranial nerve deficit.  Skin:    General: Skin is warm and dry.  Psychiatric:        Mood and Affect: Mood normal.        Behavior: Behavior normal.        Thought Content: Thought content normal.        Judgment: Judgment normal.  Vitals reviewed.    Assessment/Plan:  Encounter for annual routine gynecological examination  Encounter for screening mammogram for malignant neoplasm of breast - Plan: MM 3D SCREEN BREAST BILATERAL; pt to sched mammo  Family history of ovarian cancer - Plan: Integrated BRACAnalysis ([place]); MyRisk testing discussed and done today. Will call with results.   Postmenopausal atrophic vaginitis - Plan: conjugated estrogens (PREMARIN) vaginal cream; resume prem vag crm and then use lubricant. If no sx change, may try compounded estriol vs intrarosa. Pt to f/u prn.   Dyspareunia in female - Plan: conjugated estrogens (PREMARIN) vaginal cream         Meds ordered this encounter  Medications   conjugated estrogens (PREMARIN) vaginal cream    Sig: Insert 1 g vaginally nightly for 2 wks, then 1 g once weekly as maintenance    Dispense:  30 g    Refill:  2    Order Specific Question:   Supervising Provider     Answer:   Gae Dry [[id number]]     GYN counsel breast self exam, mammography screening, menopause, adequate intake of calcium and vitamin D, diet and exercise    F/U  Return in about 1 year (around 12/26/2021).  Navi Ewton B. Marni Franzoni, PA-C 12/26/2020 5:09 PM

## 2020-12-26 NOTE — Patient Instructions (Signed)
I value your feedback and you entrusting us with your care. If you get a [facility] patient survey, I would appreciate you taking the time to let us know about your experience today. Thank you!   [facility]: [phone]  

## 2021-01-06 DIAGNOSIS — Z9189 Other specified personal risk factors, not elsewhere classified: Secondary | ICD-10-CM

## 2021-01-06 DIAGNOSIS — Z1371 Encounter for nonprocreative screening for genetic disease carrier status: Secondary | ICD-10-CM

## 2021-01-06 HISTORY — DX: Encounter for nonprocreative screening for genetic disease carrier status: Z13.71

## 2021-01-06 HISTORY — DX: Other specified personal risk factors, not elsewhere classified: Z91.89

## 2021-01-08 ENCOUNTER — Encounter: Payer: Self-pay | Admitting: Obstetrics and Gynecology

## 2021-01-09 ENCOUNTER — Telehealth: Payer: Self-pay

## 2021-01-09 NOTE — Telephone Encounter (Signed)
FYI  Advised patient that she would need to be seen before we could consider taking her out of work.  Also that it is unusual to schedule when to have the patient out of work.  It was also discussed that she would probably need to be on FMLA in order to be out that long and that they would need to approved a leave of absence.  Patient has decided she would like to wait until the hands are worse.     Copied from [place] [id number]. Topic: General - Inquiry >> Jan 09, 2021 10:19 AM Greggory Keen D wrote: Reason for CRM: Pt called saying she needs a note for 30 days to be off of work to rest her hands.  She wants it from Oct 17 to the November 29th.  She does not want to file FMLA.  CB#  [phone]

## 2021-01-11 ENCOUNTER — Encounter: Payer: Self-pay | Admitting: Obstetrics and Gynecology

## 2021-01-11 DIAGNOSIS — Z1231 Encounter for screening mammogram for malignant neoplasm of breast: Secondary | ICD-10-CM | POA: Diagnosis not present

## 2021-01-16 ENCOUNTER — Encounter: Payer: Self-pay | Admitting: Obstetrics and Gynecology

## 2021-01-19 ENCOUNTER — Telehealth: Payer: Self-pay | Admitting: Family Medicine

## 2021-01-19 DIAGNOSIS — I10 Essential (primary) hypertension: Secondary | ICD-10-CM

## 2021-01-20 NOTE — Telephone Encounter (Signed)
Requested medications are due for refill today requesting too soon  Requested medications are on the active medication list yes  Last refill 8/25 for 90 day supply  Last visit 8/25  Future visit scheduled 3/9  Notes to clinic requesting too soon, please assess. This has a duplicate request.  Requested Prescriptions  Pending Prescriptions Disp Refills   quinapril-hydrochlorothiazide (ACCURETIC) 10-12.5 MG tablet [Pharmacy Med Name: QUINAPRIL/HCTZ 10-12.5MG  TABLETS] 90 tablet 3    Sig: Take 1 tablet by mouth daily.     Cardiovascular:  ACEI + Diuretic Combos Passed - 01/19/2021  2:42 PM      Passed - Na in normal range and within 180 days    Sodium  Date Value Ref Range Status  11/30/2020 142 134 - 144 mmol/L Final          Passed - K in normal range and within 180 days    Potassium  Date Value Ref Range Status  11/30/2020 3.9 3.5 - 5.2 mmol/L Final          Passed - Cr in normal range and within 180 days    Creat  Date Value Ref Range Status  12/17/2016 0.59 0.50 - 0.99 mg/dL Final    Comment:    For patients >58 years of age, the reference limit for Creatinine is approximately 13% higher for people identified as African-American. .    Creatinine, Ser  Date Value Ref Range Status  11/30/2020 0.66 0.57 - 1.00 mg/dL Final          Passed - Ca in normal range and within 180 days    Calcium  Date Value Ref Range Status  11/30/2020 9.2 8.7 - 10.3 mg/dL Final          Passed - Patient is not pregnant      Passed - Last BP in normal range    BP Readings from Last 1 Encounters:  12/26/20 100/60          Passed - Valid encounter within last 6 months    Recent Outpatient Visits           1 month ago Essential hypertension   [facility] [place], Dionne Bucy, MD   8 months ago Essential hypertension   [facility] [place], Dionne Bucy, MD   1 year ago Essential hypertension   [facility] [place], Dionne Bucy, MD    1 year ago Essential hypertension   [place], Dionne Bucy, MD   1 year ago Sinus congestion   [place], Kelby Aline, FNP       Future Appointments             In 4 months Bacigalupo, Dionne Bucy, MD [facility]             quinapril-hydrochlorothiazide (ACCURETIC) 10-12.5 MG tablet [Pharmacy Med Name: QUINAPRIL/HCTZ 10-12.5MG  TABLETS] 90 tablet 3    Sig: Take 1 tablet by mouth daily.     Cardiovascular:  ACEI + Diuretic Combos Passed - 01/19/2021  2:42 PM      Passed - Na in normal range and within 180 days    Sodium  Date Value Ref Range Status  11/30/2020 142 134 - 144 mmol/L Final          Passed - K in normal range and within 180 days    Potassium  Date Value Ref Range Status  11/30/2020 3.9 3.5 - 5.2 mmol/L Final  Passed - Cr in normal range and within 180 days    Creat  Date Value Ref Range Status  12/17/2016 0.59 0.50 - 0.99 mg/dL Final    Comment:    For patients >80 years of age, the reference limit for Creatinine is approximately 13% higher for people identified as African-American. .    Creatinine, Ser  Date Value Ref Range Status  11/30/2020 0.66 0.57 - 1.00 mg/dL Final          Passed - Ca in normal range and within 180 days    Calcium  Date Value Ref Range Status  11/30/2020 9.2 8.7 - 10.3 mg/dL Final          Passed - Patient is not pregnant      Passed - Last BP in normal range    BP Readings from Last 1 Encounters:  12/26/20 100/60          Passed - Valid encounter within last 6 months    Recent Outpatient Visits           1 month ago Essential hypertension   [facility] [place], Dionne Bucy, MD   8 months ago Essential hypertension   [place], Dionne Bucy, MD   1 year ago Essential hypertension   [place], Dionne Bucy, MD   1 year ago Essential hypertension   [place], Dionne Bucy, MD   1 year ago Sinus congestion   [place], Kelby Aline, FNP       Future Appointments             In 4 months Bacigalupo, Dionne Bucy, MD [facility] [place]

## 2021-01-25 ENCOUNTER — Telehealth: Payer: Self-pay

## 2021-01-25 NOTE — Telephone Encounter (Signed)
Pt returning ABC's call from Tuesday.  [phone]

## 2021-01-25 NOTE — Telephone Encounter (Signed)
I called pt with [facility] results. Pls tell her I'll call her back on Monday. Thx

## 2021-01-26 NOTE — Telephone Encounter (Signed)
Pt aware.

## 2021-01-26 NOTE — Telephone Encounter (Signed)
Called pt, no answer, LVMTRC. 

## 2021-01-29 NOTE — Telephone Encounter (Signed)
That's fine to rx 1/2 tab of the 20/25 tab daily instead of the 10/12.5

## 2021-01-29 NOTE — Telephone Encounter (Signed)
Pt called saying have the dosge of Quinapril that she the pharmacy told her that they do not dosage of Quinapril that she uses but they do have the

## 2021-01-29 NOTE — Telephone Encounter (Signed)
Pt called saying her pharmacy does not have the exact dosage of Quinapril that she takes but they do have the 20/25 mg's and wants to know if she can take that and cut in half.  CB#  [phone]

## 2021-01-29 NOTE — Telephone Encounter (Signed)
Pt aware of neg MyRisk results.  IBIS=27.4% due to personal hx LT breast atypical hyperplasia 2014, was on tamoxifen for 5 yrs; stopped 2020. Released by Dr. Bary Castilla.  Pt had neg mammo 10/22, interested in breast MRI. Will check with insurance co re: coverage. May want self pay order. Pt to f/u with me.   Patient understands these results only apply to her  (no children), and this is not indicative of genetic testing results of her other family members. It is recommended that her other family members have genetic testing done.  Pt also understands negative genetic testing doesn't mean she will never get any of these cancers.   Hard copy mailed to pt. F/u prn.

## 2021-01-30 ENCOUNTER — Telehealth: Payer: Self-pay

## 2021-01-30 MED ORDER — QUINAPRIL-HYDROCHLOROTHIAZIDE 20-25 MG PO TABS: 0.5000 | ORAL_TABLET | Freq: Every day | ORAL | 0 refills | Status: DC

## 2021-01-30 NOTE — Telephone Encounter (Signed)
Patient called in to inform Dr B that [facility] require a new Rx for the for the Quinapril 20-25 MG that states that she take half the tab daily since the 10-12.5 is not in stock and have the Rx for the Quinapril 10-12.5 MG cancelled especially since she did Please call patient when Rx is sent Ph# [phone]

## 2021-01-30 NOTE — Addendum Note (Signed)
Addended by: Shawna Orleans on: 01/30/2021 11:03 AM   Modules accepted: Orders

## 2021-01-30 NOTE — Telephone Encounter (Signed)
Copied from [place] [id number]. Topic: General - Other >> Jan 30, 2021 12:13 PM Tessa Lerner A wrote: Reason for CRM: The patient has called to say that their preferred pharmacy anticipates that they'll have the patient's  quinapril-hydrochlorothiazide (ACCURETIC) 20-25 MG tablet [[id number]]  If their medication isn't ready tomorrow 01/31/21, the patient would like to know of an alternative location that their prescription can be sent to   Please contact further if needed

## 2021-01-30 NOTE — Telephone Encounter (Signed)
New prescription sent to [facility], patient advised.

## 2021-01-31 ENCOUNTER — Telehealth: Payer: Self-pay | Admitting: Family Medicine

## 2021-01-31 NOTE — Telephone Encounter (Signed)
[facility] called and spoke to [place], [facility] to ask if they have Accuretic 20-25 mg in stock, she says they do not have this medication in stock. Patient called to advised that [facility] is not in stock of this medication, advised to call around to the local pharmacies to ask if they have it in stock. Patient says why is she just now being told to do this, she has 1 pill left and it's my turn to call around to pharmacies. I advised I don't have the available time to do that, she hung up the phone. [facility] pharmacy called, hung up due to extended hold time 15 minutes. Will route to PCP.

## 2021-01-31 NOTE — Telephone Encounter (Signed)
Pt called stating that she is needing to have her medication [facility]april-Hydrochlorothiazide [facility] [place]. She states that they are the only pharmacy that has this medication in stock and she is almost out. Please advise.       [id number] [place] [place] - [address]  [address] [place] [address]  Phone: [phone] Fax: [phone]  Hours: Not open 24 hours

## 2021-01-31 NOTE — Telephone Encounter (Signed)
Medication Refill - Medication:  quinapril-hydrochlorothiazide (ACCURETIC) 20-25 MG tablet  Has the patient contacted their pharmacy? Yes.    *Pt states the [facility] told her they were on back order for most of the pharmacy, and says she is completely out and needs this medication sent to another pharmacy, she also stated someone may have to call the pharmacy first before sending a script to make sure they have some in stock. Please advise*  Preferred Pharmacy (with phone number or street name):  [place], [place] Phone:  [phone]  Fax:  [phone]     Has the patient been seen for an appointment in the last year OR does the patient have an upcoming appointment? Yes.    Agent: Please be advised that RX refills may take up to 3 business days. We ask that you follow-up with your pharmacy.

## 2021-01-31 NOTE — Telephone Encounter (Signed)
Rx was already sent to pharmacy 01/30/2021.

## 2021-02-01 MED ORDER — QUINAPRIL-HYDROCHLOROTHIAZIDE 10-12.5 MG PO TABS: 1.0000 | ORAL_TABLET | Freq: Every day | ORAL | 3 refills | Status: DC

## 2021-02-01 NOTE — Telephone Encounter (Signed)
Quinapril-HCTZ 10-12.5mg  [facility].

## 2021-02-01 NOTE — Addendum Note (Signed)
Addended by: Shawna Orleans on: 02/01/2021 01:14 PM   Modules accepted: Orders

## 2021-02-01 NOTE — Telephone Encounter (Signed)
Update 10/27 [facility] as of last nite,  the quinapril-hydrochlorothiazide (ACCURETIC) 20-25 MG tablet  Please order the existing script of what she had before Pt went to go pick up and stated what she has been on is in   Send to : [id number] [place], [place] - [address]  [address] [place] [address]  Phone: [phone] Fax: [phone]  Hours: Not open 24 hours

## 2021-02-22 ENCOUNTER — Encounter: Payer: Self-pay | Admitting: Gastroenterology

## 2021-02-22 ENCOUNTER — Other Ambulatory Visit: Payer: Self-pay | Admitting: Gastroenterology

## 2021-02-22 ENCOUNTER — Ambulatory Visit (INDEPENDENT_AMBULATORY_CARE_PROVIDER_SITE_OTHER): Payer: Federal, State, Local not specified - PPO | Admitting: Gastroenterology

## 2021-02-22 VITALS — BP 123/78 | HR 80 | Temp 98.2°F | Ht 63.0 in | Wt 150.0 lb

## 2021-02-22 DIAGNOSIS — R7989 Other specified abnormal findings of blood chemistry: Secondary | ICD-10-CM

## 2021-02-22 NOTE — Progress Notes (Signed)
Gastroenterology Consultation  Referring Provider:     Virginia Crews, MD Primary Care Physician:  Virginia Crews, MD Primary Gastroenterologist:  Dr. Allen Norris     Reason for Consultation:     Fatty liver        HPI:   Alyssa Thornton is a 65 y.o. y/o female referred for consultation & management of fatty liver by Dr. Brita Romp, Dionne Bucy, MD. This patient comes to see me after having some blood test that showed her to have an elevated alkaline phosphatase for some time.  Her labs have shown:  Component     Latest Ref Rng & Units 05/05/2018 11/23/2018 05/26/2019 12/06/2019  Alkaline Phosphatase     44 - 121 IU/L 145 (H)  153 (H)   AST     0 - 40 IU/L 24  30   ALT     0 - 32 IU/L 21  31    Component     Latest Ref Rng & Units 05/22/2020 11/30/2020  Alkaline Phosphatase     44 - 121 IU/L 161 (H) 176 (H)  AST     0 - 40 IU/L 26 33  ALT     0 - 32 IU/L 27 34 (H)    The patient also had a right upper quadrant ultrasound that showed her to have findings consistent with possible fatty liver.  The patient had a colonoscopy by me in 2015 and a repeat colonoscopy was recommended in 10 years.  The patient GGT was 67 with the upper limit of normal being 60 back in February 2021.  The patient reports that her mother died of cirrhosis but she does not know what the cause was but does state the mother did not drink or use IV drugs.  The patient also reports that she has been told that she is prediabetic but does not have diabetes.  There is no report of any unexplained weight loss fevers chills nausea vomiting black stools or bloody stools.  She also denies any jaundice.  Past Medical History:  Diagnosis Date   Atypical hyperplasia of left breast 07/02/2013   1 mm area of atypia associated with a calcified papilloma.   BRCA negative 01/2021   MyRisk neg   Fatty liver    Hypertension    Increased risk of breast cancer 01/2021   IBIS=27.4%   Osteopenia    Postmenopausal atrophic  vaginitis     Past Surgical History:  Procedure Laterality Date   bartholins gland surgery     BREAST BIOPSY Left 11/15/2005   LEFT BREAST, CORE BIOPSY:: STROMAL SCLEROSIS WITHIN BENIGN ATROPHIC BREAST TISSUE   BREAST BIOPSY Left July 02, 2013   left breast papilloma with focal atypia   BREAST EXCISIONAL BIOPSY Left 2015   COLONOSCOPY  2007-2008, 2015   Dr Byrnett/Dr Allen Norris   TONSILLECTOMY AND ADENOIDECTOMY  1960    Prior to Admission medications   Medication Sig Start Date End Date Taking? Authorizing Provider  Cholecalciferol (VITAMIN D-3 PO) Take 1,000 Int'l Units by mouth in the morning and at bedtime. 2 tabs daily with a meal    [provider]  conjugated estrogens (PREMARIN) vaginal cream Insert 1 g vaginally nightly for 2 wks, then 1 g once weekly as maintenance 0/17/51   Copland, Alicia B, PA-C  fluticasone (FLONASE) 50 MCG/ACT nasal spray Place 1 spray into both nostrils daily as needed. 06/19/20   Virginia Crews, MD  montelukast (SINGULAIR) 10 MG tablet Take  1 tablet (10 mg total) by mouth daily. 11/30/20   Virginia Crews, MD  Multiple Vitamins-Minerals (ZINC PO) Take 50 mg by mouth daily. With a meal    [provider]  quinapril-hydrochlorothiazide (ACCURETIC) 10-12.5 MG tablet Take 1 tablet by mouth daily. 02/01/21   Bacigalupo, Dionne Bucy, MD  vitamin C (ASCORBIC ACID) 500 MG tablet Take 500 mg by mouth daily.    [provider]    Family History  Problem Relation Age of Onset   Heart disease Father    Hypertension Father    Cancer Sister        bone marrow cancer   Diabetes Mother        type 2   Cirrhosis Mother    Hypercholesterolemia Brother    Hyperlipidemia Brother    Cancer - Ovarian Maternal Grandmother        90   Stomach cancer Other 73   Breast cancer Neg Hx      Social History   Tobacco Use   Smoking status: Never   Smokeless tobacco: Never  Vaping Use   Vaping Use: Never used  Substance Use Topics    Alcohol use: No   Drug use: No    Allergies as of 02/22/2021 - Review Complete 12/26/2020  Allergen Reaction Noted   Diclofenac sodium Diarrhea 06/16/2013   Tramadol Diarrhea 06/16/2013   Levaquin [levofloxacin] Nausea And Vomiting 05/21/2016    Review of Systems:    All systems reviewed and negative except where noted in HPI.   Physical Exam:  There were no vitals taken for this visit. No LMP recorded. Patient is postmenopausal. General:   Alert,  Well-developed, well-nourished, pleasant and cooperative in NAD Head:  Normocephalic and atraumatic. Eyes:  Sclera clear, no icterus.   Conjunctiva pink. Ears:  Normal auditory acuity. Neck:  Supple; no masses or thyromegaly. Lungs:  Respirations even and unlabored.  Clear throughout to auscultation.   No wheezes, crackles, or rhonchi. No acute distress. Heart:  Regular rate and rhythm; no murmurs, clicks, rubs, or gallops. Abdomen:  Normal bowel sounds.  No bruits.  Soft, non-tender and non-distended without masses, hepatosplenomegaly or hernias noted.  No guarding or rebound tenderness.  Negative Carnett sign.   Rectal:  Deferred.  Pulses:  Normal pulses noted. Extremities:  No clubbing or edema.  No cyanosis. Neurologic:  Alert and oriented x3;  grossly normal neurologically. Skin:  Intact without significant lesions or rashes.  No jaundice. Lymph Nodes:  No significant cervical adenopathy. Psych:  Alert and cooperative. Normal mood and affect.  Imaging Studies: No results found.  Assessment and Plan:   Alyssa Thornton is a 65 y.o. y/o female who comes in today with a history of abnormal liver enzymes with increased alkaline phosphatase and most recently an increased ALT.  The patient will have blood work sent off for other possible causes of abnormal liver enzymes.  The patient's BMI is only 26.5 and the patient has been told that since her cholesterol was not high nor her triglycerides are high that if it turns out that she has  fatty liver that weight loss would be her best option for controlling the fat in her liver.  The patient will also be tested for immunity to hepatitis a and B and be vaccinated accordingly.  The patient has been explained the plan and agrees with it.     [name], MD. Mar[facility] Regal    Note: This dictation was prepared with Dragon dictation along  with smaller phrase technology. Any transcriptional errors that result from this process are unintentional.

## 2021-02-23 LAB — ANA: Anti Nuclear Antibody (ANA): NEGATIVE

## 2021-02-25 LAB — HEPATIC FUNCTION PANEL
ALT: 31 IU/L (ref 0–32)
AST: 31 IU/L (ref 0–40)
Albumin: 4.6 g/dL (ref 3.8–4.8)
Alkaline Phosphatase: 150 IU/L — ABNORMAL HIGH (ref 44–121)
Bilirubin Total: 0.9 mg/dL (ref 0.0–1.2)
Bilirubin, Direct: 0.22 mg/dL (ref 0.00–0.40)
Total Protein: 6.2 g/dL (ref 6.0–8.5)

## 2021-02-25 LAB — IRON,TIBC AND FERRITIN PANEL
Ferritin: 272 ng/mL — ABNORMAL HIGH (ref 15–150)
Iron Saturation: 38 % (ref 15–55)
Iron: 121 ug/dL (ref 27–139)
Total Iron Binding Capacity: 318 ug/dL (ref 250–450)
UIBC: 197 ug/dL (ref 118–369)

## 2021-02-25 LAB — HEPATITIS B SURFACE ANTIGEN: Hepatitis B Surface Ag: NEGATIVE

## 2021-02-25 LAB — ANTI-SMOOTH MUSCLE ANTIBODY, IGG: Smooth Muscle Ab: 13 Units (ref 0–19)

## 2021-02-25 LAB — HEPATITIS A ANTIBODY, TOTAL: hep A Total Ab: NEGATIVE

## 2021-02-25 LAB — ALPHA-1-ANTITRYPSIN: A-1 Antitrypsin: 128 mg/dL (ref 101–187)

## 2021-02-25 LAB — HEPATITIS B SURFACE ANTIBODY,QUALITATIVE: Hep B Surface Ab, Qual: NONREACTIVE

## 2021-02-25 LAB — CERULOPLASMIN: Ceruloplasmin: 17.3 mg/dL — ABNORMAL LOW (ref 19.0–39.0)

## 2021-02-25 LAB — MITOCHONDRIAL ANTIBODIES: Mitochondrial Ab: <20 Units (ref 0.0–20.0)

## 2021-03-06 ENCOUNTER — Telehealth: Payer: Self-pay

## 2021-03-06 ENCOUNTER — Other Ambulatory Visit
Admission: RE | Admit: 2021-03-06 | Discharge: 2021-03-06 | Disposition: A | Discharge status: Home / Self Care | Payer: Federal, State, Local not specified - PPO | Attending: Gastroenterology | Admitting: Gastroenterology

## 2021-03-06 ENCOUNTER — Other Ambulatory Visit: Payer: Self-pay

## 2021-03-06 ENCOUNTER — Telehealth: Payer: Self-pay | Admitting: Gastroenterology

## 2021-03-06 DIAGNOSIS — R7989 Other specified abnormal findings of blood chemistry: Secondary | ICD-10-CM

## 2021-03-06 NOTE — Telephone Encounter (Signed)
-----   Message from [name], MD sent at 03/05/2021 11:05 AM EST ----- Please let the patient know that the blood test showed that the alkaline phosphatase was slightly lower than previously seen and her other liver enzymes are normal.  She should be vaccinated for hepatitis A and hepatitis B since there is no immunity on her blood work.  She should also have the alkaline phosphatase fractionated to see if it is coming from a bone source or liver source.

## 2021-03-06 NOTE — Telephone Encounter (Signed)
Inbound call from pt requesting a call back about her results. Please advise. Thank you.

## 2021-03-06 NOTE — Telephone Encounter (Signed)
Pt notified of lab results. Pt will think about getting the Hep A & B vaccines. Pt will go to [facility] outpatient lab to have labs done.

## 2021-03-06 NOTE — Telephone Encounter (Signed)
Returned pt's call and discussed results of the labs.

## 2021-03-09 ENCOUNTER — Telehealth: Payer: Self-pay

## 2021-03-09 LAB — ALKALINE PHOSPHATASE, ISOENZYMES
Alk Phos Bone Fract: 58 % (ref 14–68)
Alk Phos Liver Fract: 15 % — ABNORMAL LOW (ref 18–85)
Alk Phos: 159 IU/L — ABNORMAL HIGH (ref 44–121)
Intestinal %: 27 % — ABNORMAL HIGH (ref 0–18)

## 2021-03-09 NOTE — Telephone Encounter (Signed)
Pt notified of lab results

## 2021-03-09 NOTE — Telephone Encounter (Signed)
-----  Message from [name], MD sent at 03/09/2021 11:44 AM EST ----- Let the patient know the alk phos is not from the liver and no further liver work up is needed or this.

## 2021-03-16 ENCOUNTER — Telehealth: Payer: Self-pay | Admitting: Family Medicine

## 2021-03-16 NOTE — Telephone Encounter (Signed)
Patient called in to find out if she should schedule a followup appt from seeing gastroenterologist. She says they haven't been calling her back for any info from the visit.

## 2021-03-19 NOTE — Telephone Encounter (Signed)
Patient advised as below. She reports she did more labs done and that Dr. Durwin Reges has called her to let her know that they will just continue to monitor. I did advise patient to call back sooner if she needs an appt for any abnormal changes. Patient agreed.

## 2021-03-19 NOTE — Telephone Encounter (Signed)
This is what Dr Allen Norris said after last labs: Let the patient know the alk phos is not from the liver and no further liver work up is needed or this.

## 2021-04-11 DIAGNOSIS — J019 Acute sinusitis, unspecified: Secondary | ICD-10-CM | POA: Diagnosis not present

## 2021-04-11 DIAGNOSIS — B9689 Other specified bacterial agents as the cause of diseases classified elsewhere: Secondary | ICD-10-CM | POA: Diagnosis not present

## 2021-04-12 ENCOUNTER — Ambulatory Visit: Payer: Federal, State, Local not specified - PPO | Admitting: Family Medicine

## 2021-04-13 IMAGING — US US ABDOMEN LIMITED
1 series · 14 of 25 positions shown · non-contrast
Comparison: 05/17/2013

CLINICAL DATA: Elevated alkaline phosphatase

EXAM:
ULTRASOUND ABDOMEN LIMITED RIGHT UPPER QUADRANT

[Series 1: us abdomen limited · 0.19mm/px · 14 of 33 slices shown]
[im 1/33]
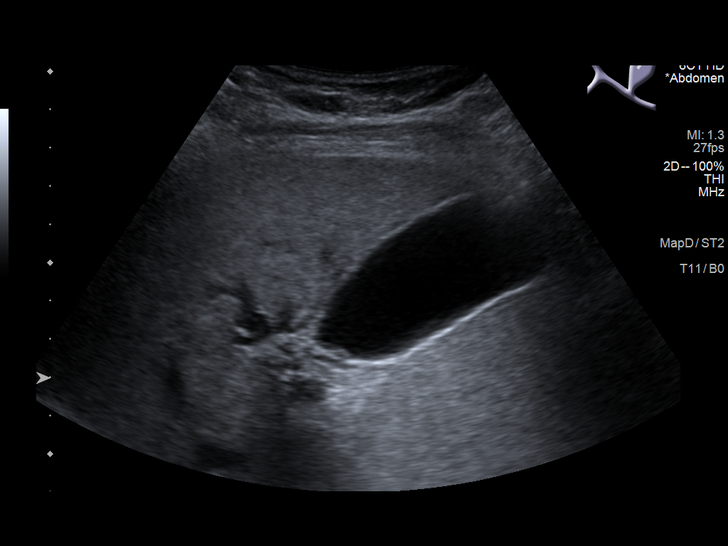
[im 3/33]
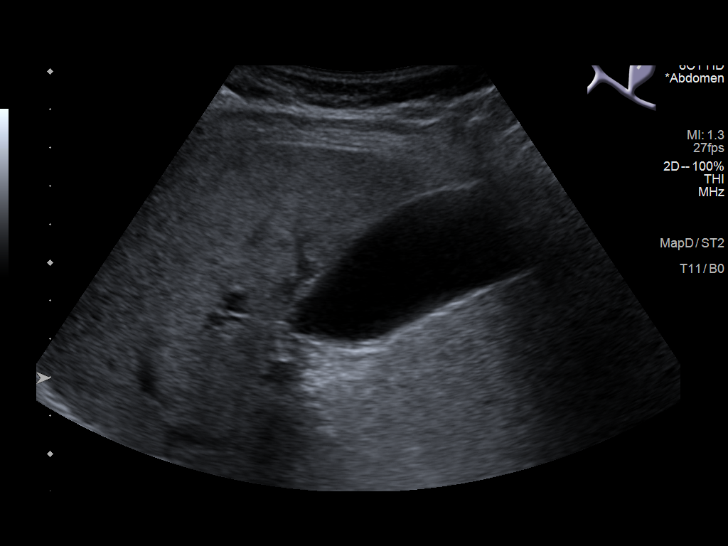
[im 6/33]
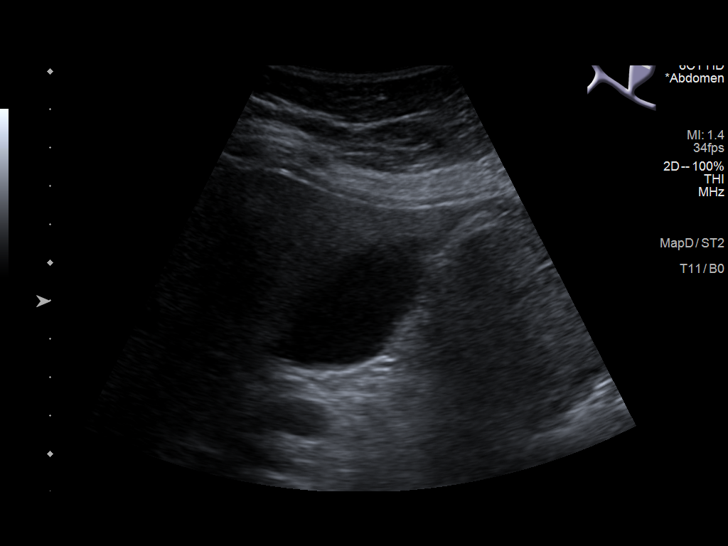
[im 9/33]
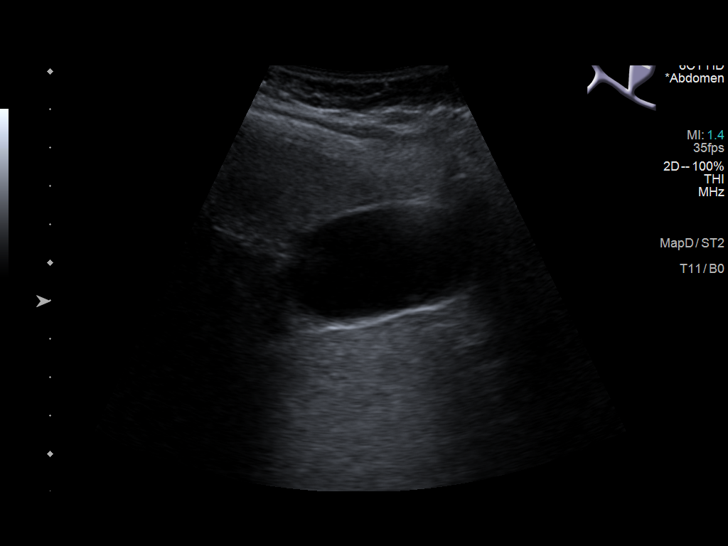
[im 11/33]
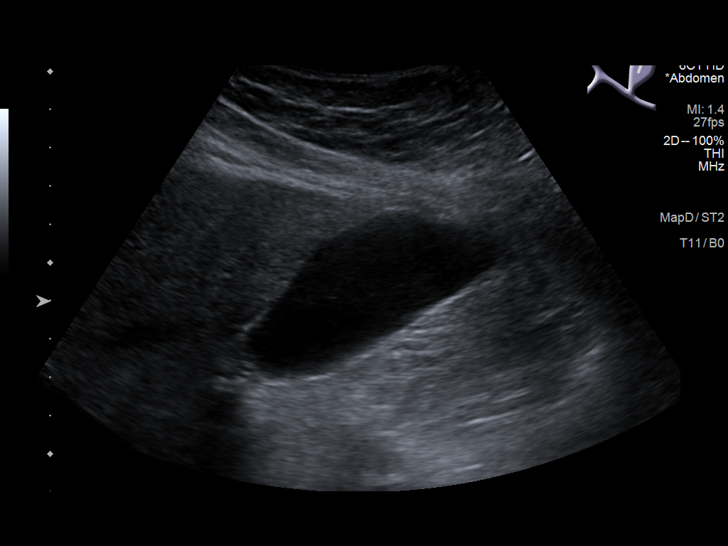
[im 13/33]
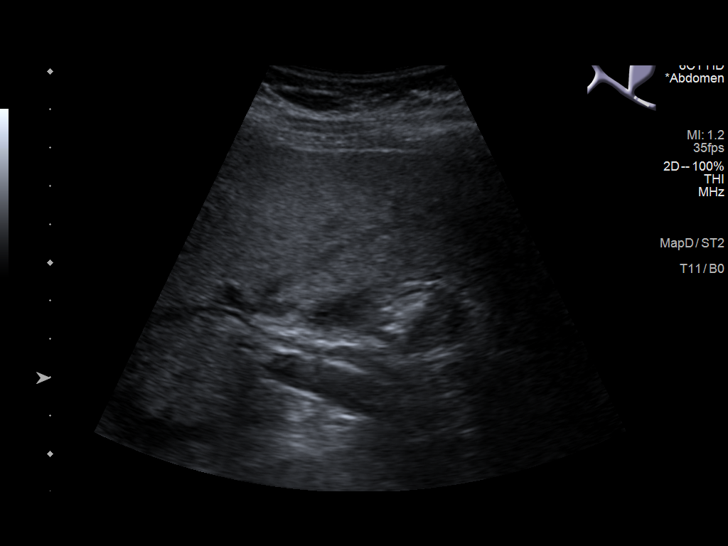
[im 15/33]
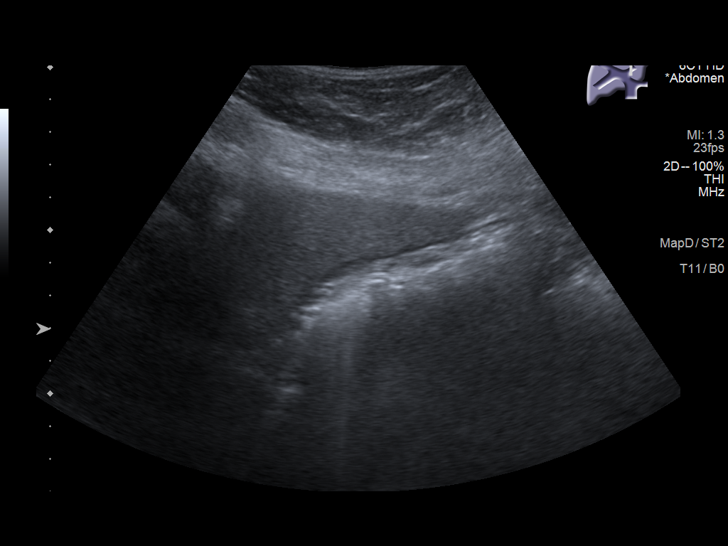
[im 18/33]
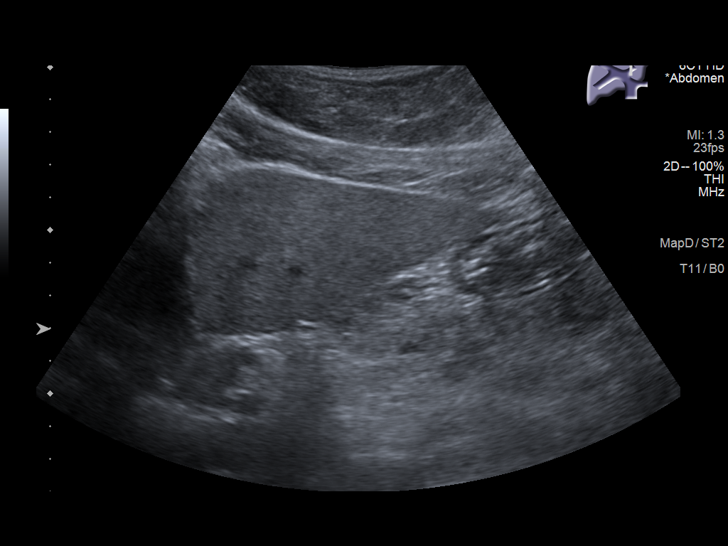
[im 21/33]
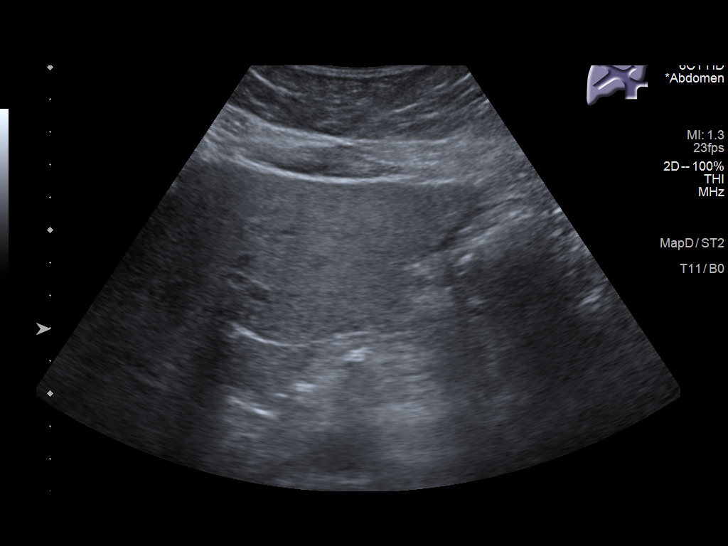
[im 22/33]
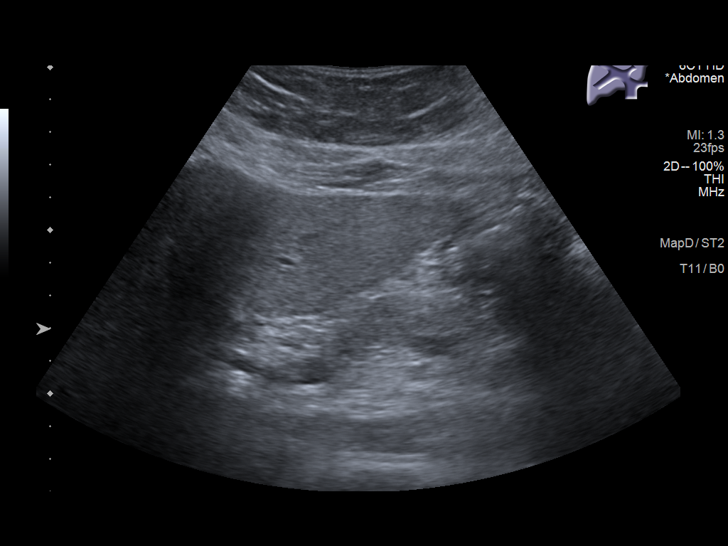
[im 25/33]
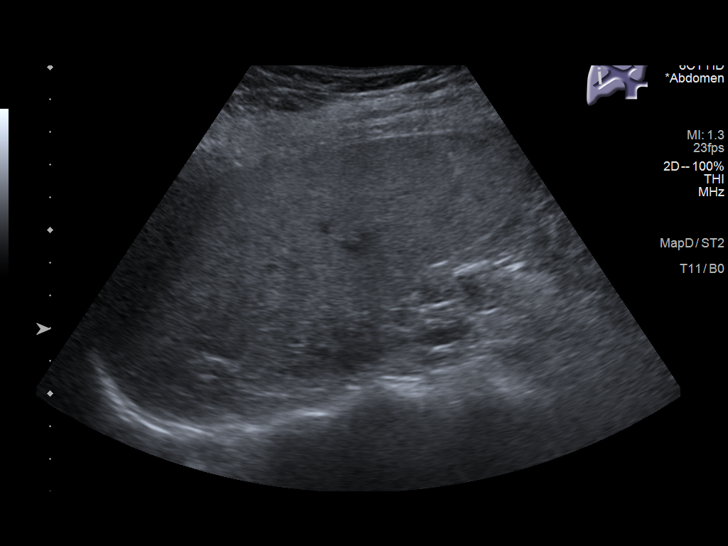
[im 27/33]
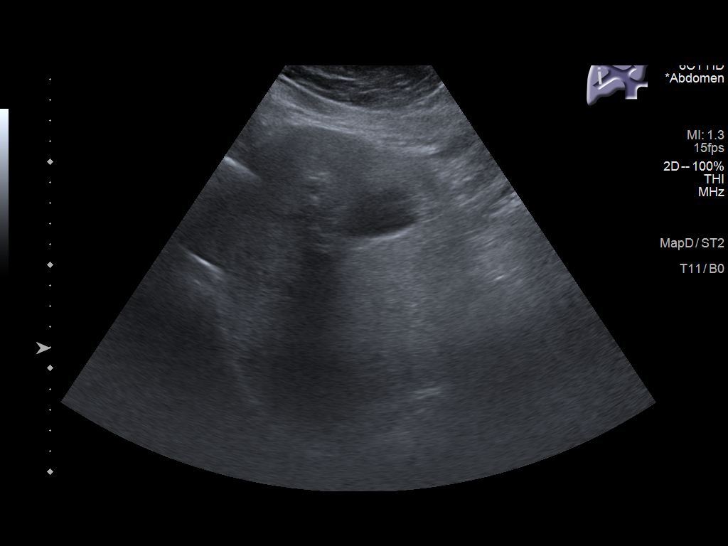
[im 30/33]
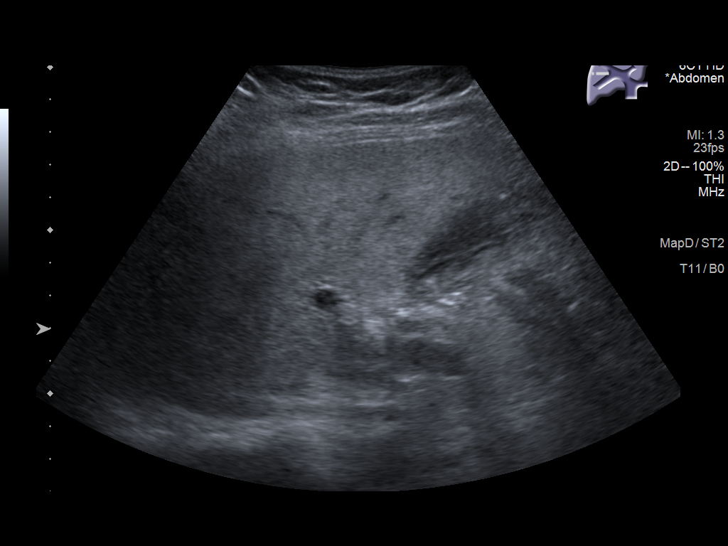
[im 33/33]
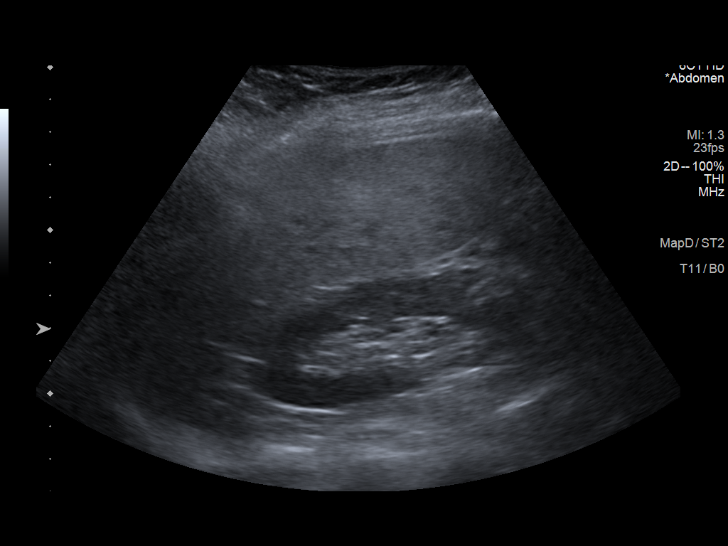

[14 of 25 positions shown; findings below may reference images not displayed]

FINDINGS: Gallbladder:

Normally distended without stones or wall thickening. No
pericholecystic fluid or sonographic Murphy sign.

Common bile duct:

Diameter: 4 mm, normal

Liver:

Echogenic parenchyma, likely fatty infiltration though this can be
seen with cirrhosis and certain infiltrative disorders. This was
also present on the previous study. No focal hepatic mass or
nodularity. Portal vein is patent on color Doppler imaging with
normal direction of blood flow towards the liver.

Other: No RIGHT upper quadrant free fluid
IMPRESSION: Probable fatty infiltration of liver as above.

Otherwise negative exam.

## 2021-05-02 ENCOUNTER — Telehealth: Payer: Self-pay | Admitting: Family Medicine

## 2021-05-02 NOTE — Telephone Encounter (Signed)
Please send in lisinopril 10mg  daily and HCTZ 12.5mg  daily.  This will be equivalent to her current combo pill.

## 2021-05-02 NOTE — Telephone Encounter (Signed)
Pt calling this am because she cannot get her quinapril-hydrochlorothiazide (ACCURETIC) 10-12.5 MG tablet  [facility] pharmacy advised this med is on recall/backorder and they are not able to get either.   Pharmacist advised dr needs to switch this med to another medication.  She said that is what most of their drs are doing.  Pt states she has 2 pills left.  She has too called all over town and no one has. Please advise.

## 2021-05-03 MED ORDER — LISINOPRIL 10 MG PO TABS: 10.0000 mg | ORAL_TABLET | Freq: Every day | ORAL | 1 refills | Status: DC

## 2021-05-03 MED ORDER — HYDROCHLOROTHIAZIDE 12.5 MG PO CAPS: 12.5000 mg | ORAL_CAPSULE | Freq: Every day | ORAL | 1 refills | Status: DC

## 2021-05-03 NOTE — Telephone Encounter (Signed)
Patient advised. Prescriptions sent into patients preferred pharmacy [facility]).

## 2021-06-13 NOTE — Progress Notes (Signed)
?  ? ?I,Alyssa Thornton,acting as a scribe for Lavon Paganini, MD.,have documented all relevant documentation on the behalf of Lavon Paganini, MD,as directed by  Lavon Paganini, MD while in the presence of Lavon Paganini, MD.  ? ?Established patient visit ? ? ?Patient: Alyssa Thornton   DOB: 09/26/1955   66 y.o. Female  MRN: [id number] ?Visit Date: 06/14/2021 ? ?Today's healthcare provider: Lavon Paganini, MD  ? ?Chief Complaint  ?Patient presents with  ? 6 months follow-up chronic disease  ? ?Subjective  ?  ?HPI  ?Hypertension, follow-up ? ?BP Readings from Last 3 Encounters:  ?06/14/21 129/80  ?02/22/21 123/78  ?12/26/20 100/60  ? Wt Readings from Last 3 Encounters:  ?06/14/21 151 lb 8 oz (68.7 kg)  ?02/22/21 150 lb (68 kg)  ?12/26/20 152 lb (68.9 kg)  ?  ? ?She was last seen for hypertension 6 months ago.  ?BP at that visit was 139/81. Management since that visit includes continue current medications. ? ?She reports excellent compliance with treatment. ?She is not having side effects.  ?She is following a Low Sodium diet. ? ?Outside blood pressures are 116's-130's/80's. ?Symptoms: ?No chest pain No chest pressure  ?No palpitations No syncope  ?No dyspnea No orthopnea  ?No paroxysmal nocturnal dyspnea No lower extremity edema  ? ?Pertinent labs: ?Lab Results  ?Component Value Date  ? CHOL 198 05/22/2020  ? HDL 73 05/22/2020  ? LDLCALC 112 (H) 05/22/2020  ? TRIG 69 05/22/2020  ? CHOLHDL 2.7 05/22/2020  ? Lab Results  ?Component Value Date  ? NA 142 11/30/2020  ? K 3.9 11/30/2020  ? CREATININE 0.66 11/30/2020  ? EGFR 96 11/30/2020  ? GLUCOSE 121 (H) 11/30/2020  ? TSH 3.140 05/21/2016  ?  ? ?The 10-year ASCVD risk score (Arnett DK, et al., 2019) is: 8.5%  ? ?---------------------------------------------------------------------------------------------------  ? ?Medications: ?Outpatient Medications Prior to Visit  ?Medication Sig  ? Cholecalciferol (VITAMIN D-3 PO) Take 1,000 Int'l Units by mouth in the  morning and at bedtime. 2 tabs daily with a meal  ? conjugated estrogens (PREMARIN) vaginal cream Insert 1 g vaginally nightly for 2 wks, then 1 g once weekly as maintenance  ? fluticasone (FLONASE) 50 MCG/ACT nasal spray Place 1 spray into both nostrils daily as needed.  ? montelukast (SINGULAIR) 10 MG tablet Take 1 tablet (10 mg total) by mouth daily.  ? vitamin C (ASCORBIC ACID) 500 MG tablet Take 500 mg by mouth daily.  ? [DISCONTINUED] hydrochlorothiazide (MICROZIDE) 12.5 MG capsule Take 1 capsule (12.5 mg total) by mouth daily.  ? [DISCONTINUED] lisinopril (ZESTRIL) 10 MG tablet Take 1 tablet (10 mg total) by mouth daily.  ? ?No facility-administered medications prior to visit.  ? ? ?Review of Systems per HPI ? ? ?  Objective  ?  ?BP 129/80 (BP Location: Left Arm, Patient Position: Sitting, Cuff Size: Normal)   Pulse 74   Temp 98 ?F (36.7 ?C) (Oral)   Resp 16   Ht $R'5\' 3"'RD$  (1.6 m)   Wt 151 lb 8 oz (68.7 kg)   BMI 26.84 kg/m?  ? ? ?Physical Exam ?Vitals reviewed.  ?Constitutional:   ?   General: She is not in acute distress. ?   Appearance: Normal appearance. She is well-developed. She is not diaphoretic.  ?HENT:  ?   Head: Normocephalic and atraumatic.  ?Eyes:  ?   General: No scleral icterus. ?   Conjunctiva/sclera: Conjunctivae normal.  ?Neck:  ?   Thyroid: No thyromegaly.  ?Cardiovascular:  ?  Rate and Rhythm: Normal rate and regular rhythm.  ?   Pulses: Normal pulses.  ?   Heart sounds: Normal heart sounds. No murmur heard. ?Pulmonary:  ?   Effort: Pulmonary effort is normal. No respiratory distress.  ?   Breath sounds: Normal breath sounds. No wheezing, rhonchi or rales.  ?Musculoskeletal:  ?   Cervical back: Neck supple.  ?   Right lower leg: No edema.  ?   Left lower leg: No edema.  ?Lymphadenopathy:  ?   Cervical: No cervical adenopathy.  ?Skin: ?   General: Skin is warm and dry.  ?   Findings: No rash.  ?Neurological:  ?   Mental Status: She is alert and oriented to person, place, and time. Mental  status is at baseline.  ?Psychiatric:     ?   Mood and Affect: Mood normal.     ?   Behavior: Behavior normal.  ?  ? ? ?No results found for any visits on 06/14/21. ? Assessment & Plan  ?  ? ?Problem List Items Addressed This Visit   ? ?  ? Cardiovascular and Mediastinum  ? Essential hypertension - Primary  ?  Well controlled ?Continue current medications - will switch to combo pill ?Recheck metabolic panel ?F/u in 6 months  ?  ?  ? Relevant Medications  ? lisinopril-hydrochlorothiazide (ZESTORETIC) 20-25 MG tablet  ? Other Relevant Orders  ? Comprehensive metabolic panel  ?  ? Hematopoietic and Hemostatic  ? Thrombocytopenia ([place])  ?  Mild, asymptomatic ?monitor CBC ?  ?  ? Relevant Orders  ? CBC w/Diff/Platelet  ?  ? Other  ? Hyperlipidemia  ?  Reviewed last lipid panel ?Not currently on a statin ?Recheck FLP and CMP ?Discussed diet and exercise  ?  ?  ? Relevant Medications  ? lisinopril-hydrochlorothiazide (ZESTORETIC) 20-25 MG tablet  ? Other Relevant Orders  ? Comprehensive metabolic panel  ? Lipid panel  ? Prediabetes  ?  Recommend low carb diet ?Recheck A1c ?  ?  ? Relevant Orders  ? Hemoglobin A1c  ? Overweight  ?  Discussed importance of healthy weight management ?Discussed diet and exercise  ?  ?  ?  ? ?Return in about 6 months (around 12/15/2021) for chronic disease f/u.  ?   ? ?I, Lavon Paganini, MD, have reviewed all documentation for this visit. The documentation on 06/14/21 for the exam, diagnosis, procedures, and orders are all accurate and complete. ? ? ?Virginia Crews, MD, MPH ?[place] ?[facility]   ?

## 2021-06-14 ENCOUNTER — Ambulatory Visit: Payer: Medicare Other | Admitting: Family Medicine

## 2021-06-14 ENCOUNTER — Other Ambulatory Visit: Payer: Self-pay

## 2021-06-14 ENCOUNTER — Telehealth: Payer: Self-pay

## 2021-06-14 ENCOUNTER — Encounter: Payer: Self-pay | Admitting: Family Medicine

## 2021-06-14 VITALS — BP 129/80 | HR 74 | Temp 98.0°F | Resp 16 | Ht 63.0 in | Wt 151.5 lb

## 2021-06-14 DIAGNOSIS — D696 Thrombocytopenia, unspecified: Secondary | ICD-10-CM

## 2021-06-14 DIAGNOSIS — I10 Essential (primary) hypertension: Secondary | ICD-10-CM

## 2021-06-14 DIAGNOSIS — E782 Mixed hyperlipidemia: Secondary | ICD-10-CM

## 2021-06-14 DIAGNOSIS — E663 Overweight: Secondary | ICD-10-CM

## 2021-06-14 DIAGNOSIS — R7303 Prediabetes: Secondary | ICD-10-CM

## 2021-06-14 MED ORDER — LISINOPRIL-HYDROCHLOROTHIAZIDE 20-25 MG PO TABS: 0.5000 | ORAL_TABLET | Freq: Every day | ORAL | 3 refills | Status: DC

## 2021-06-14 NOTE — Assessment & Plan Note (Signed)
Reviewed last lipid panel Not currently on a statin Recheck FLP and CMP Discussed diet and exercise  

## 2021-06-14 NOTE — Assessment & Plan Note (Signed)
Discussed importance of healthy weight management Discussed diet and exercise  

## 2021-06-14 NOTE — Telephone Encounter (Signed)
Alyssa Thornton. Well I did put a message to them to hold for patients next refill. Oh well. Sorry about that.

## 2021-06-14 NOTE — Assessment & Plan Note (Addendum)
Well controlled ?Continue current medications - will switch to combo pill ?Recheck metabolic panel ?F/u in 6 months  ?

## 2021-06-14 NOTE — Assessment & Plan Note (Signed)
Mild, asymptomatic ?monitor CBC ?

## 2021-06-14 NOTE — Telephone Encounter (Signed)
Copied from [place] [id number]. Topic: General - Inquiry ?>> Jun 14, 2021 11:49 AM Oneta Rack wrote: ?Reason for CRM: patient was seen today and wanted to let PCP know a funny  joke. Pharmacy reached out to patient to inform her that her lisinopril-hydrochlorothiazide (ZESTORETIC) 20-25 MG tablet was ready for pick up. Patient states she told pharmacy she is not ready and to please put back on shelf ?

## 2021-06-14 NOTE — Assessment & Plan Note (Signed)
Recommend low carb diet °Recheck A1c  °

## 2021-06-15 LAB — CBC WITH DIFFERENTIAL/PLATELET
Basophils Absolute: 0 10*3/uL (ref 0.0–0.2)
Basos: 1 %
EOS (ABSOLUTE): 0.1 10*3/uL (ref 0.0–0.4)
Eos: 2 %
Hematocrit: 43.3 % (ref 34.0–46.6)
Hemoglobin: 14.6 g/dL (ref 11.1–15.9)
Immature Grans (Abs): 0 10*3/uL (ref 0.0–0.1)
Immature Granulocytes: 0 %
Lymphocytes Absolute: 1.3 10*3/uL (ref 0.7–3.1)
Lymphs: 33 %
MCH: 30.7 pg (ref 26.6–33.0)
MCHC: 33.7 g/dL (ref 31.5–35.7)
MCV: 91 fL (ref 79–97)
Monocytes Absolute: 0.3 10*3/uL (ref 0.1–0.9)
Monocytes: 9 %
Neutrophils Absolute: 2.2 10*3/uL (ref 1.4–7.0)
Neutrophils: 55 %
Platelets: 151 10*3/uL (ref 150–450)
RBC: 4.75 x10E6/uL (ref 3.77–5.28)
RDW: 12.3 % (ref 11.7–15.4)
WBC: 3.9 10*3/uL (ref 3.4–10.8)

## 2021-06-15 LAB — COMPREHENSIVE METABOLIC PANEL
ALT: 28 IU/L (ref 0–32)
AST: 28 IU/L (ref 0–40)
Albumin/Globulin Ratio: 2.2 (ref 1.2–2.2)
Albumin: 4.6 g/dL (ref 3.8–4.8)
Alkaline Phosphatase: 117 IU/L (ref 44–121)
BUN/Creatinine Ratio: 20 (ref 12–28)
BUN: 13 mg/dL (ref 8–27)
Bilirubin Total: 1.2 mg/dL (ref 0.0–1.2)
CO2: 24 mmol/L (ref 20–29)
Calcium: 9.6 mg/dL (ref 8.7–10.3)
Chloride: 101 mmol/L (ref 96–106)
Creatinine, Ser: 0.66 mg/dL (ref 0.57–1.00)
Globulin, Total: 2.1 g/dL (ref 1.5–4.5)
Glucose: 114 mg/dL — ABNORMAL HIGH (ref 70–99)
Potassium: 4.1 mmol/L (ref 3.5–5.2)
Sodium: 141 mmol/L (ref 134–144)
Total Protein: 6.7 g/dL (ref 6.0–8.5)
eGFR: 96 mL/min/{1.73_m2} (ref >59)

## 2021-06-15 LAB — LIPID PANEL
Chol/HDL Ratio: 3 ratio (ref 0.0–4.4)
Cholesterol, Total: 191 mg/dL (ref 100–199)
HDL: 64 mg/dL (ref >39)
LDL Chol Calc (NIH): 109 mg/dL — ABNORMAL HIGH (ref 0–99)
Triglycerides: 100 mg/dL (ref 0–149)
VLDL Cholesterol Cal: 18 mg/dL (ref 5–40)

## 2021-06-15 LAB — HEMOGLOBIN A1C
Est. average glucose Bld gHb Est-mCnc: 120 mg/dL
Hgb A1c MFr Bld: 5.8 % — ABNORMAL HIGH (ref 4.8–5.6)

## 2021-08-22 DIAGNOSIS — H2513 Age-related nuclear cataract, bilateral: Secondary | ICD-10-CM | POA: Diagnosis not present

## 2021-08-27 ENCOUNTER — Other Ambulatory Visit: Payer: Self-pay | Admitting: Family Medicine

## 2021-08-27 DIAGNOSIS — J302 Other seasonal allergic rhinitis: Secondary | ICD-10-CM

## 2021-10-24 ENCOUNTER — Other Ambulatory Visit: Payer: Self-pay | Admitting: Family Medicine

## 2021-11-30 ENCOUNTER — Encounter: Payer: Self-pay | Admitting: Family Medicine

## 2021-12-17 ENCOUNTER — Ambulatory Visit: Payer: Federal, State, Local not specified - PPO | Admitting: Family Medicine

## 2022-01-14 NOTE — Progress Notes (Unsigned)
I,Gable Odonohue S Telma Pyeatt,acting as a Education administrator for Lavon Paganini, MD.,have documented all relevant documentation on the behalf of Lavon Paganini, MD,as directed by  Lavon Paganini, MD while in the presence of Lavon Paganini, MD.     Established patient visit   Patient: Alyssa Thornton   DOB: October 06, 1955   66 y.o. Female  MRN: [id number] Visit Date: 01/15/2022  Today's healthcare provider: Lavon Paganini, MD   No chief complaint on file.  Subjective    HPI  Hypertension, follow-up  BP Readings from Last 3 Encounters:  06/14/21 129/80  02/22/21 123/78  12/26/20 100/60   Wt Readings from Last 3 Encounters:  06/14/21 151 lb 8 oz (68.7 kg)  02/22/21 150 lb (68 kg)  12/26/20 152 lb (68.9 kg)     She was last seen for hypertension 7 months ago.  BP at that visit was 129/80. Management since that visit includes switch to combo pill.  She reports {excellent/good/fair/poor:19665} compliance with treatment. She {is/is not:9024} having side effects. {document side effects if present:1} She is following a {diet:21022986} diet. She {is/is not:9024} exercising. She does not smoke.  Use of agents associated with hypertension: none.   Outside blood pressures are {***enter patient reported home BP readings, or 'not being checked':1}. Symptoms: {Yes/No:20286} chest pain {Yes/No:20286} chest pressure  {Yes/No:20286} palpitations {Yes/No:20286} syncope  {Yes/No:20286} dyspnea {Yes/No:20286} orthopnea  {Yes/No:20286} paroxysmal nocturnal dyspnea {Yes/No:20286} lower extremity edema   Pertinent labs Lab Results  Component Value Date   CHOL 191 06/14/2021   HDL 64 06/14/2021   LDLCALC 109 (H) 06/14/2021   TRIG 100 06/14/2021   CHOLHDL 3.0 06/14/2021   Lab Results  Component Value Date   NA 141 06/14/2021   K 4.1 06/14/2021   CREATININE 0.66 06/14/2021   EGFR 96 06/14/2021   GLUCOSE 114 (H) 06/14/2021   TSH 3.140 05/21/2016     The 10-year ASCVD risk score (Arnett DK,  et al., 2019) is: 9.8%  ---------------------------------------------------------------------------------------------------  Lipid/Cholesterol, Follow-up  Last lipid panel Other pertinent labs  Lab Results  Component Value Date   CHOL 191 06/14/2021   HDL 64 06/14/2021   LDLCALC 109 (H) 06/14/2021   TRIG 100 06/14/2021   CHOLHDL 3.0 06/14/2021   Lab Results  Component Value Date   ALT 28 06/14/2021   AST 28 06/14/2021   PLT 151 06/14/2021   TSH 3.140 05/21/2016     She was last seen for this 7 months ago.  Management since that visit includes no changes.  She reports {excellent/good/fair/poor:19665} compliance with treatment. She {is/is not:9024} having side effects. {document side effects if present:1}   The 10-year ASCVD risk score (Arnett DK, et al., 2019) is: 9.8%  --------------------------------------------------------------------------------------------------- Prediabetes, Follow-up  Lab Results  Component Value Date   HGBA1C 5.8 (H) 06/14/2021   HGBA1C 5.7 (H) 11/30/2020   HGBA1C 5.5 05/22/2020   GLUCOSE 114 (H) 06/14/2021   GLUCOSE 121 (H) 11/30/2020   GLUCOSE 113 (H) 05/22/2020    Last seen for for this7 months ago.  Management since that visit includes no changes. Current symptoms include {Symptoms; diabetes:14075} and have been {Desc; course:15616}.  Pertinent Labs:    Component Value Date/Time   CHOL 191 06/14/2021 0838   TRIG 100 06/14/2021 0838   CHOLHDL 3.0 06/14/2021 0838   CHOLHDL 2.5 12/17/2016 0843   CREATININE 0.66 06/14/2021 0838   CREATININE 0.59 12/17/2016 0843    Wt Readings from Last 3 Encounters:  06/14/21 151 lb 8 oz (68.7 kg)  02/22/21 150 lb (  68 kg)  12/26/20 152 lb (68.9 kg)    -----------------------------------------------------------------------------------------  Medications: Outpatient Medications Prior to Visit  Medication Sig   Cholecalciferol (VITAMIN D-3 PO) Take 1,000 Int'l Units by mouth in the morning and  at bedtime. 2 tabs daily with a meal   conjugated estrogens (PREMARIN) vaginal cream Insert 1 g vaginally nightly for 2 wks, then 1 g once weekly as maintenance   fluticasone (FLONASE) 50 MCG/ACT nasal spray SHAKE LIQUID AND USE 1 SPRAY IN EACH NOSTRIL DAILY AS NEEDED   lisinopril-hydrochlorothiazide (ZESTORETIC) 20-25 MG tablet Take 0.5 tablets by mouth daily.   montelukast (SINGULAIR) 10 MG tablet Take 1 tablet (10 mg total) by mouth daily.   vitamin C (ASCORBIC ACID) 500 MG tablet Take 500 mg by mouth daily.   No facility-administered medications prior to visit.    Review of Systems  Constitutional:  Negative for chills, fatigue and fever.  HENT:  Negative for ear pain, sinus pressure, sinus pain and sore throat.   Eyes:  Negative for pain and visual disturbance.  Respiratory:  Negative for cough, chest tightness, shortness of breath and wheezing.   Cardiovascular:  Negative for chest pain, palpitations and leg swelling.  Gastrointestinal:  Negative for abdominal pain, blood in stool, diarrhea, nausea and vomiting.  Genitourinary:  Negative for flank pain, frequency, pelvic pain and urgency.  Musculoskeletal:  Negative for back pain, myalgias and neck pain.  Neurological:  Negative for dizziness, weakness, light-headedness, numbness and headaches.    {Labs  Heme  Chem  Endocrine  Serology  Results Review (optional):23779}   Objective    There were no vitals taken for this visit. {Show previous vital signs (optional):23777}  Physical Exam  ***  No results found for any visits on 01/15/22.  Assessment & Plan     ***  No follow-ups on file.      {provider attestation***:1}   Lavon Paganini, MD  [facility] [phone] (phone) [phone] (fax)  [place]

## 2022-01-15 ENCOUNTER — Encounter: Payer: Self-pay | Admitting: Family Medicine

## 2022-01-15 ENCOUNTER — Ambulatory Visit: Payer: Medicare Other | Admitting: Family Medicine

## 2022-01-15 VITALS — BP 127/82 | HR 88 | Temp 98.0°F | Resp 16 | Wt 148.5 lb

## 2022-01-15 DIAGNOSIS — H8110 Benign paroxysmal vertigo, unspecified ear: Secondary | ICD-10-CM

## 2022-01-15 DIAGNOSIS — Z23 Encounter for immunization: Secondary | ICD-10-CM | POA: Diagnosis not present

## 2022-01-15 DIAGNOSIS — I10 Essential (primary) hypertension: Secondary | ICD-10-CM | POA: Diagnosis not present

## 2022-01-15 DIAGNOSIS — E782 Mixed hyperlipidemia: Secondary | ICD-10-CM

## 2022-01-15 DIAGNOSIS — E663 Overweight: Secondary | ICD-10-CM

## 2022-01-15 DIAGNOSIS — D696 Thrombocytopenia, unspecified: Secondary | ICD-10-CM | POA: Diagnosis not present

## 2022-01-15 DIAGNOSIS — R7303 Prediabetes: Secondary | ICD-10-CM | POA: Diagnosis not present

## 2022-01-15 NOTE — Assessment & Plan Note (Signed)
Well controlled Continue current medications Recheck metabolic panel F/u in 6 months  

## 2022-01-15 NOTE — Assessment & Plan Note (Signed)
Recommend low carb diet °Recheck A1c  °

## 2022-01-15 NOTE — Assessment & Plan Note (Signed)
Reviewed last lipid panel Not currently on a statin Recheck FLP and CMP annually - at next visit Discussed diet and exercise

## 2022-01-15 NOTE — Assessment & Plan Note (Signed)
Normal on last CBC Continue to monitor CBC annually

## 2022-01-15 NOTE — Assessment & Plan Note (Signed)
Discussed importance of healthy weight management Discussed diet and exercise  

## 2022-01-16 LAB — BASIC METABOLIC PANEL
BUN/Creatinine Ratio: 22 (ref 12–28)
BUN: 14 mg/dL (ref 8–27)
CO2: 20 mmol/L (ref 20–29)
Calcium: 9.4 mg/dL (ref 8.7–10.3)
Chloride: 102 mmol/L (ref 96–106)
Creatinine, Ser: 0.65 mg/dL (ref 0.57–1.00)
Glucose: 122 mg/dL — ABNORMAL HIGH (ref 70–99)
Potassium: 3.9 mmol/L (ref 3.5–5.2)
Sodium: 141 mmol/L (ref 134–144)
eGFR: 96 mL/min/{1.73_m2} (ref >59)

## 2022-01-16 LAB — HEMOGLOBIN A1C
Est. average glucose Bld gHb Est-mCnc: 117 mg/dL
Hgb A1c MFr Bld: 5.7 % — ABNORMAL HIGH (ref 4.8–5.6)

## 2022-01-24 ENCOUNTER — Ambulatory Visit: Payer: Self-pay

## 2022-01-24 ENCOUNTER — Telehealth: Payer: Medicare Other | Admitting: Family

## 2022-01-24 DIAGNOSIS — R1030 Lower abdominal pain, unspecified: Secondary | ICD-10-CM

## 2022-01-24 NOTE — Progress Notes (Signed)
Virtual Visit Consent   Alyssa Thornton, you are scheduled for a virtual visit with a [facility] provider today. Just as with appointments in the office, your consent must be obtained to participate. Your consent will be active for this visit and any virtual visit you may have with one of our providers in the next 365 days. If you have a MyChart account, a copy of this consent can be sent to you electronically.  As this is a virtual visit, video technology does not allow for your provider to perform a traditional examination. This may limit your provider's ability to fully assess your condition. If your provider identifies any concerns that need to be evaluated in person or the need to arrange testing (such as labs, EKG, etc.), we will make arrangements to do so. Although advances in technology are sophisticated, we cannot ensure that it will always work on either your end or our end. If the connection with a video visit is poor, the visit may have to be switched to a telephone visit. With either a video or telephone visit, we are not always able to ensure that we have a secure connection.  By engaging in this virtual visit, you consent to the provision of healthcare and authorize for your insurance to be billed (if applicable) for the services provided during this visit. Depending on your insurance coverage, you may receive a charge related to this service.  I need to obtain your verbal consent now. Are you willing to proceed with your visit today? Alyssa Thornton has provided verbal consent on 01/24/2022 for a virtual visit (video or telephone). Evelina Dun, FNP  Date: 01/24/2022 5:37 PM  Virtual Visit via Video Note   I, Evelina Dun, connected with  Alyssa Thornton  ([id number], 01/05/1956) on 01/24/22 at  5:15 PM EDT by a video-enabled telemedicine application and verified that I am speaking with the correct person using two identifiers.  Location: Patient: Virtual Visit Location Patient:  Home Provider: Virtual Visit Location Provider: Home Office   I discussed the limitations of evaluation and management by telemedicine and the availability of in person appointments. The patient expressed understanding and agreed to proceed.    History of Present Illness: Alyssa Thornton is a 66 y.o. who identifies as a female who was assigned female at birth, and is being seen today for reports she had pneumonia and shingles vaccines  on 01/15/22. She reports since then she is having abdominal cramps.   HPI: Abdominal Pain This is a new problem. The current episode started 1 to 4 weeks ago. The onset quality is gradual. The problem occurs intermittently. The pain is located in the RLQ and LLQ. The pain is moderate. The quality of the pain is cramping. The abdominal pain does not radiate. Pertinent negatives include no arthralgias, belching, constipation, dysuria, fever, flatus, hematuria, nausea or vomiting. Nothing aggravates the pain. The pain is relieved by Bowel movements. She has tried nothing for the symptoms. The treatment provided mild relief.    Problems:  Patient Active Problem List   Diagnosis Date Noted   Benign paroxysmal positional vertigo 01/15/2022   Dyspareunia in female 12/26/2020   Postmenopausal atrophic vaginitis 12/26/2020   Thrombocytopenia ([place]) 05/26/2019   Family history of ovarian cancer 06/16/2018   Prediabetes 05/05/2018   Overweight 05/05/2018   Abnormal mammogram of left breast 02/17/2017   Bilateral carpal tunnel syndrome 12/17/2016   Allergic rhinitis 03/31/2015   Hyperlipidemia 03/31/2015   Fatty infiltration of liver  03/31/2015   Essential hypertension 03/31/2015   Headache, migraine 03/31/2015   Plantar fasciitis 03/31/2015   Atypical hyperplasia of left breast 07/10/2013    Allergies:  Allergies  Allergen Reactions   Levofloxacin Nausea And Vomiting    Other reaction(s): Vomiting   Diclofenac Sodium Diarrhea   Tramadol Diarrhea    Medications:  Current Outpatient Medications:    Cholecalciferol (VITAMIN D-3 PO), Take 1,000 Int'l Units by mouth in the morning and at bedtime. 2 tabs daily with a meal, Disp: , Rfl:    fluticasone (FLONASE) 50 MCG/ACT nasal spray, SHAKE LIQUID AND USE 1 SPRAY IN EACH NOSTRIL DAILY AS NEEDED, Disp: 16 g, Rfl: 12   lisinopril-hydrochlorothiazide (ZESTORETIC) 20-25 MG tablet, Take 0.5 tablets by mouth daily., Disp: 45 tablet, Rfl: 3   montelukast (SINGULAIR) 10 MG tablet, Take 1 tablet (10 mg total) by mouth daily., Disp: 90 tablet, Rfl: 3   vitamin C (ASCORBIC ACID) 500 MG tablet, Take 500 mg by mouth daily., Disp: , Rfl:   Observations/Objective: Patient is well-developed, well-nourished in no acute distress.  Resting comfortably  at home.  Head is normocephalic, atraumatic.  No labored breathing.  Speech is clear and coherent with logical content.  Patient is alert and oriented at baseline.  Mild lower abdominal pain with palpation   Assessment and Plan: 1. Lower abdominal pain  Discussed in length that this is not been caused from vaccines.  She will call PCP office in AM or go to Urgent Care. No fevers at his time and pain is mild.  Worrisome for diverticulitis  Force fluids Increase fiber  Avoid drinks and food with red dyes  Follow Up Instructions: I discussed the assessment and treatment plan with the patient. The patient was provided an opportunity to ask questions and all were answered. The patient agreed with the plan and demonstrated an understanding of the instructions.  A copy of instructions were sent to the patient via MyChart unless otherwise noted below.     The patient was advised to call back or seek an in-person evaluation if the symptoms worsen or if the condition fails to improve as anticipated.  Time:  I spent 10 minutes with the patient via telehealth technology discussing the above problems/concerns.    Evelina Dun, FNP

## 2022-01-24 NOTE — Telephone Encounter (Signed)
  Chief Complaint: Stomach cramps.  Symptoms: Stomach cramps  Frequency: Since vaccines last week. Pertinent Negatives: Patient denies  Disposition: '[]'$ ED /'[]'$ Urgent Care (no appt availability in office) / '[]'$ Appointment(In office/virtual)/ '[x]'$  [facility]/ '[]'$ Home Care/ '[]'$ Refused Recommended Disposition /'[]'$ [facility]/ '[]'$  Follow-up with PCP Additional Notes: PT has not felt well since receiving 2 vaccines on the 10th. PT passed out in the bathroom at 3 am after the vaccines. Pt now continues with abdominal cramps.  Cramps are quite painful.   Reason for Disposition  [1] MODERATE pain (e.g., interferes with normal activities) AND [2] pain comes and goes (cramps) AND [3] present > 24 hours  (Exception: Pain with Vomiting or Diarrhea - see that Guideline.)  Answer Assessment - Initial Assessment Questions 1. LOCATION: "Where does it hurt?"      Abdomen 2. RADIATION: "Does the pain shoot anywhere else?" (e.g., chest, back)     *No Answer* 3. ONSET: "When did the pain begin?" (e.g., minutes, hours or days ago)      01/15/2022 4. SUDDEN: "Gradual or sudden onset?"     Gradual 5. PATTERN "Does the pain come and go, or is it constant?"    - If it comes and goes: "How long does it last?" "Do you have pain now?"     (Note: Comes and goes means the pain is intermittent. It goes away completely between bouts.)    - If constant: "Is it getting better, staying the same, or getting worse?"      (Note: Constant means the pain never goes away completely; most serious pain is constant and gets worse.)      *No Answer* 6. SEVERITY: "How bad is the pain?"  (e.g., Scale 1-10; mild, moderate, or severe)    - MILD (1-3): Doesn't interfere with normal activities, abdomen soft and not tender to touch.     - MODERATE (4-7): Interferes with normal activities or awakens from sleep, abdomen tender to touch.     - SEVERE (8-10): Excruciating pain, doubled over, unable to do any normal activities.        *No Answer* 7. RECURRENT SYMPTOM: "Have you ever had this type of stomach pain before?" If Yes, ask: "When was the last time?" and "What happened that time?"      *No Answer* 8. CAUSE: "What do you think is causing the stomach pain?"     *No Answer* 9. RELIEVING/AGGRAVATING FACTORS: "What makes it better or worse?" (e.g., antacids, bending or twisting motion, bowel movement)     *No Answer* 10. OTHER SYMPTOMS: "Do you have any other symptoms?" (e.g., back pain, diarrhea, fever, urination pain, vomiting)       *No Answer* 11. PREGNANCY: "Is there any chance you are pregnant?" "When was your last menstrual period?"       *No Answer*  Protocols used: Abdominal Pain - [facility]

## 2022-01-24 NOTE — Telephone Encounter (Signed)
Patient scheduled for a Virtual health appointment at 5:15 pm today

## 2022-01-25 ENCOUNTER — Encounter: Payer: Self-pay | Admitting: Physician Assistant

## 2022-01-25 ENCOUNTER — Ambulatory Visit (INDEPENDENT_AMBULATORY_CARE_PROVIDER_SITE_OTHER): Payer: Medicare Other | Admitting: Physician Assistant

## 2022-01-25 ENCOUNTER — Telehealth: Payer: Self-pay

## 2022-01-25 VITALS — BP 111/71 | HR 87 | Ht 62.0 in | Wt 143.6 lb

## 2022-01-25 DIAGNOSIS — R8271 Bacteriuria: Secondary | ICD-10-CM

## 2022-01-25 DIAGNOSIS — R1032 Left lower quadrant pain: Secondary | ICD-10-CM | POA: Diagnosis not present

## 2022-01-25 LAB — POCT URINALYSIS DIPSTICK
Bilirubin, UA: NEGATIVE
Glucose, UA: NEGATIVE
Ketones, UA: NEGATIVE
Nitrite, UA: NEGATIVE
Protein, UA: NEGATIVE
Spec Grav, UA: >=1.03 — AB (ref 1.010–1.025)
Urobilinogen, UA: 0.2 E.U./dL
pH, UA: 6 (ref 5.0–8.0)

## 2022-01-25 MED ORDER — SULFAMETHOXAZOLE-TRIMETHOPRIM 800-160 MG PO TABS: 1.0000 | ORAL_TABLET | Freq: Two times a day (BID) | ORAL | 0 refills | Status: AC

## 2022-01-25 NOTE — Assessment & Plan Note (Addendum)
UA today + leuk and blood Sent for culture Will send bactrim ds pobid x 5 days -- advised there is a chance the culture is negative, but would rather treat as we are going into the weekend and won't know results until Monday  If symptoms dont improve, please call office for further recommendations

## 2022-01-25 NOTE — Progress Notes (Signed)
I,Sha'taria Tyson,acting as a Education administrator for [employer], PA-C.,have documented all relevant documentation on the behalf of Alyssa Kirschner, PA-C,as directed by  Alyssa Kirschner, PA-C while in the presence of Alyssa Kirschner, PA-C.   Established patient visit   Patient: Alyssa Thornton   DOB: 18-Nov-1955   66 y.o. Female  MRN: [id number] Visit Date: 01/25/2022  Today's healthcare provider: [name], PA-C   Cc. Abdominal pain x 5-7 days  Subjective    Alyssa Thornton is a 66 y/o female who reports lower abdominal pain that started around 10/12,  Patient reports pain started after getting her 2nd dose of pneumonia and shingles vaccine on 01/15/22. She describes the pain as a cramping, and holding her stomach up helps ease the pain. Feels a small amount of relief after a BM. Denies any changes in BM, denies blood, diarrhea, constipation, bloating. Denies any dysuria, frequency, hematuria. Denies any back pain. Denies any pain w/ movement of hip on either side. Denies history of similar symptoms. Denies vaginal bleeding, discharge. Denies N/V. Pt has retained uterus and ovaries.  Medications: Outpatient Medications Prior to Visit  Medication Sig   Cholecalciferol (VITAMIN D-3 PO) Take 1,000 Int'l Units by mouth in the morning and at bedtime. 2 tabs daily with a meal   fluticasone (FLONASE) 50 MCG/ACT nasal spray SHAKE LIQUID AND USE 1 SPRAY IN EACH NOSTRIL DAILY AS NEEDED   lisinopril-hydrochlorothiazide (ZESTORETIC) 20-25 MG tablet Take 0.5 tablets by mouth daily.   montelukast (SINGULAIR) 10 MG tablet Take 1 tablet (10 mg total) by mouth daily.   vitamin C (ASCORBIC ACID) 500 MG tablet Take 500 mg by mouth daily.   No facility-administered medications prior to visit.    Review of Systems  Gastrointestinal:  Positive for abdominal pain.     Objective    Blood pressure 111/71, pulse 87, height '5\' 2"'$  (1.575 m), weight 143 lb 9.6 oz (65.1 kg), SpO2 100 %.   Physical Exam Vitals reviewed.   Constitutional:      Appearance: She is not ill-appearing.  HENT:     Head: Normocephalic.  Eyes:     Conjunctiva/sclera: Conjunctivae normal.  Cardiovascular:     Rate and Rhythm: Normal rate.  Pulmonary:     Effort: Pulmonary effort is normal. No respiratory distress.  Abdominal:     General: Abdomen is flat.     Palpations: Abdomen is soft.     Tenderness: There is no abdominal tenderness. There is no guarding or rebound.  Neurological:     General: No focal deficit present.     Mental Status: She is alert and oriented to person, place, and time.  Psychiatric:        Mood and Affect: Mood normal.        Behavior: Behavior normal.     Results for orders placed or performed in visit on 01/25/22  POCT Urinalysis Dipstick  Result Value Ref Range   Color, UA     Clarity, UA     Glucose, UA Negative Negative   Bilirubin, UA negative    Ketones, UA negative    Spec Grav, UA >=1.030 (A) 1.010 - 1.025   Blood, UA trace    pH, UA 6.0 5.0 - 8.0   Protein, UA Negative Negative   Urobilinogen, UA 0.2 0.2 or 1.0 E.U./dL   Nitrite, UA negative    Leukocytes, UA Moderate (2+) (A) Negative   Appearance     Odor      Assessment &  Plan     Problem List Items Addressed This Visit       Other   Left lower quadrant abdominal pain - Primary    UA today + leuk and blood Sent for culture Will send bactrim ds pobid x 5 days -- advised there is a chance the culture is negative, but would rather treat as we are going into the weekend and won't know results until Monday  If symptoms dont improve, please call office for further recommendations      Relevant Medications   sulfamethoxazole-trimethoprim (BACTRIM DS) 800-160 MG tablet   Other Relevant Orders   POCT Urinalysis Dipstick (Completed)   Urine Culture   Other Visit Diagnoses     Bacteriuria       Relevant Medications   sulfamethoxazole-trimethoprim (BACTRIM DS) 800-160 MG tablet   Other Relevant Orders   Urine Culture        Return if symptoms worsen or fail to improve.     I, Alyssa Kirschner, PA-C have reviewed all documentation for this visit. The documentation on  01/25/2022 for the exam, diagnosis, procedures, and orders are all accurate and complete.  Alyssa Kirschner, PA-C [facility] [address] [place], [place], [address] Office: [phone] Fax: [place]

## 2022-01-25 NOTE — Telephone Encounter (Signed)
Appt today

## 2022-01-25 NOTE — Telephone Encounter (Signed)
Patient called and stated that she had a virtual visit with [facility] yesterday.  Per the patient they advised her that she may have diverticulitis.  They recommended she call her primary and see about getting this ordered.  Patient understands that you may actually want to see her first.  She has asked for a call back to let her know what you would like done.  She will be at the hospital most of the day with her husband and would like the call back on her cell number that is on file.

## 2022-01-25 NOTE — Telephone Encounter (Signed)
Can she come over now? Quentin Cornwall could see her before her 10 am patient.

## 2022-01-28 LAB — URINE CULTURE

## 2022-02-08 NOTE — Progress Notes (Unsigned)
I,Jahniah Pallas S Carlyon Nolasco,acting as a Education administrator for Lavon Paganini, MD.,have documented all relevant documentation on the behalf of Lavon Paganini, MD,as directed by  Lavon Paganini, MD while in the presence of Lavon Paganini, MD.     Established patient visit   Patient: Alyssa Thornton   DOB: 1955/06/22   66 y.o. Female  MRN: [id number] Visit Date: 02/11/2022  Today's healthcare provider: Lavon Paganini, MD   Chief Complaint  Patient presents with   Follow-up   Subjective    HPI  Follow up for left lower quadrant abdominal pain  The patient was last seen for this 2 weeks ago. Changes made at last visit include start Bactrin DS BID. Urine culture came back negative.  She reports excellent compliance with treatment. She feels that condition is Improved. She is not having side effects.   -----------------------------------------------------------------------------------------   Medications: Outpatient Medications Prior to Visit  Medication Sig   Cholecalciferol (VITAMIN D-3 PO) Take 1,000 Int'l Units by mouth in the morning and at bedtime. 2 tabs daily with a meal   fluticasone (FLONASE) 50 MCG/ACT nasal spray SHAKE LIQUID AND USE 1 SPRAY IN EACH NOSTRIL DAILY AS NEEDED   lisinopril-hydrochlorothiazide (ZESTORETIC) 20-25 MG tablet Take 0.5 tablets by mouth daily.   montelukast (SINGULAIR) 10 MG tablet Take 1 tablet (10 mg total) by mouth daily.   vitamin C (ASCORBIC ACID) 500 MG tablet Take 500 mg by mouth daily.   No facility-administered medications prior to visit.    Review of Systems  Constitutional:  Negative for appetite change and fatigue.  Respiratory:  Negative for cough, chest tightness and shortness of breath.   Cardiovascular:  Negative for chest pain and palpitations.  Gastrointestinal:  Positive for abdominal pain. Negative for abdominal distention, blood in stool, constipation, diarrhea, nausea and vomiting.  Genitourinary:  Negative for dysuria and  hematuria.       Objective    BP 106/65 (BP Location: Left Arm, Patient Position: Sitting, Cuff Size: Large)   Pulse 77   Temp 98.6 F (37 C) (Oral)   Wt 146 lb 4.8 oz (66.4 kg)   BMI 26.76 kg/m    Physical Exam Vitals reviewed.  Constitutional:      General: She is not in acute distress.    Appearance: Normal appearance. She is well-developed. She is not diaphoretic.  HENT:     Head: Normocephalic and atraumatic.  Eyes:     General: No scleral icterus.    Conjunctiva/sclera: Conjunctivae normal.  Neck:     Thyroid: No thyromegaly.  Cardiovascular:     Rate and Rhythm: Normal rate and regular rhythm.  Pulmonary:     Effort: Pulmonary effort is normal.  Abdominal:     General: There is no distension.     Palpations: Abdomen is soft.     Tenderness: There is no abdominal tenderness. There is no guarding.  Musculoskeletal:     Cervical back: Neck supple.     Right lower leg: No edema.     Left lower leg: No edema.  Lymphadenopathy:     Cervical: No cervical adenopathy.  Skin:    General: Skin is warm and dry.  Neurological:     Mental Status: She is alert and oriented to person, place, and time. Mental status is at baseline.  Psychiatric:        Mood and Affect: Mood normal.        Behavior: Behavior normal.       No results found for any  visits on 02/11/22.  Assessment & Plan     Problem List Items Addressed This Visit       Other   Diverticulitis - Primary    Patient with LLQ pain, change in bowel habits and malaise/pre-syncope Suspect diverticulitis She was treated last week for possible UTI, but urine culture had no growth Her pain did improve while she was on the Bactrim, but has increased again since stopping Suspect that she has partially/undertreated diverticulitis Known diverticulosis on last colonoscopy She is afebrile and stable and okay for outpatient treatment We will treat with Augmentin x10 days If no improvement or any worsening, we  will get CT abdomen pelvis        Return if symptoms worsen or fail to improve.      I, Lavon Paganini, MD, have reviewed all documentation for this visit. The documentation on 02/11/22 for the exam, diagnosis, procedures, and orders are all accurate and complete.   Bacigalupo, Dionne Bucy, MD, MPH [facility]

## 2022-02-11 ENCOUNTER — Ambulatory Visit (INDEPENDENT_AMBULATORY_CARE_PROVIDER_SITE_OTHER): Payer: Medicare Other | Admitting: Family Medicine

## 2022-02-11 ENCOUNTER — Encounter: Payer: Self-pay | Admitting: Family Medicine

## 2022-02-11 VITALS — BP 106/65 | HR 77 | Temp 98.6°F | Wt 146.3 lb

## 2022-02-11 DIAGNOSIS — K5792 Diverticulitis of intestine, part unspecified, without perforation or abscess without bleeding: Secondary | ICD-10-CM | POA: Insufficient documentation

## 2022-02-11 MED ORDER — AMOXICILLIN-POT CLAVULANATE 875-125 MG PO TABS: 1.0000 | ORAL_TABLET | Freq: Three times a day (TID) | ORAL | 0 refills | Status: AC

## 2022-02-11 NOTE — Assessment & Plan Note (Signed)
Patient with LLQ pain, change in bowel habits and malaise/pre-syncope Suspect diverticulitis She was treated last week for possible UTI, but urine culture had no growth Her pain did improve while she was on the Bactrim, but has increased again since stopping Suspect that she has partially/undertreated diverticulitis Known diverticulosis on last colonoscopy She is afebrile and stable and okay for outpatient treatment We will treat with Augmentin x10 days If no improvement or any worsening, we will get CT abdomen pelvis

## 2022-02-12 ENCOUNTER — Telehealth: Payer: Self-pay | Admitting: Family Medicine

## 2022-02-12 DIAGNOSIS — J302 Other seasonal allergic rhinitis: Secondary | ICD-10-CM

## 2022-02-12 MED ORDER — MONTELUKAST SODIUM 10 MG PO TABS: 10.0000 mg | ORAL_TABLET | Freq: Every day | ORAL | 3 refills | Status: DC

## 2022-02-12 NOTE — Telephone Encounter (Signed)
[facility] faxed refill request for the following medications:   montelukast (SINGULAIR) 10 MG tablet     Please advise

## 2022-02-15 ENCOUNTER — Telehealth: Payer: Self-pay

## 2022-02-15 DIAGNOSIS — R1032 Left lower quadrant pain: Secondary | ICD-10-CM

## 2022-02-15 NOTE — Telephone Encounter (Signed)
Copied from [place] [phone]. Topic: General - Other >> Feb 15, 2022  9:43 AM Sabas Sous wrote: Reason for CRM: Pt wants to have a CT scan after she completes her abx, please advise. She wants to be scheduled 02/25/2022 around 2 pm or after.

## 2022-02-18 NOTE — Telephone Encounter (Signed)
Called, left VM to call back.

## 2022-02-18 NOTE — Telephone Encounter (Signed)
Lmtcb. PEC please advise.

## 2022-02-18 NOTE — Telephone Encounter (Signed)
Status: Patient states this is the first day she has not been doubled over pain ( 8-to 7 in pain level)- patient is hoping by Friday it will be better. Patient is not sure if she needs extension on antibiotics or CT ordered. Does she want to wait to schedule and see how she is on Friday- patient will contact provider if she gets worse and not better.

## 2022-02-18 NOTE — Telephone Encounter (Signed)
Ok to wait to see how she feels before ordering CT

## 2022-02-21 NOTE — Addendum Note (Signed)
Addended by: Virginia Crews on: 02/21/2022 10:18 AM   Modules accepted: Orders

## 2022-02-21 NOTE — Telephone Encounter (Signed)
Pt is calling and stated please go ahead and schedule CT stated she is still not feeling well. Pt asked to call her directly before scheduling as she has to work around her husband's schedule. Pt stated her abdominal pain right now is a low 7. Pt declined to speak with NT. She just wants her CT scheduled; she stated that she has already spoken to NT.  Please advise.

## 2022-02-21 NOTE — Telephone Encounter (Signed)
Patient advised.

## 2022-02-22 ENCOUNTER — Ambulatory Visit: Payer: Self-pay | Admitting: *Deleted

## 2022-02-22 MED ORDER — AMOXICILLIN-POT CLAVULANATE 875-125 MG PO TABS: 1.0000 | ORAL_TABLET | Freq: Three times a day (TID) | ORAL | 0 refills | Status: DC

## 2022-02-22 NOTE — Telephone Encounter (Signed)
Summary: Requesting Rx for antibiotics.   Pt is calling in to request another 4 days of antibiotics. Pt stated her 10th day is today, on the last antibiotic Dr.B had prescribed says it has improved, but something is still not right.  Pt mentioned she has scheduled a CT.  Pt seeking clinical advice.        Patient states the antibiotics seem to be helping- but still in pain. Has CT scan scheduled. Patient states she was told 10 or 14 days of antibiotics- wants to go ahead and do the 14 days- asking to extend Rx.  Reason for Disposition  [1] Pharmacy calling with prescription question AND [2] triager unable to answer question  Answer Assessment - Initial Assessment Questions 1. NAME of MEDICINE: "What medicine(s) are you calling about?"     amoxicillin-clavulanate (AUGMENTIN) 875-125 MG tablet 2. QUESTION: "What is your question?" (e.g., double dose of medicine, side effect)     Can this be extended for 14 day treatment?-4 additional days 3. PRESCRIBER: "Who prescribed the medicine?" Reason: if prescribed by specialist, call should be referred to that group.     PCP 4. SYMPTOMS: "Do you have any symptoms?" If Yes, ask: "What symptoms are you having?"  "How bad are the symptoms (e.g., mild, moderate, severe)     Abdominal pain  Protocols used: Medication Question Call-A-[facility]

## 2022-02-22 NOTE — Addendum Note (Signed)
Addended by: Shawna Orleans on: 02/22/2022 11:41 AM   Modules accepted: Orders

## 2022-02-22 NOTE — Telephone Encounter (Signed)
Ok to refill her Augmentin TID Rx for 4 more days

## 2022-02-23 ENCOUNTER — Observation Stay
Admission: EM | Admit: 2022-02-23 | Discharge: 2022-02-24 | Disposition: A | Discharge status: Home / Self Care | Payer: Medicare Other | Attending: Internal Medicine | Admitting: Internal Medicine

## 2022-02-23 ENCOUNTER — Other Ambulatory Visit: Payer: Self-pay

## 2022-02-23 ENCOUNTER — Emergency Department: Payer: Medicare Other

## 2022-02-23 DIAGNOSIS — R1032 Left lower quadrant pain: Secondary | ICD-10-CM | POA: Diagnosis present

## 2022-02-23 DIAGNOSIS — E119 Type 2 diabetes mellitus without complications: Secondary | ICD-10-CM | POA: Insufficient documentation

## 2022-02-23 DIAGNOSIS — Z853 Personal history of malignant neoplasm of breast: Secondary | ICD-10-CM | POA: Diagnosis not present

## 2022-02-23 DIAGNOSIS — Z79899 Other long term (current) drug therapy: Secondary | ICD-10-CM | POA: Insufficient documentation

## 2022-02-23 DIAGNOSIS — K5900 Constipation, unspecified: Secondary | ICD-10-CM | POA: Diagnosis not present

## 2022-02-23 DIAGNOSIS — I10 Essential (primary) hypertension: Secondary | ICD-10-CM | POA: Insufficient documentation

## 2022-02-23 DIAGNOSIS — J309 Allergic rhinitis, unspecified: Secondary | ICD-10-CM | POA: Diagnosis present

## 2022-02-23 DIAGNOSIS — K5792 Diverticulitis of intestine, part unspecified, without perforation or abscess without bleeding: Secondary | ICD-10-CM | POA: Diagnosis not present

## 2022-02-23 HISTORY — DX: Diverticulitis of intestine, part unspecified, without perforation or abscess without bleeding: K57.92

## 2022-02-23 LAB — COMPREHENSIVE METABOLIC PANEL
ALT: 27 U/L (ref 0–44)
AST: 23 U/L (ref 15–41)
Albumin: 4 g/dL (ref 3.5–5.0)
Alkaline Phosphatase: 87 U/L (ref 38–126)
Anion gap: 11 (ref 5–15)
BUN: 14 mg/dL (ref 8–23)
CO2: 25 mmol/L (ref 22–32)
Calcium: 9.2 mg/dL (ref 8.9–10.3)
Chloride: 101 mmol/L (ref 98–111)
Creatinine, Ser: 0.53 mg/dL (ref 0.44–1.00)
GFR, Estimated: >60 mL/min (ref >60)
Glucose, Bld: 118 mg/dL — ABNORMAL HIGH (ref 70–99)
Potassium: 3.7 mmol/L (ref 3.5–5.1)
Sodium: 137 mmol/L (ref 135–145)
Total Bilirubin: 1.3 mg/dL — ABNORMAL HIGH (ref 0.3–1.2)
Total Protein: 7.4 g/dL (ref 6.5–8.1)

## 2022-02-23 LAB — URINALYSIS, ROUTINE W REFLEX MICROSCOPIC
Bilirubin Urine: NEGATIVE
Glucose, UA: NEGATIVE mg/dL
Hgb urine dipstick: NEGATIVE
Ketones, ur: 20 mg/dL — AB
Leukocytes,Ua: NEGATIVE
Nitrite: NEGATIVE
Protein, ur: NEGATIVE mg/dL
Specific Gravity, Urine: 1.024 (ref 1.005–1.030)
pH: 5 (ref 5.0–8.0)

## 2022-02-23 LAB — CBC
HCT: 41.6 % (ref 36.0–46.0)
Hemoglobin: 14.1 g/dL (ref 12.0–15.0)
MCH: 30.1 pg (ref 26.0–34.0)
MCHC: 33.9 g/dL (ref 30.0–36.0)
MCV: 88.7 fL (ref 80.0–100.0)
Platelets: 205 10*3/uL (ref 150–400)
RBC: 4.69 MIL/uL (ref 3.87–5.11)
RDW: 12.2 % (ref 11.5–15.5)
WBC: 12.1 10*3/uL — ABNORMAL HIGH (ref 4.0–10.5)
nRBC: 0 % (ref 0.0–0.2)

## 2022-02-23 LAB — LIPASE, BLOOD: Lipase: 36 U/L (ref 11–51)

## 2022-02-23 MED ORDER — ONDANSETRON HCL 4 MG/2ML IJ SOLN
4.0000 mg | Freq: Once | INTRAMUSCULAR | Status: AC
  Administered 2022-02-23: 4 mg via INTRAVENOUS
  Filled 2022-02-23: qty 2

## 2022-02-23 MED ORDER — MONTELUKAST SODIUM 10 MG PO TABS
10.0000 mg | ORAL_TABLET | Freq: Every day | ORAL | Status: DC
  Administered 2022-02-24: 10 mg via ORAL
  Filled 2022-02-23: qty 1

## 2022-02-23 MED ORDER — ENOXAPARIN SODIUM 40 MG/0.4ML IJ SOSY
40.0000 mg | PREFILLED_SYRINGE | INTRAMUSCULAR | Status: DC
  Administered 2022-02-24: 40 mg via SUBCUTANEOUS
  Filled 2022-02-23: qty 0.4

## 2022-02-23 MED ORDER — LISINOPRIL-HYDROCHLOROTHIAZIDE 20-25 MG PO TABS: 0.5000 | ORAL_TABLET | Freq: Every day | ORAL | Status: DC

## 2022-02-23 MED ORDER — SODIUM CHLORIDE 0.9 % IV BOLUS: 500.0000 mL | Freq: Once | INTRAVENOUS | Status: DC

## 2022-02-23 MED ORDER — PIPERACILLIN-TAZOBACTAM 3.375 G IVPB 30 MIN
3.3750 g | Freq: Once | INTRAVENOUS | Status: AC
  Administered 2022-02-23: 3.375 g via INTRAVENOUS
  Filled 2022-02-23: qty 50

## 2022-02-23 MED ORDER — IOHEXOL 300 MG/ML  SOLN
100.0000 mL | Freq: Once | INTRAMUSCULAR | Status: AC | PRN
  Administered 2022-02-23: 100 mL via INTRAVENOUS

## 2022-02-23 MED ORDER — HYDROCHLOROTHIAZIDE 12.5 MG PO TABS
12.5000 mg | ORAL_TABLET | Freq: Every day | ORAL | Status: DC
  Administered 2022-02-24: 12.5 mg via ORAL
  Filled 2022-02-23: qty 1

## 2022-02-23 MED ORDER — LISINOPRIL 10 MG PO TABS
10.0000 mg | ORAL_TABLET | Freq: Every day | ORAL | Status: DC
  Administered 2022-02-24: 10 mg via ORAL
  Filled 2022-02-23: qty 1

## 2022-02-23 MED ORDER — PIPERACILLIN-TAZOBACTAM 3.375 G IVPB
3.3750 g | Freq: Three times a day (TID) | INTRAVENOUS | Status: DC
  Administered 2022-02-24: 3.375 g via INTRAVENOUS
  Filled 2022-02-23: qty 50

## 2022-02-23 MED ORDER — ONDANSETRON HCL 4 MG PO TABS: 4.0000 mg | ORAL_TABLET | Freq: Four times a day (QID) | ORAL | Status: DC | PRN

## 2022-02-23 MED ORDER — ONDANSETRON HCL 4 MG/2ML IJ SOLN: 4.0000 mg | Freq: Four times a day (QID) | INTRAMUSCULAR | Status: DC | PRN

## 2022-02-23 MED ORDER — OXYCODONE HCL 5 MG PO TABS
5.0000 mg | ORAL_TABLET | ORAL | Status: DC | PRN
  Administered 2022-02-24: 5 mg via ORAL
  Filled 2022-02-23 (×2): qty 1

## 2022-02-23 MED ORDER — FLUTICASONE PROPIONATE 50 MCG/ACT NA SUSP: 1.0000 | Freq: Every day | NASAL | Status: DC | PRN

## 2022-02-23 NOTE — H&P (Signed)
History and Physical    Alyssa Thornton YBO:[id number] DOB: [birth year] DOA: 02/23/2022  PCP: Virginia Crews, MD  Patient coming from: Home    Chief Complaint: LLQ abdominal pain X 1 month  HPI: Alyssa Thornton is a 66 y.o. female with medical history significant of Allergic rhinitis,  HTN, pre DM (A1C of 5.7) who presents for LLQ abdominal pain.  Ms. Madewell reports that her symptoms started back on October.  She saw her PC office and was started on Bactrim for 5 days and felt some better.  However, symptoms recurred and she saw her PCP on 11/6 and was started on Augmentin for a 10 day course.  She called her PCP on 11/16 as she was not improved much and an outpatient CT scan was planned, they also decided to extend the antibiotics to a full 14 days.   Today, she reports feeling worse, so she came in to the ED.  Her abdominal pain has been in the LLQ and LUQ for this entire time.  At it's worst, it is 7-8/10.  She has not tried any medications for it.  She notes having to change position and sometimes walk around to get it to ease up.  She has not had any emesis, nausea, diarrhea, constipation.  She has been eating and drinking like normal.  Only other symptom she reports is upper left back pain, which started about 2 days ago, but she is not sure it's related.   ED Course: In the ED, she had a CT scan of her abdomen which showed mild diverticulitis of the descending and sigmoid colon without fluid collection.  She has a mildly elevated WBC at 12.1.   Review of Systems: As per HPI otherwise all other systems reviewed and are negative.   Past Medical History:  Diagnosis Date   Atypical hyperplasia of left breast 07/02/2013   1 mm area of atypia associated with a calcified papilloma.   BRCA negative 01/2021   MyRisk neg   Fatty liver    Hypertension    Increased risk of breast cancer 01/2021   IBIS=27.4%   Osteopenia    Postmenopausal atrophic vaginitis     Past Surgical History:   Procedure Laterality Date   bartholins gland surgery     BREAST BIOPSY Left 11/15/2005   LEFT BREAST, CORE BIOPSY:: STROMAL SCLEROSIS WITHIN BENIGN ATROPHIC BREAST TISSUE   BREAST BIOPSY Left July 02, 2013   left breast papilloma with focal atypia   BREAST EXCISIONAL BIOPSY Left 2015   COLONOSCOPY  2007-2008, 2015   Dr Byrnett/Dr [place] History  reports that she has never smoked. She has never used smokeless tobacco. She reports that she does not drink alcohol and does not use drugs.  Allergies  Allergen Reactions   Levofloxacin Nausea And Vomiting    Other reaction(s): Vomiting   Diclofenac Sodium Diarrhea   Tramadol Diarrhea    Family History  Problem Relation Age of Onset   Heart disease Father    Hypertension Father    Cancer Sister        bone marrow cancer   Diabetes Mother        type 2   Cirrhosis Mother    Hypercholesterolemia Brother    Hyperlipidemia Brother    Cancer - Ovarian Maternal Grandmother        90   Stomach cancer Other 31   Breast cancer Neg Hx  Prior to Admission medications   Medication Sig Start Date End Date Taking? Authorizing Provider  amoxicillin-clavulanate (AUGMENTIN) 875-125 MG tablet Take 1 tablet by mouth 3 (three) times daily for 4 days. 02/22/22 02/26/22  Virginia Crews, MD  Cholecalciferol (VITAMIN D-3 PO) Take 1,000 Int'l Units by mouth in the morning and at bedtime. 2 tabs daily with a meal    [provider]  fluticasone (FLONASE) 50 MCG/ACT nasal spray SHAKE LIQUID AND USE 1 SPRAY IN EACH NOSTRIL DAILY AS NEEDED 08/28/21   Bacigalupo, Dionne Bucy, MD  lisinopril-hydrochlorothiazide (ZESTORETIC) 20-25 MG tablet Take 0.5 tablets by mouth daily. 06/14/21   Virginia Crews, MD  montelukast (SINGULAIR) 10 MG tablet Take 1 tablet (10 mg total) by mouth daily. 02/12/22   Bacigalupo, Dionne Bucy, MD  vitamin C (ASCORBIC ACID) 500 MG tablet Take 500 mg by mouth daily.     [provider]    Physical Exam: Vitals:   02/23/22 1740 02/23/22 1900 02/23/22 2000 02/23/22 2105  BP:  128/80 119/72 115/78  Pulse: 95  96 94  Resp:   16 18  Temp:   98 F (36.7 C)   TempSrc:      SpO2:   98% 98%  Weight:      Height:        Constitutional: NAD, calm, comfortable Eyes: lids and conjunctivae normal ENMT: Mucous membranes are moist.  Neck: normal, supple Respiratory: clear to auscultation bilaterally, no wheezing, no crackles. Normal respiratory effort.  Cardiovascular: RR, NR, no murmur, no LE edema Abdomen: + TTP in the LLQ, +BS, non tender in other sites, no swelling or distention Musculoskeletal: no clubbing / cyanosis. Normal tone and bulk for age.  She has some mild TTP over the left upper paraspinal muscles Skin: no rashes, lesions, ulcers on exposed skin Neurologic: Grossly intact, moving all extremities easily in bed Psychiatric: Normal judgment and insight. Alert and oriented x 3. Normal mood.    Labs on Admission: I have personally reviewed following labs and imaging studies  CBC: Recent Labs  Lab 02/23/22 1441  WBC 12.1*  HGB 14.1  HCT 41.6  MCV 88.7  PLT 416    Basic Metabolic Panel: Recent Labs  Lab 02/23/22 1441  NA 137  K 3.7  CL 101  CO2 25  GLUCOSE 118*  BUN 14  CREATININE 0.53  CALCIUM 9.2    GFR: Estimated Creatinine Clearance: 60.4 mL/min (by C-G formula based on SCr of 0.53 mg/dL).  Liver Function Tests: Recent Labs  Lab 02/23/22 1441  AST 23  ALT 27  ALKPHOS 87  BILITOT 1.3*  PROT 7.4  ALBUMIN 4.0    Urine analysis:    Component Value Date/Time   COLORURINE YELLOW (A) 02/23/2022 1438   APPEARANCEUR HAZY (A) 02/23/2022 1438   LABSPEC 1.024 02/23/2022 1438   PHURINE 5.0 02/23/2022 1438   GLUCOSEU NEGATIVE 02/23/2022 1438   [place] 02/23/2022 1438   [place]chell 02/23/2022 1438   BILIRUBINUR negative 01/25/2022 1527   KETONESUR 20 (A) 02/23/2022 1438   PROTEINUR NEGATIVE  02/23/2022 1438   UROBILINOGEN 0.2 01/25/2022 1527   NITRITE NEGATIVE 02/23/2022 1438   LEUKOCYTESUR NEGATIVE 02/23/2022 1438    Radiological Exams on Admission: CT ABDOMEN PELVIS W CONTRAST  Result Date: 02/23/2022 CLINICAL DATA:  Left lower quadrant abdominal pain EXAM: CT ABDOMEN AND PELVIS WITH CONTRAST TECHNIQUE: Multidetector CT imaging of the abdomen and pelvis was performed using the standard protocol following bolus administration of intravenous contrast. RADIATION  DOSE REDUCTION: This exam was performed according to the departmental dose-optimization program which includes automated exposure control, adjustment of the mA and/or kV according to patient size and/or use of iterative reconstruction technique. CONTRAST:  164m OMNIPAQUE IOHEXOL 300 MG/ML  SOLN COMPARISON:  None Available. FINDINGS: Lower chest: Lung bases are clear. Hepatobiliary: Liver is within normal limits. Gallbladder is unremarkable. No intrahepatic or extrahepatic duct dilatation. Pancreas: Within normal limits. Spleen: Within normal limits Adrenals/Urinary Tract: Adrenal glands are within normal limits. Kidneys are within normal limits.  No hydronephrosis. Bladder is within normal limits. Stomach/Bowel: Stomach is notable for a small hiatal hernia. No evidence of bowel obstruction. Normal appendix (series 2/image 58). Descending colonic diverticulitis (series 2/image 53) with associated short segment colonic wall thickening (series 2/image 86) and mild pericolonic inflammatory changes. Additional sigmoid diverticulosis with suspected mild pericolonic inflammatory changes in the left pelvis (series 2/image 87), suggesting mild sigmoid diverticulitis. No drainable fluid collection/abscess. No free air to suggest macroscopic perforation. Vascular/Lymphatic: No evidence of abdominal aortic aneurysm. Atherosclerotic calcifications of the abdominal aorta and branch vessels. No suspicious abdominopelvic lymphadenopathy. Reproductive:  Uterus is within normal limits. Bilateral ovaries are within normal limits. Other: No abdominopelvic ascites. Musculoskeletal: Mild degenerative changes of the visualized thoracolumbar spine. IMPRESSION: Mild acute descending and sigmoid diverticulitis. No drainable fluid collection/abscess. No free air to suggest macroscopic perforation. Electronically Signed   By: SJulian HyM.D.   On: 02/23/2022 20:30      Assessment/Plan  Acute diverticulitis Left lower quadrant abdominal pain - Pain control with oxycodone PRN  - Zosyn started per pharmacy protocol - NPO, can start food tomorrow if doing well - Monitor for fever or other changes - Trend WBC  Allergic rhinitis - Continue PRN flonase and singulair    Essential hypertension - Continue home lisinopril-hctz     DVT prophylaxis: Lovenox  Code Status:   Full  Disposition Plan:   Patient is from:  Home  Anticipated DC to:  Home  Anticipated DC date:  02/24/22 (patient may insist)  Anticipated DC barriers: Pain control, need for further Abx  Consults called:  None  Admission status:  Obs, med surg   Severity of Illness: The appropriate patient status for this patient is OBSERVATION. Observation status is judged to be reasonable and necessary in order to provide the required intensity of service to ensure the patient's safety. The patient's presenting symptoms, physical exam findings, and initial radiographic and laboratory data in the context of their medical condition is felt to place them at decreased risk for further clinical deterioration. Furthermore, it is anticipated that the patient will be medically stable for discharge from the hospital within 2 midnights of admission.     EGilles ChiquitoMD [facility]  How to contact the TCol[facility] Psychiatric HospitalAttending or Consulting provider [place] covering provider during after hours [place] for this patient?   Check the care team in [facility] Centerand look for a) attending/consulting TR[facility] provider  listed and b) the TM[facility] Center LLCteam listed Log into [email or web address] and use [place]'s universal password to access. If you do not have the password, please contact the hospital operator. Locate the TMem[facility] Womens Hospitalprovider you are looking for under [facility] and page to a number that you can be directly reached. If you still have difficulty reaching the provider, please page the D[facility](Director on Call) for the Hospitalists listed on amion for assistance.  02/23/2022, 9:45 PM

## 2022-02-23 NOTE — ED Provider Notes (Signed)
[facility] Provider Note    Event Date/Time   First MD Initiated Contact with Patient 02/23/22 1738     (approximate)   History   Abdominal Pain   HPI  Alyssa Thornton is a 66 y.o. female who on review of previous records including from her primary care has a history of diverticulosis and has been recently treated for suspicion of diverticulitis.  She is currently on Augmentin.  Over about the last months she is experienced pain in her mid to left abdomen.  She was initially treated with Bactrim for a few days for suspicion of a urinary tract infection or possibly diverticulitis then symptoms improved.  She started having pain again has had a persistent pain now for at least about 2 weeks over her mid to left upper abdomen.  She is currently on Augmentin, and her primary care has scheduled her for a CT scan to occur in a few more days, but she comes tonight as she is concerned that she needs a CT scan sooner due to the ongoing symptoms of left-sided pain.  She still eating and drinking denies nausea and vomiting.  Reports having normal bowel movements.  No chest pain no trouble breathing.  It is a persistent feeling sort of hard to describe fullness located in her mid left abdomen.  No fevers or chills     Physical Exam   Triage Vital Signs: ED Triage Vitals  Enc Vitals Group     BP 02/23/22 1438 135/89     Pulse Rate 02/23/22 1438 (!) 110     Resp 02/23/22 1438 20     Temp 02/23/22 1438 98.5 F (36.9 C)     Temp Source 02/23/22 1438 Oral     SpO2 02/23/22 1438 99 %     Weight 02/23/22 1437 140 lb (63.5 kg)     Height 02/23/22 1437 '5\' 3"'$  (1.6 m)     Head Circumference --      Peak Flow --      Pain Score 02/23/22 1437 6     Pain Loc --      Pain Edu? --      Excl. in [place]? --     Most recent vital signs: Vitals:   02/23/22 1900 02/23/22 2000  BP: 128/80 119/72  Pulse:  96  Resp:  16  Temp:  98 F (36.7 C)  SpO2:  98%     General: Awake,  no distress.  Very pleasant stands without difficulty. CV:  Good peripheral perfusion.  Normal heart tones Resp:  Normal effort.  Clear lungs bilaterally Abd:  No distention.  Her abdomen is soft, and she reports tenderness in the mid left flank region left upper quadrant and left lower quadrant but there is no rebound or guarding, and it is not entirely easily reproducible either.  Seems to be a somewhat deep-seated left sided abdominal pain. Other:  Normal and perfused lower extremities   ED Results / Procedures / Treatments   Labs (all labs ordered are listed, but only abnormal results are displayed) Labs Reviewed  COMPREHENSIVE METABOLIC PANEL - Abnormal; Notable for the following components:      Result Value   Glucose, Bld 118 (*)    Total Bilirubin 1.3 (*)    All other components within normal limits  CBC - Abnormal; Notable for the following components:   WBC 12.1 (*)    All other components within normal limits  URINALYSIS, ROUTINE W REFLEX  MICROSCOPIC - Abnormal; Notable for the following components:   Color, Urine YELLOW (*)    APPearance HAZY (*)    Ketones, ur 20 (*)    All other components within normal limits  LIPASE, BLOOD    RADIOLOGY  CT abdomen pelvis pending at time of signout   PROCEDURES:  Critical Care performed: No  Procedures   MEDICATIONS ORDERED IN ED: Medications  piperacillin-tazobactam (ZOSYN) IVPB 3.375 g (has no administration in time range)  ondansetron (ZOFRAN) injection 4 mg (has no administration in time range)  iohexol (OMNIPAQUE) 300 MG/ML solution 100 mL (100 mLs Intravenous Contrast Given 02/23/22 2012)     IMPRESSION / MDM / ASSESSMENT AND PLAN / ED COURSE  I reviewed the triage vital signs and the nursing notes.                              Differential diagnosis includes but is not limited to, abdominal perforation, aortic dissection, cholecystitis, appendicitis, diverticulitis, colitis, esophagitis/gastritis, kidney  stone, pyelonephritis, urinary tract infection, aortic aneurysm. All are considered in decision and treatment plan. Based upon the patient's presentation and risk factors, I believe the patient would benefit from a CT scan of the abdomen pelvis to evaluate for cause for left-sided abdominal pain.  High on the differential would be diverticulitis diverticular disease or other causes such as colonic mass lesion infarct etc. are all considered.  No sudden severe onset pain that would be suggestive of vascular abnormality.  Her labs are reassuring except for a mild leukocytosis.  Urinalysis shows no evidence of infection.  She is currently on Augmentin which she started yesterday.  She is well-appearing nontoxic.  She drove herself here, and does not wish for any pain medication at present and does not think she would want to be prescribed any pain medicine either but is very interested in trying to figure out what is causing her abdominal pain, and we will proceed with imaging.     Patient's presentation is most consistent with acute complicated illness / injury requiring diagnostic workup.   ----------------------------------------- 7:51 PM on 02/23/2022 -----------------------------------------   Ongoing care and disposition assigned to my partner Aquilla Solian, PA.  She did discuss with me that the patient's CT resulted with diverticulitis, and based on the fact that the patient has actually been on Augmentin now for a total of approximately 10 days and still having ongoing pain and symptoms we will admit the patient for treatment of diverticulitis with failed outpatient  FINAL CLINICAL IMPRESSION(S) / ED DIAGNOSES   Final diagnoses:  Diverticulitis     Rx / DC Orders   ED Discharge Orders     None        Note:  This document was prepared using Dragon voice recognition software and may include unintentional dictation errors.   Delman Kitten, MD 02/23/22 2101

## 2022-02-23 NOTE — Plan of Care (Signed)
  Problem: Clinical Measurements: Goal: Diagnostic test results will improve Outcome: Progressing   Problem: Nutrition: Goal: Adequate nutrition will be maintained Outcome: Progressing   Problem: Coping: Goal: Level of anxiety will decrease Outcome: Progressing   Problem: Pain Managment: Goal: General experience of comfort will improve Outcome: Progressing

## 2022-02-23 NOTE — ED Triage Notes (Signed)
Pt reports abd pain mostly on her left side. Pt states has been taking abx for a colon infection but does not seem to help. Pt is scheduled for a CT scan on Wednesday but states the pain is no better so she wanted to get checked out.

## 2022-02-23 NOTE — Progress Notes (Signed)
Pharmacy Antibiotic Note  Alyssa Thornton is a 66 y.o. female admitted on 02/23/2022 with intra-abdominal infection / diverticulitis.  Pharmacy has been consulted for Zosyn dosing.  Plan: Zosyn 3.375g IV q8h (4 hour infusion).  Pharmacy will continue to follow and will adjust abx dosing whenever warranted.  Temp (24hrs), Avg:98.3 F (36.8 C), Min:98 F (36.7 C), Max:98.5 F (36.9 C)   Recent Labs  Lab 02/23/22 1441  WBC 12.1*  CREATININE 0.53    Estimated Creatinine Clearance: 60.4 mL/min (by C-G formula based on SCr of 0.53 mg/dL).    Allergies  Allergen Reactions   Levofloxacin Nausea And Vomiting    Other reaction(s): Vomiting   Diclofenac Sodium Diarrhea   Tramadol Diarrhea    Antimicrobials this admission: 11/18 Zosyn >>   Microbiology results: No lab cx currently ordered or pending at this time.  Thank you for allowing pharmacy to be a part of this patient's care.  Renda Rolls, PharmD, [facility] 02/23/2022 9:48 PM

## 2022-02-23 NOTE — ED Notes (Signed)
Request made for transport to the floor ?

## 2022-02-23 NOTE — ED Notes (Signed)
Pt to ED for L sided abdominal pain since October. States has already been treated for UTI and PCP suspects diverticulitis. Has CT scan scheduled for this coming Wednesday. Pain currently 7/10. Denies NVD and constipation. Pain was really bad last night.

## 2022-02-24 DIAGNOSIS — I10 Essential (primary) hypertension: Secondary | ICD-10-CM | POA: Diagnosis not present

## 2022-02-24 DIAGNOSIS — K5792 Diverticulitis of intestine, part unspecified, without perforation or abscess without bleeding: Secondary | ICD-10-CM

## 2022-02-24 LAB — COMPREHENSIVE METABOLIC PANEL
ALT: 23 U/L (ref 0–44)
AST: 20 U/L (ref 15–41)
Albumin: 3.8 g/dL (ref 3.5–5.0)
Alkaline Phosphatase: 76 U/L (ref 38–126)
Anion gap: 10 (ref 5–15)
BUN: 14 mg/dL (ref 8–23)
CO2: 25 mmol/L (ref 22–32)
Calcium: 9.1 mg/dL (ref 8.9–10.3)
Chloride: 104 mmol/L (ref 98–111)
Creatinine, Ser: 0.52 mg/dL (ref 0.44–1.00)
GFR, Estimated: >60 mL/min (ref >60)
Glucose, Bld: 105 mg/dL — ABNORMAL HIGH (ref 70–99)
Potassium: 3.9 mmol/L (ref 3.5–5.1)
Sodium: 139 mmol/L (ref 135–145)
Total Bilirubin: 1.8 mg/dL — ABNORMAL HIGH (ref 0.3–1.2)
Total Protein: 6.9 g/dL (ref 6.5–8.1)

## 2022-02-24 LAB — CBC
HCT: 39.7 % (ref 36.0–46.0)
Hemoglobin: 13.7 g/dL (ref 12.0–15.0)
MCH: 30.6 pg (ref 26.0–34.0)
MCHC: 34.5 g/dL (ref 30.0–36.0)
MCV: 88.6 fL (ref 80.0–100.0)
Platelets: 193 10*3/uL (ref 150–400)
RBC: 4.48 MIL/uL (ref 3.87–5.11)
RDW: 12.2 % (ref 11.5–15.5)
WBC: 10.6 10*3/uL — ABNORMAL HIGH (ref 4.0–10.5)
nRBC: 0 % (ref 0.0–0.2)

## 2022-02-24 LAB — HIV ANTIBODY (ROUTINE TESTING W REFLEX): HIV Screen 4th Generation wRfx: NONREACTIVE

## 2022-02-24 MED ORDER — METRONIDAZOLE 500 MG PO TABS: 500.0000 mg | ORAL_TABLET | Freq: Two times a day (BID) | ORAL | 0 refills | Status: AC

## 2022-02-24 MED ORDER — SENNOSIDES-DOCUSATE SODIUM 8.6-50 MG PO TABS
2.0000 | ORAL_TABLET | Freq: Two times a day (BID) | ORAL | Status: DC
  Administered 2022-02-24: 2 via ORAL
  Filled 2022-02-24: qty 2

## 2022-02-24 MED ORDER — DICYCLOMINE HCL 10 MG PO CAPS
10.0000 mg | ORAL_CAPSULE | Freq: Once | ORAL | Status: AC
  Administered 2022-02-24: 10 mg via ORAL
  Filled 2022-02-24: qty 1

## 2022-02-24 MED ORDER — CEPHALEXIN 500 MG PO CAPS: 500.0000 mg | ORAL_CAPSULE | Freq: Three times a day (TID) | ORAL | 0 refills | Status: AC

## 2022-02-24 MED ORDER — LACTULOSE 10 GM/15ML PO SOLN
20.0000 g | Freq: Once | ORAL | Status: AC
  Administered 2022-02-24: 20 g via ORAL
  Filled 2022-02-24: qty 30

## 2022-02-24 MED ORDER — SENNOSIDES-DOCUSATE SODIUM 8.6-50 MG PO TABS: 2.0000 | ORAL_TABLET | Freq: Two times a day (BID) | ORAL | 0 refills | Status: DC | PRN

## 2022-02-24 NOTE — Discharge Summary (Signed)
Physician Discharge Summary   Patient: Alyssa Thornton MRN: [id number] DOB: 08/12/1955  Admit date:     02/23/2022  Discharge date: 02/24/22  Discharge Physician: Sharen Hones   PCP: Virginia Crews, MD   Recommendations at discharge:   Follow-up with PCP in 1 week.  Discharge Diagnoses: Principal Problem:   Acute diverticulitis Active Problems:   Allergic rhinitis   Essential hypertension   Left lower quadrant abdominal pain  Resolved Problems:   * No resolved hospital problems. *  Hospital Course: Alyssa Thornton is a 66 y.o. female with medical history significant of Allergic rhinitis,  HTN, pre DM (A1C of 5.7) who presents for LLQ abdominal pain.  She was a previous diagnosed with acute diverticulitis, was treated with 2 course of antibiotics in the office.  She came back with significant left lower quadrant abdominal pain.  CT scan showed diverticulitis again.  She was placed on Zosyn. Her symptom appears to be complicated by constipation.  Over the last few days, she has daily bowel movement but stools for small.  She was given lactulose, she had multiple stools, abdominal pain has been better.  Given persistent changes on CT scan, I will treat her for additional 10 days with oral Keflex and Flagyl.  Assessment and Plan: Acute diverticulitis. Constipation. Condition feels better.  Due to persistent changes on CT scan, she will be treated with additional 10 days of Keflex and Flagyl.  Essential hypertension. Resume home medicines.       Consultants: None Procedures performed: None  Disposition: Home Diet recommendation:  Discharge Diet Orders (From admission, onward)     Start     Ordered   02/24/22 0000  Diet - low sodium heart healthy        02/24/22 1205           Cardiac diet DISCHARGE MEDICATION: Allergies as of 02/24/2022       Reactions   Levofloxacin Nausea And Vomiting   Other reaction(s): Vomiting   Diclofenac Sodium Diarrhea   Tramadol  Diarrhea        Medication List     STOP taking these medications    amoxicillin-clavulanate 875-125 MG tablet Commonly known as: AUGMENTIN       TAKE these medications    ascorbic acid 500 MG tablet Commonly known as: VITAMIN C Take 500 mg by mouth daily.   cephALEXin 500 MG capsule Commonly known as: KEFLEX Take 1 capsule (500 mg total) by mouth 3 (three) times daily for 10 days.   fluticasone 50 MCG/ACT nasal spray Commonly known as: FLONASE SHAKE LIQUID AND USE 1 SPRAY IN EACH NOSTRIL DAILY AS NEEDED   lisinopril-hydrochlorothiazide 20-25 MG tablet Commonly known as: ZESTORETIC Take 0.5 tablets by mouth daily.   metroNIDAZOLE 500 MG tablet Commonly known as: Flagyl Take 1 tablet (500 mg total) by mouth 2 (two) times daily for 10 days.   montelukast 10 MG tablet Commonly known as: SINGULAIR Take 1 tablet (10 mg total) by mouth daily.   senna-docusate 8.6-50 MG tablet Commonly known as: Senokot-S Take 2 tablets by mouth 2 (two) times daily as needed for mild constipation.   VITAMIN D-3 PO Take 1,000 Int'l Units by mouth in the morning and at bedtime. 2 tabs daily with a meal        Follow-up Information     Virginia Crews, MD Follow up in 1 week(s).   Specialty: Family Medicine Contact information: [address] [place] [address]  [phone]                Discharge Exam: Filed Weights   02/23/22 1437 02/23/22 2318  Weight: 63.5 kg 63.8 kg   General exam: Appears calm and comfortable  Respiratory system: Clear to auscultation. Respiratory effort normal. Cardiovascular system: S1 & S2 heard, RRR. No JVD, murmurs, rubs, gallops or clicks. No pedal edema. Gastrointestinal system: Abdomen is nondistended, soft and nontender. No organomegaly or masses felt. Normal bowel sounds heard. Central nervous system: Alert and oriented. No focal neurological deficits. Extremities: Symmetric 5 x 5 power. Skin: No rashes,  lesions or ulcers Psychiatry: Judgement and insight appear normal. Mood & affect appropriate.    Condition at discharge: good  The results of significant diagnostics from this hospitalization (including imaging, microbiology, ancillary and laboratory) are listed below for reference.   Imaging Studies: CT ABDOMEN PELVIS W CONTRAST  Result Date: 02/23/2022 CLINICAL DATA:  Left lower quadrant abdominal pain EXAM: CT ABDOMEN AND PELVIS WITH CONTRAST TECHNIQUE: Multidetector CT imaging of the abdomen and pelvis was performed using the standard protocol following bolus administration of intravenous contrast. RADIATION DOSE REDUCTION: This exam was performed according to the departmental dose-optimization program which includes automated exposure control, adjustment of the mA and/or kV according to patient size and/or use of iterative reconstruction technique. CONTRAST:  116m OMNIPAQUE IOHEXOL 300 MG/ML  SOLN COMPARISON:  None Available. FINDINGS: Lower chest: Lung bases are clear. Hepatobiliary: Liver is within normal limits. Gallbladder is unremarkable. No intrahepatic or extrahepatic duct dilatation. Pancreas: Within normal limits. Spleen: Within normal limits Adrenals/Urinary Tract: Adrenal glands are within normal limits. Kidneys are within normal limits.  No hydronephrosis. Bladder is within normal limits. Stomach/Bowel: Stomach is notable for a small hiatal hernia. No evidence of bowel obstruction. Normal appendix (series 2/image 58). Descending colonic diverticulitis (series 2/image 53) with associated short segment colonic wall thickening (series 2/image 86) and mild pericolonic inflammatory changes. Additional sigmoid diverticulosis with suspected mild pericolonic inflammatory changes in the left pelvis (series 2/image 87), suggesting mild sigmoid diverticulitis. No drainable fluid collection/abscess. No free air to suggest macroscopic perforation. Vascular/Lymphatic: No evidence of abdominal aortic  aneurysm. Atherosclerotic calcifications of the abdominal aorta and branch vessels. No suspicious abdominopelvic lymphadenopathy. Reproductive: Uterus is within normal limits. Bilateral ovaries are within normal limits. Other: No abdominopelvic ascites. Musculoskeletal: Mild degenerative changes of the visualized thoracolumbar spine. IMPRESSION: Mild acute descending and sigmoid diverticulitis. No drainable fluid collection/abscess. No free air to suggest macroscopic perforation. Electronically Signed   By: SJulian HyM.D.   On: 02/23/2022 20:30    Microbiology: Results for orders placed or performed in visit on 01/25/22  Urine Culture     Status: None   Collection Time: 01/25/22 12:00 AM   Specimen: Urine   Urine  Result Value Ref Range Status   Urine Culture, Routine Final report  Final   Organism ID, Bacteria Comment  Final    Comment: Mixed urogenital flora Less than 10,000 colonies/mL     Labs: CBC: Recent Labs  Lab 02/23/22 1441 02/24/22 0428  WBC 12.1* 10.6*  HGB 14.1 13.7  HCT 41.6 39.7  MCV 88.7 88.6  PLT 205 1458  Basic Metabolic Panel: Recent Labs  Lab 02/23/22 1441 02/24/22 0428  NA 137 139  K 3.7 3.9  CL 101 104  CO2 25 25  GLUCOSE 118* 105*  BUN 14 14  CREATININE 0.53 0.52  CALCIUM 9.2 9.1   Liver Function Tests: Recent Labs  Lab 02/23/22 1441  02/24/22 0428  AST 23 20  ALT 27 23  ALKPHOS 87 76  BILITOT 1.3* 1.8*  PROT 7.4 6.9  ALBUMIN 4.0 3.8   CBG: No results for input(s): "GLUCAP" in the last 168 hours.  Discharge time spent: greater than 30 minutes.  Signed: Sharen Hones, MD [facility] 02/24/2022

## 2022-02-24 NOTE — Plan of Care (Signed)
Patient discharged per MD orders at this time.All discharge instructions,education and medications reviewed with the patient.Pt expressed understanding and will comply with dc instructions.f/u appointments was also communicated to the patient.no verbal c/o or any ssx of distress.Pt was discharged home with self care per order.Pt was transported home by a friend in a privately owned vehicle.

## 2022-02-25 ENCOUNTER — Telehealth: Payer: Self-pay

## 2022-02-25 NOTE — Telephone Encounter (Signed)
Copied from [place] [id number]. Topic: General - Inquiry >> Feb 25, 2022  8:55 AM Ludger Nutting wrote: Patient called to see if we could have her CT scan scheduled for 02/27/22 cancelled. Patient was seen in ED on 02/23/22 and a CT was done then. Please follow up with patient.  Appt canceled as per patient request.

## 2022-02-27 ENCOUNTER — Ambulatory Visit: Admission: RE | Admit: 2022-02-27 | Payer: Medicare Other | Source: Ambulatory Visit

## 2022-03-13 NOTE — Progress Notes (Unsigned)
   I,Yancy Hascall S Ayisha Pol,acting as a Education administrator for Lavon Paganini, MD.,have documented all relevant documentation on the behalf of Lavon Paganini, MD,as directed by  Lavon Paganini, MD while in the presence of Lavon Paganini, MD.     Established patient visit   Patient: Alyssa Thornton   DOB: April 05, 1956   66 y.o. Female  MRN: [id number] Visit Date: 03/14/2022  Today's healthcare provider: Lavon Paganini, MD   No chief complaint on file.  Subjective    HPI  Follow up Hospitalization  Patient was admitted to [facility] on 02/23/22 and discharged on 02/24/22. She was treated for diverticulitis. Treatment for this included Zosyn. Latulose. Keflex and Flagyl for additional 10 days. She reports {excellent/good/fair:19992} compliance with treatment. She reports this condition is {resolved/improved/worsened:23923}.  ------------------------------------------------------------------------------------------   Medications: Outpatient Medications Prior to Visit  Medication Sig   Cholecalciferol (VITAMIN D-3 PO) Take 1,000 Int'l Units by mouth in the morning and at bedtime. 2 tabs daily with a meal   fluticasone (FLONASE) 50 MCG/ACT nasal spray SHAKE LIQUID AND USE 1 SPRAY IN EACH NOSTRIL DAILY AS NEEDED   lisinopril-hydrochlorothiazide (ZESTORETIC) 20-25 MG tablet Take 0.5 tablets by mouth daily.   montelukast (SINGULAIR) 10 MG tablet Take 1 tablet (10 mg total) by mouth daily.   senna-docusate (SENOKOT-S) 8.6-50 MG tablet Take 2 tablets by mouth 2 (two) times daily as needed for mild constipation.   vitamin C (ASCORBIC ACID) 500 MG tablet Take 500 mg by mouth daily.   No facility-administered medications prior to visit.    Review of Systems  {Labs  Heme  Chem  Endocrine  Serology  Results Review (optional):23779}   Objective    There were no vitals taken for this visit. BP Readings from Last 3 Encounters:  02/24/22 116/71  02/11/22 106/65  01/25/22 111/71   Wt Readings  from Last 3 Encounters:  02/23/22 140 lb 10.5 oz (63.8 kg)  02/11/22 146 lb 4.8 oz (66.4 kg)  01/25/22 143 lb 9.6 oz (65.1 kg)      Physical Exam  ***  No results found for any visits on 03/14/22.  Assessment & Plan     ***  No follow-ups on file.      {provider attestation***:1}   Lavon Paganini, MD  [facility] [phone] (phone) [phone] (fax)  [place]

## 2022-03-14 ENCOUNTER — Encounter: Payer: Self-pay | Admitting: Family Medicine

## 2022-03-14 ENCOUNTER — Ambulatory Visit (INDEPENDENT_AMBULATORY_CARE_PROVIDER_SITE_OTHER): Payer: Medicare Other | Admitting: Family Medicine

## 2022-03-14 VITALS — BP 118/78 | HR 79 | Temp 98.0°F | Resp 16 | Wt 137.3 lb

## 2022-03-14 DIAGNOSIS — K5792 Diverticulitis of intestine, part unspecified, without perforation or abscess without bleeding: Secondary | ICD-10-CM | POA: Diagnosis not present

## 2022-03-14 DIAGNOSIS — K5900 Constipation, unspecified: Secondary | ICD-10-CM

## 2022-03-14 DIAGNOSIS — Z1231 Encounter for screening mammogram for malignant neoplasm of breast: Secondary | ICD-10-CM

## 2022-03-14 NOTE — Assessment & Plan Note (Signed)
Reviewed labs, CT scan, and notes from hospitalization She seems to be improving with minimal soreness now and resolution of constipation No need for further abx at this time If pain escalates, would repeat CT scan Continue soft diet and avoidance of corn, nuts, seeds and ramp back up diet slowly Return precautions discussed

## 2022-03-14 NOTE — Assessment & Plan Note (Signed)
Resolved Having soft bowel movements 1-2 times daily No longer taking stool softener since her constipation has resolved Needs to stay on top of it as this likely was contributing to her pain from diverticulitis

## 2022-04-10 LAB — HM MAMMOGRAPHY

## 2022-04-23 ENCOUNTER — Encounter: Payer: Self-pay | Admitting: Family Medicine

## 2022-07-11 ENCOUNTER — Telehealth: Payer: Self-pay | Admitting: Family Medicine

## 2022-07-11 NOTE — Telephone Encounter (Signed)
Contacted Alyssa Thornton to schedule their annual wellness visit. Patient declined to schedule AWV at this time.  [place] Direct Dial: [phone]

## 2022-07-17 NOTE — Progress Notes (Unsigned)
I,Yohann Curl S Josphine Laffey,acting as a Neurosurgeon for Shirlee Latch, MD.,have documented all relevant documentation on the behalf of Shirlee Latch, MD,as directed by  Shirlee Latch, MD while in the presence of Shirlee Latch, MD.     Established patient visit   Patient: Alyssa Thornton   DOB: [birth year]   67 y.o. Female  MRN: [id number] Visit Date: 07/18/2022  Today's healthcare provider: Shirlee Latch, MD   Chief Complaint  Patient presents with   Hypertension   Hyperlipidemia   Subjective    HPI  Hypertension, follow-up  BP Readings from Last 3 Encounters:  07/18/22 100/68  03/14/22 118/78  02/24/22 116/71   Wt Readings from Last 3 Encounters:  07/18/22 148 lb 12.8 oz (67.5 kg)  03/14/22 137 lb 4.8 oz (62.3 kg)  02/23/22 140 lb 10.5 oz (63.8 kg)     She was last seen for hypertension 6 months ago.  BP at that visit was 127/82 . Management since that visit includes no changes. She reports excellent compliance with treatment. She is not having side effects.   Outside blood pressures are not being checked.  --------------------------------------------------------------------------------------------------- Lipid/Cholesterol, follow-up  Last Lipid Panel: Lab Results  Component Value Date   CHOL 191 06/14/2021   LDLCALC 109 (H) 06/14/2021   HDL 64 06/14/2021   TRIG 100 06/14/2021    She was last seen for this 6 months ago.  Management since that visit includes no changes.  She reports excellent compliance with treatment. She is not having side effects.   Last metabolic panel Lab Results  Component Value Date   GLUCOSE 105 (H) 02/24/2022   NA 139 02/24/2022   K 3.9 02/24/2022   BUN 14 02/24/2022   CREATININE 0.52 02/24/2022   EGFR 96 01/15/2022   GFRNONAA >60 02/24/2022   CALCIUM 9.1 02/24/2022   AST 20 02/24/2022   ALT 23 02/24/2022   The 10-year ASCVD risk score (Arnett DK, et al., 2019) is:  6%  ---------------------------------------------------------------------------------------------------   Medications: Outpatient Medications Prior to Visit  Medication Sig   Cholecalciferol (VITAMIN D-3 PO) Take 1,000 Int'l Units by mouth in the morning and at bedtime. 2 tabs daily with a meal   lisinopril-hydrochlorothiazide (ZESTORETIC) 20-25 MG tablet Take 0.5 tablets by mouth daily.   montelukast (SINGULAIR) 10 MG tablet Take 1 tablet (10 mg total) by mouth daily.   vitamin C (ASCORBIC ACID) 500 MG tablet Take 500 mg by mouth daily.   [DISCONTINUED] fluticasone (FLONASE) 50 MCG/ACT nasal spray SHAKE LIQUID AND USE 1 SPRAY IN EACH NOSTRIL DAILY AS NEEDED   [DISCONTINUED] senna-docusate (SENOKOT-S) 8.6-50 MG tablet Take 2 tablets by mouth 2 (two) times daily as needed for mild constipation.   No facility-administered medications prior to visit.    Review of Systems  Eyes:  Negative for visual disturbance.  Respiratory:  Negative for cough and shortness of breath.   Cardiovascular:  Negative for chest pain and leg swelling.  Neurological:  Negative for dizziness, light-headedness and headaches.       Objective    BP 100/68 (BP Location: Left Arm, Patient Position: Sitting, Cuff Size: Large)   Pulse 87   Temp 97.6 F (36.4 C) (Temporal)   Resp 12   Wt 148 lb 12.8 oz (67.5 kg)   BMI 26.36 kg/m  BP Readings from Last 3 Encounters:  07/18/22 100/68  03/14/22 118/78  02/24/22 116/71   Wt Readings from Last 3 Encounters:  07/18/22 148 lb 12.8 oz (67.5 kg)  03/14/22 137 lb  4.8 oz (62.3 kg)  02/23/22 140 lb 10.5 oz (63.8 kg)      Physical Exam Vitals reviewed.  Constitutional:      General: She is not in acute distress.    Appearance: Normal appearance. She is well-developed. She is not diaphoretic.  HENT:     Head: Normocephalic and atraumatic.  Eyes:     General: No scleral icterus.    Conjunctiva/sclera: Conjunctivae normal.  Neck:     Thyroid: No thyromegaly.   Cardiovascular:     Rate and Rhythm: Normal rate and regular rhythm.     Pulses: Normal pulses.     Heart sounds: Normal heart sounds. No murmur heard. Pulmonary:     Effort: Pulmonary effort is normal. No respiratory distress.     Breath sounds: Normal breath sounds. No wheezing, rhonchi or rales.  Musculoskeletal:     Cervical back: Neck supple.     Right lower leg: No edema.     Left lower leg: No edema.  Lymphadenopathy:     Cervical: No cervical adenopathy.  Skin:    General: Skin is warm and dry.     Findings: No rash.  Neurological:     Mental Status: She is alert and oriented to person, place, and time. Mental status is at baseline.  Psychiatric:        Mood and Affect: Mood normal.        Behavior: Behavior normal.       No results found for any visits on 07/18/22.  Assessment & Plan     Problem List Items Addressed This Visit       Cardiovascular and Mediastinum   Essential hypertension - Primary    Well controlled Continue current medications Recheck metabolic panel F/u in 6 months       Relevant Orders   Comprehensive metabolic panel     Respiratory   Allergic rhinitis    Chronic and well controlled Continue singulair and nasal spray Will switch flonase to nasonex due to cost        Other   Hyperlipidemia    Reviewed last lipid panel Not currently on a statin Recheck FLP and CMP Discussed diet and exercise       Relevant Orders   Lipid panel   Comprehensive metabolic panel   Prediabetes    Recommend low carb diet Recheck A1c       Relevant Orders   Hemoglobin A1c     Return in about 6 months (around 01/17/2023) for CPE, AWV.      I, Shirlee Latch, MD, have reviewed all documentation for this visit. The documentation on 07/18/22 for the exam, diagnosis, procedures, and orders are all accurate and complete.   Bacigalupo, Marzella Schlein, MD, MPH [facility]

## 2022-07-18 ENCOUNTER — Ambulatory Visit (INDEPENDENT_AMBULATORY_CARE_PROVIDER_SITE_OTHER): Payer: Medicare Other | Admitting: Family Medicine

## 2022-07-18 ENCOUNTER — Encounter: Payer: Self-pay | Admitting: Family Medicine

## 2022-07-18 VITALS — BP 100/68 | HR 87 | Temp 97.6°F | Resp 12 | Wt 148.8 lb

## 2022-07-18 DIAGNOSIS — J301 Allergic rhinitis due to pollen: Secondary | ICD-10-CM

## 2022-07-18 DIAGNOSIS — E782 Mixed hyperlipidemia: Secondary | ICD-10-CM

## 2022-07-18 DIAGNOSIS — R7303 Prediabetes: Secondary | ICD-10-CM

## 2022-07-18 DIAGNOSIS — I10 Essential (primary) hypertension: Secondary | ICD-10-CM

## 2022-07-18 MED ORDER — MOMETASONE FUROATE 50 MCG/ACT NA SUSP: 2.0000 | Freq: Every day | NASAL | 3 refills | Status: DC

## 2022-07-18 NOTE — Assessment & Plan Note (Signed)
Well controlled Continue current medications Recheck metabolic panel F/u in 6 months  

## 2022-07-18 NOTE — Assessment & Plan Note (Signed)
Recommend low carb diet °Recheck A1c  °

## 2022-07-18 NOTE — Assessment & Plan Note (Signed)
Chronic and well controlled Continue singulair and nasal spray Will switch flonase to nasonex due to cost

## 2022-07-18 NOTE — Assessment & Plan Note (Signed)
Reviewed last lipid panel Not currently on a statin Recheck FLP and CMP Discussed diet and exercise  

## 2022-07-19 LAB — LIPID PANEL
Chol/HDL Ratio: 2.8 ratio (ref 0.0–4.4)
Cholesterol, Total: 176 mg/dL (ref 100–199)
HDL: 63 mg/dL (ref >39)
LDL Chol Calc (NIH): 97 mg/dL (ref 0–99)
Triglycerides: 88 mg/dL (ref 0–149)
VLDL Cholesterol Cal: 16 mg/dL (ref 5–40)

## 2022-07-19 LAB — COMPREHENSIVE METABOLIC PANEL
ALT: 30 IU/L (ref 0–32)
AST: 30 IU/L (ref 0–40)
Albumin/Globulin Ratio: 1.9 (ref 1.2–2.2)
Albumin: 4.2 g/dL (ref 3.9–4.9)
Alkaline Phosphatase: 126 IU/L — ABNORMAL HIGH (ref 44–121)
BUN/Creatinine Ratio: 18 (ref 12–28)
BUN: 12 mg/dL (ref 8–27)
Bilirubin Total: 1.3 mg/dL — ABNORMAL HIGH (ref 0.0–1.2)
CO2: 23 mmol/L (ref 20–29)
Calcium: 9.3 mg/dL (ref 8.7–10.3)
Chloride: 105 mmol/L (ref 96–106)
Creatinine, Ser: 0.66 mg/dL (ref 0.57–1.00)
Globulin, Total: 2.2 g/dL (ref 1.5–4.5)
Glucose: 108 mg/dL — ABNORMAL HIGH (ref 70–99)
Potassium: 4.3 mmol/L (ref 3.5–5.2)
Sodium: 141 mmol/L (ref 134–144)
Total Protein: 6.4 g/dL (ref 6.0–8.5)
eGFR: 95 mL/min/{1.73_m2} (ref >59)

## 2022-07-19 LAB — HEMOGLOBIN A1C
Est. average glucose Bld gHb Est-mCnc: 126 mg/dL
Hgb A1c MFr Bld: 6 % — ABNORMAL HIGH (ref 4.8–5.6)

## 2022-08-16 ENCOUNTER — Telehealth: Payer: Self-pay | Admitting: Family Medicine

## 2022-08-16 MED ORDER — LISINOPRIL-HYDROCHLOROTHIAZIDE 20-25 MG PO TABS: 0.5000 | ORAL_TABLET | Freq: Every day | ORAL | 3 refills | Status: DC

## 2022-08-16 NOTE — Telephone Encounter (Signed)
[facility] requesting prescription refill lisinopril-hydrochlorothiazide (ZESTORETIC) 20-25 MG tablet   Please advise

## 2022-08-19 ENCOUNTER — Telehealth: Payer: Self-pay

## 2022-08-19 NOTE — Telephone Encounter (Signed)
Noted  

## 2022-08-19 NOTE — Telephone Encounter (Signed)
Copied from CRM [id number]. Topic: General - Other >> Aug 19, 2022 10:19 AM Carrielelia G wrote: Reason for CRM: please let doctor know i went and purchased flonase

## 2023-01-23 ENCOUNTER — Encounter: Payer: Self-pay | Admitting: Family Medicine

## 2023-01-23 NOTE — Progress Notes (Signed)
Established Patient Office Visit  Subjective   Patient ID: Alyssa Thornton, female    DOB: 03-Oct-1955  Age: 67 y.o. MRN: [id number]  Chief Complaint  Patient presents with   Follow-up    6 mo f/u     HPI  Discussed the use of AI scribe software for clinical note transcription with the patient, who gave verbal consent to proceed.  History of Present Illness   The patient, a retiree with a history of hypertension managed with Lisinopril and Hydrochlorothiazide, presents with concerns about hemorrhoids and weight loss. The patient reports a history of hospitalization for suspected diverticulitis, which was later attributed to constipation. Following this hospitalization, the patient experienced prolonged issues with hemorrhoids, which they suspect may have been exacerbated by the aggressive bowel regimen during their hospital stay. The patient reports that the hemorrhoids have since improved.  The patient also expresses frustration with unsuccessful weight loss attempts despite regular exercise, including walking 3-4 miles daily on a treadmill. The patient's weight has remained stable around 150 pounds. The patient is seeking advice on how to effectively lose weight.  The patient also mentions upcoming travel plans, indicating a generally active lifestyle. The patient is due for a colonoscopy next year and is currently on a medication regimen that includes Lisinopril, Hydrochlorothiazide, and Flonase.         ROS    Objective:     BP 117/78 (BP Location: Left Arm, Patient Position: Sitting, Cuff Size: Normal)   Pulse 91   Temp 97.7 F (36.5 C)   Resp 20   Ht 5\' 2"  (1.575 m)   Wt 151 lb (68.5 kg)   SpO2 98%   BMI 27.62 kg/m    Physical Exam Vitals reviewed.  Constitutional:      General: She is not in acute distress.    Appearance: Normal appearance. She is well-developed. She is not diaphoretic.  HENT:     Head: Normocephalic and atraumatic.  Eyes:     General: No  scleral icterus.    Conjunctiva/sclera: Conjunctivae normal.  Neck:     Thyroid: No thyromegaly.  Cardiovascular:     Rate and Rhythm: Normal rate and regular rhythm.     Heart sounds: Normal heart sounds. No murmur heard. Pulmonary:     Effort: Pulmonary effort is normal. No respiratory distress.     Breath sounds: Normal breath sounds. No wheezing, rhonchi or rales.  Musculoskeletal:     Cervical back: Neck supple.     Right lower leg: No edema.     Left lower leg: No edema.  Lymphadenopathy:     Cervical: No cervical adenopathy.  Skin:    General: Skin is warm and dry.     Findings: No rash.  Neurological:     Mental Status: She is alert and oriented to person, place, and time. Mental status is at baseline.  Psychiatric:        Mood and Affect: Mood normal.        Behavior: Behavior normal.      No results found for any visits on 01/24/23.    The 10-year ASCVD risk score (Arnett DK, et al., 2019) is: 8.9%    Assessment & Plan:   Problem List Items Addressed This Visit       Cardiovascular and Mediastinum   Essential hypertension - Primary    Well controlled on Lisinopril-Hydrochlorothiazide 0.5 tablet daily. -Continue current medication. -Recheck blood pressure, kidney and liver function, blood count, cholesterol, and A1C  today.        Hematopoietic and Hemostatic   Thrombocytopenia (HCC)    Normal on last CBC Continue to monitor CBC annually      Relevant Orders   CBC w/Diff/Platelet     Other   Hyperlipidemia    Reviewed last lipid panel Not currently on a statin Recheck FLP and CMP Discussed diet and exercise       Relevant Orders   Comprehensive metabolic panel   Lipid panel   Prediabetes    Recommend low carb diet Recheck A1c       Relevant Orders   Hemoglobin A1c   Overweight    Discussed importance of healthy weight management Discussed diet and exercise       Other Visit Diagnoses     Colon cancer screening       Relevant  Orders   Ambulatory referral to Gastroenterology           Hemorrhoids History of hemorrhoids, currently asymptomatic. -No intervention needed at this time. -Plan for routine colonoscopy in spring 2025, referral to GI for scheduling.  Weight Management Difficulty losing weight despite daily treadmill exercise. -Recommend adding weight lifting to exercise routine to increase muscle mass and metabolism.  Allergic Rhinitis Current treatment with over-the-counter Flonase not covered by insurance. -Continue current treatment. -Consider trying to fill prescription if insurance changes during open enrollment.  Gynecological Health Due for routine gynecological exam. -Perform gynecological exam today.  General Health Maintenance / Followup Plans -Continue current exercise and dietary habits. -Schedule follow-up visit in six months (April 2025). -Perform labs today to monitor platelets, kidney and liver function, cholesterol, and A1C. -Plan for routine colonoscopy in spring 2025.       Return in about 6 months (around 07/25/2023) for chronic disease f/u.    Shirlee Latch, MD

## 2023-01-24 ENCOUNTER — Ambulatory Visit (INDEPENDENT_AMBULATORY_CARE_PROVIDER_SITE_OTHER): Payer: Medicare Other | Admitting: Family Medicine

## 2023-01-24 VITALS — BP 117/78 | HR 91 | Temp 97.7°F | Resp 20 | Ht 62.0 in | Wt 151.0 lb

## 2023-01-24 DIAGNOSIS — D696 Thrombocytopenia, unspecified: Secondary | ICD-10-CM

## 2023-01-24 DIAGNOSIS — Z1211 Encounter for screening for malignant neoplasm of colon: Secondary | ICD-10-CM

## 2023-01-24 DIAGNOSIS — R7303 Prediabetes: Secondary | ICD-10-CM | POA: Diagnosis not present

## 2023-01-24 DIAGNOSIS — E663 Overweight: Secondary | ICD-10-CM

## 2023-01-24 DIAGNOSIS — E782 Mixed hyperlipidemia: Secondary | ICD-10-CM | POA: Diagnosis not present

## 2023-01-24 DIAGNOSIS — I10 Essential (primary) hypertension: Secondary | ICD-10-CM | POA: Diagnosis not present

## 2023-01-24 DIAGNOSIS — Z23 Encounter for immunization: Secondary | ICD-10-CM

## 2023-01-24 NOTE — Assessment & Plan Note (Signed)
Discussed importance of healthy weight management Discussed diet and exercise  

## 2023-01-24 NOTE — Assessment & Plan Note (Signed)
Normal on last CBC Continue to monitor CBC annually

## 2023-01-24 NOTE — Assessment & Plan Note (Signed)
Recommend low carb diet °Recheck A1c  °

## 2023-01-24 NOTE — Assessment & Plan Note (Signed)
Reviewed last lipid panel Not currently on a statin Recheck FLP and CMP Discussed diet and exercise  

## 2023-01-24 NOTE — Assessment & Plan Note (Signed)
Well controlled on Lisinopril-Hydrochlorothiazide 0.5 tablet daily. -Continue current medication. -Recheck blood pressure, kidney and liver function, blood count, cholesterol, and A1C today.

## 2023-01-25 LAB — CBC WITH DIFFERENTIAL/PLATELET
Basophils Absolute: 0 10*3/uL (ref 0.0–0.2)
Basos: 1 %
EOS (ABSOLUTE): 0.1 10*3/uL (ref 0.0–0.4)
Eos: 1 %
Hematocrit: 42.8 % (ref 34.0–46.6)
Hemoglobin: 14.5 g/dL (ref 11.1–15.9)
Immature Grans (Abs): 0 10*3/uL (ref 0.0–0.1)
Immature Granulocytes: 0 %
Lymphocytes Absolute: 1.3 10*3/uL (ref 0.7–3.1)
Lymphs: 32 %
MCH: 31.5 pg (ref 26.6–33.0)
MCHC: 33.9 g/dL (ref 31.5–35.7)
MCV: 93 fL (ref 79–97)
Monocytes Absolute: 0.3 10*3/uL (ref 0.1–0.9)
Monocytes: 8 %
Neutrophils Absolute: 2.4 10*3/uL (ref 1.4–7.0)
Neutrophils: 58 %
Platelets: 136 10*3/uL — ABNORMAL LOW (ref 150–450)
RBC: 4.61 x10E6/uL (ref 3.77–5.28)
RDW: 11.9 % (ref 11.7–15.4)
WBC: 4.1 10*3/uL (ref 3.4–10.8)

## 2023-01-25 LAB — COMPREHENSIVE METABOLIC PANEL
ALT: 27 [IU]/L (ref 0–32)
AST: 28 [IU]/L (ref 0–40)
Albumin: 4.4 g/dL (ref 3.9–4.9)
Alkaline Phosphatase: 135 [IU]/L — ABNORMAL HIGH (ref 44–121)
BUN/Creatinine Ratio: 19 (ref 12–28)
BUN: 13 mg/dL (ref 8–27)
Bilirubin Total: 1.5 mg/dL — ABNORMAL HIGH (ref 0.0–1.2)
CO2: 23 mmol/L (ref 20–29)
Calcium: 9.1 mg/dL (ref 8.7–10.3)
Chloride: 104 mmol/L (ref 96–106)
Creatinine, Ser: 0.69 mg/dL (ref 0.57–1.00)
Globulin, Total: 2.1 g/dL (ref 1.5–4.5)
Glucose: 119 mg/dL — ABNORMAL HIGH (ref 70–99)
Potassium: 4.3 mmol/L (ref 3.5–5.2)
Sodium: 144 mmol/L (ref 134–144)
Total Protein: 6.5 g/dL (ref 6.0–8.5)
eGFR: 94 mL/min/{1.73_m2} (ref >59)

## 2023-01-25 LAB — LIPID PANEL
Chol/HDL Ratio: 2.9 {ratio} (ref 0.0–4.4)
Cholesterol, Total: 177 mg/dL (ref 100–199)
HDL: 62 mg/dL (ref >39)
LDL Chol Calc (NIH): 100 mg/dL — ABNORMAL HIGH (ref 0–99)
Triglycerides: 79 mg/dL (ref 0–149)
VLDL Cholesterol Cal: 15 mg/dL (ref 5–40)

## 2023-01-25 LAB — HEMOGLOBIN A1C
Est. average glucose Bld gHb Est-mCnc: 117 mg/dL
Hgb A1c MFr Bld: 5.7 % — ABNORMAL HIGH (ref 4.8–5.6)

## 2023-02-03 ENCOUNTER — Encounter: Payer: Self-pay | Admitting: *Deleted

## 2023-02-06 ENCOUNTER — Telehealth: Payer: Self-pay

## 2023-02-06 NOTE — Telephone Encounter (Signed)
Patient called in wanting to schedule her repeat colonoscopy. I read her the message Johny Drilling sent out but the patient has more question for the scheduler

## 2023-02-06 NOTE — Telephone Encounter (Signed)
Spoken to patient, inform patient when recommendation repeat as done. Suggested for patient to contact insurance to get information if needed. Patient will call closer to March 2025 to schedule colonoscopy.

## 2023-02-10 ENCOUNTER — Other Ambulatory Visit: Payer: Self-pay | Admitting: Family Medicine

## 2023-02-10 DIAGNOSIS — J302 Other seasonal allergic rhinitis: Secondary | ICD-10-CM

## 2023-03-20 ENCOUNTER — Ambulatory Visit (INDEPENDENT_AMBULATORY_CARE_PROVIDER_SITE_OTHER): Payer: Medicare Other | Admitting: Physician Assistant

## 2023-03-20 VITALS — BP 124/82 | HR 77 | Temp 97.7°F | Resp 16 | Ht 63.0 in | Wt 153.7 lb

## 2023-03-20 DIAGNOSIS — Z1211 Encounter for screening for malignant neoplasm of colon: Secondary | ICD-10-CM

## 2023-03-20 DIAGNOSIS — J018 Other acute sinusitis: Secondary | ICD-10-CM

## 2023-03-20 NOTE — Progress Notes (Signed)
Established patient visit  Patient: Alyssa Thornton   DOB: 02-11-1956   67 y.o. Female  MRN: [id number] Visit Date: 03/20/2023  Today's healthcare provider: Debera Lat, PA-C   Chief Complaint  Patient presents with   Sinus Problem    Wk 1/2 a sinus infection, spot of blood in the morning when patient blows nose.pressure in head and headache   Subjective       Discussed the use of AI scribe software for clinical note transcription with the patient, who gave verbal consent to proceed.  History of Present Illness   The patient presents with a 3-4 day history of sinus infection symptoms, including pressure in the head, headaches, and nasal congestion. They report a tinge of blood in their nasal discharge yesterday morning. They have been self-treating with Flonase and Claritin, which led to the bloody discharge. The patient denies earache and sore throat, but reports feeling congested, particularly at night. They deny any exposure to COVID-19 and have not previously had the virus. They report seasonal winter allergies and have previously responded well to Flonase. The patient denies any hearing changes, but reports a sensation of pressure in their ears. They deny any postnasal drainage or pain in their legs. They report occasional coughing, primarily at night and in the morning. The patient denies smoking and fever. They have been taking multivitamins and drinking plenty of water.           03/20/2023    8:33 AM 01/24/2023    9:21 AM 07/18/2022   11:08 AM  Depression screen PHQ 2/9  Decreased Interest 0 0 0  Down, Depressed, Hopeless 0 0 0  PHQ - 2 Score 0 0 0  Altered sleeping   0  Tired, decreased energy   0  Change in appetite   0  Feeling bad or failure about yourself    0  Trouble concentrating   0  Moving slowly or fidgety/restless   0  Suicidal thoughts   0  PHQ-9 Score   0  Difficult doing work/chores   Not difficult at all       No data to display           Medications: Outpatient Medications Prior to Visit  Medication Sig   Cholecalciferol (VITAMIN D-3 PO) Take 1,000 Int'l Units by mouth in the morning and at bedtime. 2 tabs daily with a meal   fluticasone (FLONASE) 50 MCG/ACT nasal spray Place into both nostrils daily.   lisinopril-hydrochlorothiazide (ZESTORETIC) 20-25 MG tablet Take 0.5 tablets by mouth daily.   montelukast (SINGULAIR) 10 MG tablet TAKE 1 TABLET(10 MG) BY MOUTH DAILY   vitamin C (ASCORBIC ACID) 500 MG tablet Take 500 mg by mouth daily.   No facility-administered medications prior to visit.    Review of Systems All negative Except see HPI       Objective    BP 124/82 (BP Location: Right Arm, Patient Position: Sitting, Cuff Size: Normal)   Pulse 77   Temp 97.7 F (36.5 C)   Resp 16   Ht 5\' 3"  (1.6 m)   Wt 153 lb 11.2 oz (69.7 kg)   SpO2 99%   BMI 27.23 kg/m     Physical Exam Vitals reviewed.  Constitutional:      General: She is not in acute distress.    Appearance: Normal appearance. She is well-developed. She is not diaphoretic.  HENT:     Head: Normocephalic and atraumatic.     Right Ear: Ear canal  and external ear normal.     Left Ear: Ear canal and external ear normal.     Nose: Congestion and rhinorrhea present.     Mouth/Throat:     Pharynx: No posterior oropharyngeal erythema.  Eyes:     General: No scleral icterus.       Right eye: No discharge.        Left eye: No discharge.     Extraocular Movements: Extraocular movements intact.     Conjunctiva/sclera: Conjunctivae normal.     Pupils: Pupils are equal, round, and reactive to light.  Neck:     Thyroid: No thyromegaly.  Cardiovascular:     Rate and Rhythm: Normal rate and regular rhythm.     Pulses: Normal pulses.     Heart sounds: Normal heart sounds. No murmur heard. Pulmonary:     Effort: Pulmonary effort is normal. No respiratory distress.     Breath sounds: Normal breath sounds. No wheezing, rhonchi or rales.   Musculoskeletal:     Cervical back: Normal range of motion and neck supple. No tenderness.     Right lower leg: No edema.     Left lower leg: No edema.  Lymphadenopathy:     Cervical: No cervical adenopathy.  Skin:    General: Skin is warm and dry.     Findings: No rash.  Neurological:     Mental Status: She is alert and oriented to person, place, and time. Mental status is at baseline.  Psychiatric:        Mood and Affect: Mood normal.        Behavior: Behavior normal.      No results found for any visits on 03/20/23.      Assessment and Plan    Sinusitis Complaints of sinus pressure, headaches, and blood-tinged mucus. No fever or other systemic symptoms. Likely viral etiology given the clinical presentation and time course. -Continue Flonase and Claritin. -Try Zyrtec samples for more severe symptoms. -Use nasal saline rinse or spray before Flonase to ensure proper distribution. -Consider Mucinex if cough develops. -Stay hydrated and use a humidifier. -Return if symptoms persist for more than 10 days or if there is a sudden worsening of symptoms, which could indicate a bacterial infection.  Seasonal Allergies History of winter allergies. -Continue current allergy medications and consider transitioning to Zyrtec if symptoms persist.  General Health Maintenance -Continue multivitamins. -Consider Zicam or vitamin C for immune support. -Consider COVID testing if symptoms change or if there is a known exposure.      No orders of the defined types were placed in this encounter.   No follow-ups on file.   The patient was advised to call back or seek an in-person evaluation if the symptoms worsen or if the condition fails to improve as anticipated.  I discussed the assessment and treatment plan with the patient. The patient was provided an opportunity to ask questions and all were answered. The patient agreed with the plan and demonstrated an understanding of the  instructions.  I, Debera Lat, PA-C have reviewed all documentation for this visit. The documentation on 03/20/2023  for the exam, diagnosis, procedures, and orders are all accurate and complete.  Debera Lat, [facility], MMS [facility] [phone] (phone) [phone] (fax)  [facility]

## 2023-03-21 ENCOUNTER — Encounter: Payer: Self-pay | Admitting: Physician Assistant

## 2023-05-14 ENCOUNTER — Telehealth: Payer: Self-pay | Admitting: Gastroenterology

## 2023-05-14 NOTE — Telephone Encounter (Signed)
 The patient called in to schedule a repeat colonoscopy.

## 2023-05-14 NOTE — Telephone Encounter (Signed)
 Message left for patient to return my call.

## 2023-05-19 ENCOUNTER — Telehealth: Payer: Self-pay | Admitting: *Deleted

## 2023-05-19 ENCOUNTER — Other Ambulatory Visit: Payer: Self-pay | Admitting: *Deleted

## 2023-05-19 DIAGNOSIS — Z1211 Encounter for screening for malignant neoplasm of colon: Secondary | ICD-10-CM

## 2023-05-19 MED ORDER — NA SULFATE-K SULFATE-MG SULF 17.5-3.13-1.6 GM/177ML PO SOLN: 1.0000 | Freq: Once | ORAL | 0 refills | Status: AC

## 2023-05-19 NOTE — Telephone Encounter (Signed)
 Gastroenterology Pre-Procedure Review  Request Date: 07/03/2023 Requesting Physician: Dr. Ole Berkeley  PATIENT REVIEW QUESTIONS: The patient responded to the following health history questions as indicated:    1. Are you having any GI issues? no 2. Do you have a personal history of Polyps? no 3. Do you have a family history of Colon Cancer or Polyps? no 4. Diabetes Mellitus? no 5. Joint replacements in the past 12 months?no 6. Major health problems in the past 3 months?no 7. Any artificial heart valves, MVP, or defibrillator?no    MEDICATIONS & ALLERGIES:    Patient reports the following regarding taking any anticoagulation/antiplatelet therapy:   Plavix, Coumadin, Eliquis, Xarelto, Lovenox , Pradaxa, Brilinta, or Effient? no Aspirin? no  Patient confirms/reports the following medications:  Current Outpatient Medications  Medication Sig Dispense Refill   Na Sulfate-K Sulfate-Mg Sulfate concentrate 17.5-3.13-1.6 GM/177ML SOLN Take 1 kit by mouth once for 1 dose. 354 mL 0   Cholecalciferol (VITAMIN D-3 PO) Take 1,000 Int'l Units by mouth in the morning and at bedtime. 2 tabs daily with a meal     fluticasone  (FLONASE ) 50 MCG/ACT nasal spray Place into both nostrils daily.     lisinopril -hydrochlorothiazide  (ZESTORETIC ) 20-25 MG tablet Take 0.5 tablets by mouth daily. 45 tablet 3   montelukast  (SINGULAIR ) 10 MG tablet TAKE 1 TABLET(10 MG) BY MOUTH DAILY 90 tablet 3   vitamin C (ASCORBIC ACID) 500 MG tablet Take 500 mg by mouth daily.     No current facility-administered medications for this visit.    Patient confirms/reports the following allergies:  Allergies  Allergen Reactions   Levofloxacin Nausea And Vomiting    Other reaction(s): Vomiting   Diclofenac Sodium Diarrhea   Tramadol Diarrhea    No orders of the defined types were placed in this encounter.   AUTHORIZATION INFORMATION Primary Insurance: 1D#: Group #:  Secondary Insurance: 1D#: Group #:  SCHEDULE  INFORMATION: Date: 07/03/2023 Time: Location:  [facility]

## 2023-06-04 ENCOUNTER — Telehealth: Payer: Self-pay | Admitting: *Deleted

## 2023-06-04 NOTE — Telephone Encounter (Signed)
 Patient called office, in need of rescheduling her colonoscopy.  Requesting to change to 07/15/2023. Called endo unit to make the change.  Also left a sample on Clenpiq due to patient wanted less sodium in the prep solution because she did not want to take the sodium that are in [employer]

## 2023-07-01 ENCOUNTER — Ambulatory Visit (INDEPENDENT_AMBULATORY_CARE_PROVIDER_SITE_OTHER): Payer: Medicare Other | Admitting: Dermatology

## 2023-07-01 DIAGNOSIS — Z1283 Encounter for screening for malignant neoplasm of skin: Secondary | ICD-10-CM | POA: Diagnosis not present

## 2023-07-01 DIAGNOSIS — L814 Other melanin hyperpigmentation: Secondary | ICD-10-CM | POA: Diagnosis not present

## 2023-07-01 DIAGNOSIS — L570 Actinic keratosis: Secondary | ICD-10-CM

## 2023-07-01 DIAGNOSIS — Z7189 Other specified counseling: Secondary | ICD-10-CM

## 2023-07-01 DIAGNOSIS — W908XXA Exposure to other nonionizing radiation, initial encounter: Secondary | ICD-10-CM

## 2023-07-01 DIAGNOSIS — Z86018 Personal history of other benign neoplasm: Secondary | ICD-10-CM

## 2023-07-01 DIAGNOSIS — L821 Other seborrheic keratosis: Secondary | ICD-10-CM

## 2023-07-01 DIAGNOSIS — L719 Rosacea, unspecified: Secondary | ICD-10-CM

## 2023-07-01 DIAGNOSIS — D1801 Hemangioma of skin and subcutaneous tissue: Secondary | ICD-10-CM

## 2023-07-01 DIAGNOSIS — L578 Other skin changes due to chronic exposure to nonionizing radiation: Secondary | ICD-10-CM

## 2023-07-01 DIAGNOSIS — L918 Other hypertrophic disorders of the skin: Secondary | ICD-10-CM | POA: Diagnosis not present

## 2023-07-01 DIAGNOSIS — L853 Xerosis cutis: Secondary | ICD-10-CM

## 2023-07-01 DIAGNOSIS — D229 Melanocytic nevi, unspecified: Secondary | ICD-10-CM

## 2023-07-01 DIAGNOSIS — Z79899 Other long term (current) drug therapy: Secondary | ICD-10-CM

## 2023-07-01 DIAGNOSIS — B351 Tinea unguium: Secondary | ICD-10-CM

## 2023-07-01 MED ORDER — FLUOROURACIL 5 % EX CREA: TOPICAL_CREAM | CUTANEOUS | 0 refills | Status: AC

## 2023-07-01 MED ORDER — JUBLIA 10 % EX SOLN: CUTANEOUS | 11 refills | Status: AC

## 2023-07-01 NOTE — Progress Notes (Signed)
 New Patient Visit   Subjective  Alyssa Thornton is a 68 y.o. female who presents for the following: Skin Cancer Screening and Full Body Skin Exam  The patient presents for Total-Body Skin Exam (TBSE) for skin cancer screening and mole check. The patient has spots, moles and lesions to be evaluated, some may be new or changing and the patient may have concern these could be cancer.   The following portions of the chart were reviewed this encounter and updated as appropriate: medications, allergies, medical history  Review of Systems:  No other skin or systemic complaints except as noted in HPI or Assessment and Plan.  Objective  Well appearing patient in no apparent distress; mood and affect are within normal limits.  A full examination was performed including scalp, head, eyes, ears, nose, lips, neck, chest, axillae, abdomen, back, buttocks, bilateral upper extremities, bilateral lower extremities, hands, feet, fingers, toes, fingernails, and toenails. All findings within normal limits unless otherwise noted below.   Relevant physical exam findings are noted in the Assessment and Plan.  Nose, ears, R forearm, cheeks Erythematous thin papules/macules with gritty scale.  R lower axilla x 1 Fleshy, skin-colored pedunculated papules.   L crown x 1, sternum x 1, L med breast x 1, L eyebrow x 1, L forearm below elbow x 1 (5) Pink/tan keratotic macules.       Assessment & Plan   SKIN CANCER SCREENING PERFORMED TODAY.  ACTINIC DAMAGE WITH PRECANCEROUS ACTINIC KERATOSES Counseling for Topical Chemotherapy Management: Patient exhibits: - Severe, confluent actinic changes with pre-cancerous actinic keratoses that is secondary to cumulative UV radiation exposure over time - Condition that is severe; chronic, not at goal. - diffuse scaly erythematous macules and papules with underlying dyspigmentation - Discussed Prescription "Field Treatment" topical Chemotherapy for Severe, Chronic  Confluent Actinic Changes with Pre-Cancerous Actinic Keratoses Field treatment involves treatment of an entire area of skin that has confluent Actinic Changes (Sun/ Ultraviolet light damage) and PreCancerous Actinic Keratoses by method of PhotoDynamic Therapy (PDT) and/or prescription Topical Chemotherapy agents such as 5-fluorouracil, 5-fluorouracil/calcipotriene, and/or imiquimod.  The purpose is to decrease the number of clinically evident and subclinical PreCancerous lesions to prevent progression to development of skin cancer by chemically destroying early precancer changes that may or may not be visible.  It has been shown to reduce the risk of developing skin cancer in the treated area. As a result of treatment, redness, scaling, crusting, and open sores may occur during treatment course. One or more than one of these methods may be used and may have to be used several times to control, suppress and eliminate the PreCancerous changes. Discussed treatment course, expected reaction, and possible side effects. - Recommend daily broad spectrum sunscreen SPF 30+ to sun-exposed areas, reapply every 2 hours as needed.  - Staying in the shade or wearing long sleeves, sun glasses (UVA+UVB protection) and wide brim hats (4-inch brim around the entire circumference of the hat) are also recommended. - Call for new or changing lesions.  Start 5FU/Calcipotriene cream BID x 7 days to the nose and ears and R forearm.   LENTIGINES, SEBORRHEIC KERATOSES, HEMANGIOMAS - Benign normal skin lesions - Benign-appearing - Call for any changes  MELANOCYTIC NEVI - Tan-brown and/or pink-flesh-colored symmetric macules and papules - Benign appearing on exam today - Observation - Call clinic for new or changing moles - Recommend daily use of broad spectrum spf 30+ sunscreen to sun-exposed areas.   HISTORY OF DYSPLASTIC NEVUS No evidence of  recurrence today Recommend regular full body skin exams Recommend daily broad  spectrum sunscreen SPF 30+ to sun-exposed areas, reapply every 2 hours as needed.  Call if any new or changing lesions are noted between office visits  ROSACEA  Exam: Mid face erythema with telangiectasias malar cheeks and nose  Chronic and persistent condition with duration or expected duration over one year. Condition is symptomatic/ bothersome to patient. Not currently at goal.;  Rosacea is a chronic progressive skin condition usually affecting the face of adults, causing redness and/or acne bumps. It is treatable but not curable. It sometimes affects the eyes (ocular rosacea) as well. It may respond to topical and/or systemic medication and can flare with stress, sun exposure, alcohol, exercise, topical steroids (including hydrocortisone/cortisone 10) and some foods.  Daily application of broad spectrum spf 30+ sunscreen to face is recommended to reduce flares.  Treatment Plan pt has tried and failed Metronidazole Patient deferred treatment at this time.  AK (ACTINIC KERATOSIS) Nose, ears, R forearm, cheeks Actinic keratoses are precancerous spots that appear secondary to cumulative UV radiation exposure/sun exposure over time. They are chronic with expected duration over 1 year. A portion of actinic keratoses will progress to squamous cell carcinoma of the skin. It is not possible to reliably predict which spots will progress to skin cancer and so treatment is recommended to prevent development of skin cancer.  Recommend daily broad spectrum sunscreen SPF 30+ to sun-exposed areas, reapply every 2 hours as needed.  Recommend staying in the shade or wearing long sleeves, sun glasses (UVA+UVB protection) and wide brim hats (4-inch brim around the entire circumference of the hat). Call for new or changing lesions.  Start 5FU/Calcipotriene cream BID x 7 days to the nose and ears and R forearm.  Counseling: 5-fluorouracil/calcipotriene cream is is a type of field treatment used to treat  precancers, thin skin cancers, and areas of sun damage. Expected reaction includes irritation and mild inflammation potentially progressing to more severe inflammation including redness, scaling, crusting and open sores/erosions.  If too much irritation occurs, ensure application of only a thin layer and decrease frequency of use to achieve a tolerable level of inflammation. Recommend applying Vaseline ointment to open sores as needed.  Minimize sun exposure while under treatment. Recommend daily broad spectrum sunscreen SPF 30+ to sun-exposed areas, reapply every 2 hours as needed.      SKIN TAG R lower axilla x 1 Symptomatic, irritating, patient would like treated.  Destruction of lesion - R lower axilla x 1  Destruction method: cryotherapy   Informed consent: discussed and consent obtained   Lesion destroyed using liquid nitrogen: Yes   Region frozen until ice ball extended beyond lesion: Yes   Outcome: patient tolerated procedure well with no complications   Post-procedure details: wound care instructions given   Additional details:  Prior to procedure, discussed risks of blister formation, small wound, skin dyspigmentation, or rare scar following cryotherapy. Recommend Vaseline ointment to treated areas while healing.  KERATOSIS, ACTINIC (5) L crown x 1, sternum x 1, L med breast x 1, L eyebrow x 1, L forearm below elbow x 1 (5) Vs ISK  Destruction of lesion - L crown x 1, sternum x 1, L med breast x 1, L eyebrow x 1, L forearm below elbow x 1 (5)  Destruction method: cryotherapy   Informed consent: discussed and consent obtained   Lesion destroyed using liquid nitrogen: Yes   Region frozen until ice ball extended beyond lesion: Yes  Outcome: patient tolerated procedure well with no complications   Post-procedure details: wound care instructions given   Additional details:  Prior to procedure, discussed risks of blister formation, small wound, skin dyspigmentation, or rare scar  following cryotherapy. Recommend Vaseline ointment to treated areas while healing.   TINEA Unguium  Exam: distal yellow white onycholysis at L >R great toenail with subungual debris, photos today   Chronic and persistent condition with duration or expected duration over one year. Condition is bothersome/symptomatic for patient. Currently flared.   Treatment Plan: Discuss oral and topical treatment. Pt prefers oral. Start Jublia solution to aa nail QHS.  SEBORRHEIC KERATOSIS - Stuck-on, waxy, tan-brown papules and/or plaques - R lat palm - Benign-appearing - Discussed benign etiology and prognosis. - Observe - Call for any changes  Xerosis - diffuse xerotic patches - recommend gentle, hydrating skin care - gentle skin care handout given  Return in about 1 year (around 06/30/2024) for TBSE; 6 mths AK .  Maylene Roes, CMA, am acting as scribe for Willeen Niece, MD .   Documentation: I have reviewed the above documentation for accuracy and completeness, and I agree with the above.  Willeen Niece, MD

## 2023-07-01 NOTE — Patient Instructions (Addendum)
 Gentle Skin Care Guide  1. Bathe no more than once a day.  2. Avoid bathing in hot water  3. Use a mild soap like Dove, Vanicream, Cetaphil, CeraVe. Can use Lever 2000 or Cetaphil antibacterial soap  4. Use soap only where you need it. On most days, use it under your arms, between your legs, and on your feet. Let the water rinse other areas unless visibly dirty.  5. When you get out of the bath/shower, use a towel to gently blot your skin dry, don't rub it.  6. While your skin is still a little damp, apply a moisturizing cream such as Vanicream, CeraVe, Cetaphil, Eucerin, Sarna lotion or plain Vaseline Jelly. For hands apply Neutrogena [place] Hand Cream or Excipial Hand Cream.  7. Reapply moisturizer any time you start to itch or feel dry.  8. Sometimes using free and clear laundry detergents can be helpful. Fabric softener sheets should be avoided. Downy Free & Gentle liquid, or any liquid fabric softener that is free of dyes and perfumes, it acceptable to use  9. If your doctor has given you prescription creams you may apply moisturizers over them     Instructions for [facility] Medications  One or more of your medications was sent to the [facility] mail order compounding pharmacy. You will receive an email from them and can purchase the medicine through that link. It will then be mailed to your home at the address you confirmed. If for any reason you do not receive an email from them, please check your spam folder. If you still do not find the email, please let us know. [facility] phone number is [phone].  5-Fluorouracil/Calcipotriene Patient Education   Actinic keratoses are the dry, red scaly spots on the skin caused by sun damage. A portion of these spots can turn into skin cancer with time, and treating them can help prevent development of skin cancer.   Treatment of these spots requires removal of the defective skin cells. There are various ways to  remove actinic keratoses, including freezing with liquid nitrogen, treatment with creams, or treatment with a blue light procedure in the office.   5-fluorouracil cream is a topical cream used to treat actinic keratoses. It works by interfering with the growth of abnormal fast-growing skin cells, such as actinic keratoses. These cells peel off and are replaced by healthy ones. THIS CREAM SHOULD BE KEPT OUT OF REACH OF CHILDREN AND PETS AND SHOULD NOT BE USED BY PREGNANT WOMEN.  5-fluorouracil/calcipotriene is a combination of the 5-fluorouracil cream with a vitamin D analog cream called calcipotriene. The calcipotriene alone does not treat actinic keratoses. However, when it is combined with 5-fluorouracil, it helps the 5-fluorouracil treat the actinic keratoses much faster so that the same results can be achieved with a much shorter treatment time.  INSTRUCTIONS FOR 5-FLUOROURACIL/CALCIPOTRIENE CREAM:   5-fluorouracil/calcipotriene cream typically only needs to be used for 4-7 days. A thin layer should be applied twice a day to the treatment areas recommended by your physician.   If your physician prescribed you separate tubes of 5-fluourouracil and calcipotriene, apply a thin layer of 5-fluorouracil followed by a thin layer of calcipotriene.   Avoid contact with your eyes or nostrils. Avoid applying the cream to your eyelids or lips unless directed to apply there by your physician. Do not use 5-fluorouracil/calcipotriene cream on infected or open wounds.   You will develop redness, irritation and some crusting at areas where you have pre-cancer damage/actinic keratoses.  IF YOU DEVELOP PAIN, BLEEDING, OR SIGNIFICANT CRUSTING, STOP THE TREATMENT EARLY - you have already gotten a good response and the actinic keratoses should clear up well.  Wash your hands after applying 5-fluorouracil 5% cream on your skin.   A moisturizer or sunscreen with a minimum SPF 30 should be applied each morning.   Once  you have finished the treatment, you can apply a thin layer of Vaseline twice a day to irritated areas to soothe and calm the areas more quickly. If you experience significant discomfort, contact your physician.  For some patients it is necessary to repeat the treatment for best results.  SIDE EFFECTS: When using 5-fluorouracil/calcipotriene cream, you may have mild irritation, such as redness, dryness, swelling, or a mild burning sensation. This usually resolves within 2 weeks. The more actinic keratoses you have, the more redness and inflammation you can expect during treatment. Eye irritation has been reported rarely. If this occurs, please let us know.   If you have any trouble using this cream, please send [place] a MyChart message or call the office. If you have any other questions about this information, please do not hesitate to ask me before you leave the office or contact me on MyChart or by phone.    Due to recent changes in healthcare laws, you may see results of your pathology and/or laboratory studies on MyChart before the doctors have had a chance to review them. We understand that in some cases there may be results that are confusing or concerning to you. Please understand that not all results are received at the same time and often the doctors may need to interpret multiple results in order to provide you with the best plan of care or course of treatment. Therefore, we ask that you please give [place] 2 business days to thoroughly review all your results before contacting the office for clarification. Should we see a critical lab result, you will be contacted sooner.   If You Need Anything After Your Visit  If you have any questions or concerns for your doctor, please call our main line at [phone] and press option 4 to reach your doctor's medical assistant. If no one answers, please leave a voicemail as directed and we will return your call as soon as possible. Messages left after 4 pm will  be answered the following business day.   You may also send [place] a message via MyChart. We typically respond to MyChart messages within 1-2 business days.  For prescription refills, please ask your pharmacy to contact our office. Our fax number is [phone].  If you have an urgent issue when the clinic is closed that cannot wait until the next business day, you can page your doctor at the number below.    Please note that while we do our best to be available for urgent issues outside of office hours, we are not available 24/7.   If you have an urgent issue and are unable to reach [place], you may choose to seek medical care at your doctor's office, retail clinic, urgent care center, or emergency room.  If you have a medical emergency, please immediately call 911 or go to the emergency department.  Pager Numbers  - Dr. Gwen Pounds: [phone]  - Dr. Roseanne Reno: [phone]  - Dr. Katrinka Blazing: [phone]   In the event of inclement weather, please call our main line at [phone] for an update on the status of any delays or closures.  Dermatology Medication Tips: Please keep the  boxes that topical medications come in in order to help keep track of the instructions about where and how to use these. Pharmacies typically print the medication instructions only on the boxes and not directly on the medication tubes.   If your medication is too expensive, please contact our office at [phone] option 4 or send [place] a message through MyChart.   We are unable to tell what your co-pay for medications will be in advance as this is different depending on your insurance coverage. However, we may be able to find a substitute medication at lower cost or fill out paperwork to get insurance to cover a needed medication.   If a prior authorization is required to get your medication covered by your insurance company, please allow [place] 1-2 business days to complete this process.  Drug prices often vary depending on  where the prescription is filled and some pharmacies may offer cheaper prices.  The website www.goodrx.com contains coupons for medications through different pharmacies. The prices here do not account for what the cost may be with help from insurance (it may be cheaper with your insurance), but the website can give you the price if you did not use any insurance.  - You can print the associated coupon and take it with your prescription to the pharmacy.  - You may also stop by our office during regular business hours and pick up a GoodRx coupon card.  - If you need your prescription sent electronically to a different pharmacy, notify our office through [facility] or by phone at [phone] option 4.     Si Usted Necesita Algo Despus de Su Visita  Tambin puede enviarnos un mensaje a travs de Clinical cytogeneticist. Por lo general respondemos a los mensajes de MyChart en el transcurso de 1 a 2 das hbiles.  Para renovar recetas, por favor pida a su farmacia que se ponga en contacto con nuestra oficina. Annie Sable de fax es [place] [phone].  Si tiene un asunto urgente cuando la clnica est cerrada y que no puede esperar hasta el siguiente da hbil, puede llamar/localizar a su doctor(a) al nmero que aparece a continuacin.   Por favor, tenga en cuenta que aunque hacemos todo lo posible para estar disponibles para asuntos urgentes fuera del horario de [place], no estamos disponibles las 24 horas del da, los 7 [address]  [address] de la [place].   Si tiene un problema urgente y no puede comunicarse con nosotros, puede optar por buscar atencin mdica  en el consultorio de su doctor(a), en una clnica privada, en un centro de atencin urgente o en una sala de emergencias.  Si tiene Engineer, drilling, por favor llame inmediatamente al 911 o vaya a la sala de emergencias.  Nmeros de bper  - Dr. Gwen Pounds: [phone]  - Dra. Roseanne Reno: [phone]  - Dr. Katrinka Blazing: [phone]   En caso de inclemencias  del tiempo, por favor llame a Lacy Duverney principal al [phone] para una actualizacin sobre el [place] de cualquier retraso o cierre.  Consejos para la medicacin en dermatologa: Por favor, guarde las cajas en las que vienen los medicamentos de uso tpico para ayudarle a seguir las instrucciones sobre dnde y cmo usarlos. Las farmacias generalmente imprimen las instrucciones del medicamento slo en las cajas y no directamente en los tubos del [place].   Si su medicamento es muy caro, por favor, pngase en contacto con Rolm Gala llamando al [phone] y presione la opcin 4 o envenos un mensaje a travs de Clinical cytogeneticist.  No podemos decirle cul ser su copago por los medicamentos por adelantado ya que esto es diferente dependiendo de la cobertura de su seguro. Sin embargo, es posible que podamos encontrar un medicamento sustituto a Audiological scientist un formulario para que el seguro cubra el medicamento que se considera necesario.   Si se requiere una autorizacin previa para que su compaa de seguros [place] su medicamento, por favor permtanos de 1 a 2 das hbiles para completar [address]  Los precios de los medicamentos varan con frecuencia dependiendo del Environmental consultant de dnde se surte la receta y alguna farmacias pueden ofrecer precios ms baratos.  El sitio web www.goodrx.com tiene cupones para medicamentos de Health and safety inspector. Los precios aqu no tienen en cuenta lo que podra costar con la ayuda del seguro (puede ser ms barato con su seguro), pero el sitio web puede darle el precio si no utiliz Tourist information centre manager.  - Puede imprimir el cupn correspondiente y llevarlo con su receta a la farmacia.  - Tambin puede pasar por nuestra oficina durante el horario de atencin regular y Education officer, museum una tarjeta de cupones de GoodRx.  - Si necesita que su receta se enve electrnicamente a una farmacia diferente, informe a nuestra oficina a travs de MyChart de [place] o por telfono  llamando al [phone] y presione la opcin 4.

## 2023-07-08 ENCOUNTER — Encounter: Payer: Self-pay | Admitting: Gastroenterology

## 2023-07-15 ENCOUNTER — Other Ambulatory Visit: Payer: Self-pay

## 2023-07-15 ENCOUNTER — Ambulatory Visit
Admission: RE | Admit: 2023-07-15 | Discharge: 2023-07-15 | Disposition: A | Discharge status: Home / Self Care | Payer: Medicare Other | Attending: Gastroenterology | Admitting: Gastroenterology

## 2023-07-15 ENCOUNTER — Encounter: Payer: Self-pay | Admitting: Gastroenterology

## 2023-07-15 ENCOUNTER — Encounter
Admission: RE | Disposition: A | Discharge status: Home / Self Care | Payer: Self-pay | Source: Home / Self Care | Attending: Gastroenterology

## 2023-07-15 ENCOUNTER — Ambulatory Visit: Admitting: Certified Registered"

## 2023-07-15 DIAGNOSIS — Z1211 Encounter for screening for malignant neoplasm of colon: Secondary | ICD-10-CM

## 2023-07-15 DIAGNOSIS — K5730 Diverticulosis of large intestine without perforation or abscess without bleeding: Secondary | ICD-10-CM

## 2023-07-15 DIAGNOSIS — K640 First degree hemorrhoids: Secondary | ICD-10-CM

## 2023-07-15 DIAGNOSIS — G709 Myoneural disorder, unspecified: Secondary | ICD-10-CM | POA: Diagnosis not present

## 2023-07-15 DIAGNOSIS — Z8249 Family history of ischemic heart disease and other diseases of the circulatory system: Secondary | ICD-10-CM | POA: Insufficient documentation

## 2023-07-15 DIAGNOSIS — I10 Essential (primary) hypertension: Secondary | ICD-10-CM | POA: Insufficient documentation

## 2023-07-15 HISTORY — PX: COLONOSCOPY WITH PROPOFOL: SHX5780

## 2023-07-15 SURGERY — COLONOSCOPY WITH PROPOFOL
Anesthesia: General

## 2023-07-15 MED ORDER — PROPOFOL 500 MG/50ML IV EMUL
INTRAVENOUS | Status: DC | PRN
  Administered 2023-07-15: 150 ug/kg/min via INTRAVENOUS

## 2023-07-15 MED ORDER — PROPOFOL 1000 MG/100ML IV EMUL
INTRAVENOUS | Status: AC
  Filled 2023-07-15: qty 300

## 2023-07-15 MED ORDER — PROPOFOL 10 MG/ML IV BOLUS
INTRAVENOUS | Status: DC | PRN
  Administered 2023-07-15: 70 mg via INTRAVENOUS

## 2023-07-15 MED ORDER — LIDOCAINE HCL (CARDIAC) PF 100 MG/5ML IV SOSY
PREFILLED_SYRINGE | INTRAVENOUS | Status: DC | PRN
  Administered 2023-07-15: 60 mg via INTRAVENOUS

## 2023-07-15 MED ORDER — SODIUM CHLORIDE 0.9 % IV SOLN: INTRAVENOUS | Status: DC

## 2023-07-15 NOTE — Anesthesia Postprocedure Evaluation (Signed)
 Anesthesia Post Note  Patient: Alyssa Thornton  Procedure(s) Performed: COLONOSCOPY WITH PROPOFOL  Patient location during evaluation: Endoscopy Anesthesia Type: General Level of consciousness: awake and alert Pain management: pain level controlled Vital Signs Assessment: post-procedure vital signs reviewed and stable Respiratory status: spontaneous breathing, nonlabored ventilation, respiratory function stable and patient connected to nasal cannula oxygen Cardiovascular status: blood pressure returned to baseline and stable Postop Assessment: no apparent nausea or vomiting Anesthetic complications: no   No notable events documented.   Last Vitals:  Vitals:   07/15/23 0832 07/15/23 0912  BP: 130/87 128/78  Pulse: 96   Resp: (!) 23 20  Temp:    SpO2: 100% 100%    Last Pain:  Vitals:   07/15/23 0912  TempSrc:   PainSc: 1                  Cleda Mccreedy Rachelanne Whidby

## 2023-07-15 NOTE — H&P (Signed)
 [name], MD [facility] [address] [place], [place] [address] Phone: [phone] Fax : [phone]  Primary Care Physician:  Erasmo Downer, MD Primary Gastroenterologist:  Dr. Servando Snare  Pre-Procedure History & Physical: HPI:  Alyssa Thornton is a 68 y.o. female is here for a screening colonoscopy.   Past Medical History:  Diagnosis Date   Acute diverticulitis 02/23/2022   Atypical hyperplasia of left breast 07/02/2013   1 mm area of atypia associated with a calcified papilloma.   BRCA negative 01/2021   MyRisk neg   Fatty liver    Hypertension    Increased risk of breast cancer 01/2021   IBIS=27.4%   Osteopenia    Postmenopausal atrophic vaginitis     Past Surgical History:  Procedure Laterality Date   bartholins gland surgery     BREAST BIOPSY Left 11/15/2005   LEFT BREAST, CORE BIOPSY:: STROMAL SCLEROSIS WITHIN BENIGN ATROPHIC BREAST TISSUE   BREAST BIOPSY Left July 02, 2013   left breast papilloma with focal atypia   BREAST EXCISIONAL BIOPSY Left 2015   COLONOSCOPY  2007-2008, 2015   Dr Byrnett/Dr Servando Snare   TONSILLECTOMY AND ADENOIDECTOMY  1960    Prior to Admission medications   Medication Sig Start Date End Date Taking? Authorizing Provider  Cholecalciferol (VITAMIN D-3 PO) Take 1,000 Int'l Units by mouth in the morning and at bedtime. 2 tabs daily with a meal   Yes [provider]  fluticasone (FLONASE) 50 MCG/ACT nasal spray Place into both nostrils daily.   Yes [provider]  lisinopril-hydrochlorothiazide (ZESTORETIC) 20-25 MG tablet Take 0.5 tablets by mouth daily. 08/16/22  Yes Bacigalupo, Marzella Schlein, MD  montelukast (SINGULAIR) 10 MG tablet TAKE 1 TABLET(10 MG) BY MOUTH DAILY 02/10/23  Yes Bacigalupo, Marzella Schlein, MD  Omega-3 Fatty Acids (FISH OIL PO) Take by mouth.   Yes [provider]  vitamin C (ASCORBIC ACID) 500 MG tablet Take 500 mg by mouth daily.   Yes [provider]  Efinaconazole (JUBLIA) 10 % SOLN  Apply to the toenails QHS. 07/01/23   Willeen Niece, MD  fluorouracil (EFUDEX) 5 % cream Apply to the nose, ears, and R forearm BID x 7 days. 07/01/23   Willeen Niece, MD    Allergies as of 05/19/2023 - Review Complete 03/21/2023  Allergen Reaction Noted   Levofloxacin Nausea And Vomiting 05/21/2016   Diclofenac sodium Diarrhea 06/16/2013   Tramadol Diarrhea 06/16/2013    Family History  Problem Relation Age of Onset   Heart disease Father    Hypertension Father    Cancer Sister        bone marrow cancer   Diabetes Mother        type 2   Cirrhosis Mother    Hypercholesterolemia Brother    Hyperlipidemia Brother    Cancer - Ovarian Maternal Grandmother        50   Stomach cancer Other 47   Breast cancer Neg Hx     Social History   Socioeconomic History   Marital status: Married    Spouse name: Not on file   Number of children: Not on file   Years of education: Not on file   Highest education level: Bachelor's degree (e.g., BA, AB, BS)  Occupational History   Not on file  Tobacco Use   Smoking status: Never   Smokeless tobacco: Never  Vaping Use   Vaping status: Never Used  Substance and Sexual Activity   Alcohol use: No  Drug use: No   Sexual activity: Not Currently    Birth control/protection: Post-menopausal  Other Topics Concern   Not on file  Social History Narrative   ** Merged History Encounter **       Social Drivers of Health   Financial Resource Strain: Low Risk  (01/22/2023)   Overall Financial Resource Strain (CARDIA)    Difficulty of Paying Living Expenses: Not hard at all  Food Insecurity: No Food Insecurity (01/22/2023)   Hunger Vital Sign    Worried About Running Out of Food in the Last Year: Never true    Ran Out of Food in the Last Year: Never true  Transportation Needs: No Transportation Needs (01/22/2023)   PRAPARE - Administrator, Civil Service (Medical): No    Lack of Transportation (Non-Medical): No  Physical Activity:  Sufficiently Active (01/22/2023)   Exercise Vital Sign    Days of Exercise per Week: 4 days    Minutes of Exercise per Session: 60 min  Stress: No Stress Concern Present (01/22/2023)   [employer] of Occupational Health - Occupational Stress Questionnaire    Feeling of Stress : Not at all  Social Connections: Moderately Isolated (01/22/2023)   Social Connection and Isolation Panel [NHANES]    Frequency of Communication with Friends and Family: Once a week    Frequency of Social Gatherings with Friends and Family: Once a week    Attends Religious Services: Never    Database administrator or Organizations: Yes    Attends Banker Meetings: Never    Marital Status: Married  Catering manager Violence: Not on file    Review of Systems: See HPI, otherwise negative ROS  Physical Exam: BP 132/87   Pulse 86   Temp (!) 97.1 F (36.2 C) (Temporal)   Resp 14   Ht 5\' 3"  (1.6 m)   Wt 66.7 kg   SpO2 100%   BMI 26.04 kg/m  General:   Alert,  pleasant and cooperative in NAD Head:  Normocephalic and atraumatic. Neck:  Supple; no masses or thyromegaly. Lungs:  Clear throughout to auscultation.    Heart:  Regular rate and rhythm. Abdomen:  Soft, nontender and nondistended. Normal bowel sounds, without guarding, and without rebound.   Neurologic:  Alert and  oriented x4;  grossly normal neurologically.  Impression/Plan: Alyssa Thornton is now here to undergo a screening colonoscopy.  Risks, benefits, and alternatives regarding colonoscopy have been reviewed with the patient.  Questions have been answered.  All parties agreeable.

## 2023-07-15 NOTE — Anesthesia Preprocedure Evaluation (Signed)
 Anesthesia Evaluation  Patient identified by MRN, date of birth, ID band Patient awake    Reviewed: Allergy & Precautions, NPO status , Patient's Chart, lab work & pertinent test results  History of Anesthesia Complications (+) PONV and history of anesthetic complications  Airway Mallampati: III  TM Distance: <3 FB Neck ROM: full    Dental  (+) Chipped, Poor Dentition   Pulmonary neg pulmonary ROS, neg shortness of breath   Pulmonary exam normal        Cardiovascular Exercise Tolerance: Good hypertension, (-) angina Normal cardiovascular exam     Neuro/Psych  Headaches  Neuromuscular disease  negative psych ROS   GI/Hepatic negative GI ROS, Neg liver ROS,neg GERD  ,,  Endo/Other  negative endocrine ROS    Renal/GU negative Renal ROS  negative genitourinary   Musculoskeletal   Abdominal   Peds  Hematology negative hematology ROS (+)   Anesthesia Other Findings Past Medical History: 02/23/2022: Acute diverticulitis 07/02/2013: Atypical hyperplasia of left breast     Comment:  1 mm area of atypia associated with a calcified               papilloma. 01/2021: BRCA negative     Comment:  MyRisk neg No date: Fatty liver No date: Hypertension 01/2021: Increased risk of breast cancer     Comment:  IBIS=27.4% No date: Osteopenia No date: Postmenopausal atrophic vaginitis  Past Surgical History: No date: bartholins gland surgery 11/15/2005: BREAST BIOPSY; Left     Comment:  LEFT BREAST, CORE BIOPSY:: STROMAL SCLEROSIS WITHIN               BENIGN ATROPHIC BREAST TISSUE July 02, 2013: BREAST BIOPSY; Left     Comment:  left breast papilloma with focal atypia 2015: BREAST EXCISIONAL BIOPSY; Left 2007-2008, 2015: COLONOSCOPY     Comment:  Dr Byrnett/Dr Servando Snare 1962: TONSILLECTOMY AND ADENOIDECTOMY     Reproductive/Obstetrics negative OB ROS                             Anesthesia  Physical Anesthesia Plan  ASA: 2  Anesthesia Plan: General   Post-op Pain Management:    Induction: Intravenous  PONV Risk Score and Plan: Propofol infusion and TIVA  Airway Management Planned: Natural Airway and Nasal Cannula  Additional Equipment:   Intra-op Plan:   Post-operative Plan:   Informed Consent: I have reviewed the patients History and Physical, chart, labs and discussed the procedure including the risks, benefits and alternatives for the proposed anesthesia with the patient or authorized representative who has indicated his/her understanding and acceptance.     Dental Advisory Given  Plan Discussed with: Anesthesiologist, CRNA and Surgeon  Anesthesia Plan Comments: (Patient consented for risks of anesthesia including but not limited to:  - adverse reactions to medications - risk of airway placement if required - damage to eyes, teeth, lips or other oral mucosa - nerve damage due to positioning  - sore throat or hoarseness - Damage to heart, brain, nerves, lungs, other parts of body or loss of life  Patient voiced understanding and assent.)       Anesthesia Quick Evaluation

## 2023-07-15 NOTE — Transfer of Care (Signed)
 Immediate Anesthesia Transfer of Care Note  Patient: Alyssa Thornton  Procedure(s) Performed: COLONOSCOPY WITH PROPOFOL  Patient Location: PACU  Anesthesia Type:General  Level of Consciousness: drowsy and patient cooperative  Airway & Oxygen Therapy: Patient Spontanous Breathing and Patient connected to nasal cannula oxygen  Post-op Assessment: Report given to RN and Post -op Vital signs reviewed and stable  Post vital signs: Reviewed and stable  Last Vitals:  Vitals Value Taken Time  BP    Temp    Pulse    Resp    SpO2      Last Pain:  Vitals:   07/15/23 0747  TempSrc: Temporal  PainSc: 0-No pain         Complications: No notable events documented.

## 2023-07-15 NOTE — Op Note (Addendum)
 [facility] Gastroenterology Patient Name: Alyssa Thornton Procedure Date: 07/15/2023 7:18 AM MRN: [id number] Account #: [id number] Date of Birth: July 29, 1955 Admit Type: Outpatient Age: 68 Room: [facility] ENDO ROOM 4 Gender: Female Note Status: Finalized Instrument Name: Prentice Docker [id number] Procedure:             Colonoscopy Indications:           Screening for colorectal malignant neoplasm Providers:             [name] MD, MD Referring MD:          Marzella Schlein. Bacigalupo (Referring MD) Medicines:             Propofol per Anesthesia Complications:         No immediate complications. Procedure:             Pre-Anesthesia Assessment:                        - Prior to the procedure, a History and Physical was                         performed, and patient medications and allergies were                         reviewed. The patient's tolerance of previous                         anesthesia was also reviewed. The risks and benefits                         of the procedure and the sedation options and risks                         were discussed with the patient. All questions were                         answered, and informed consent was obtained. Prior                         Anticoagulants: The patient has taken no anticoagulant                         or antiplatelet agents. ASA Grade Assessment: II - A                         patient with mild systemic disease. After reviewing                         the risks and benefits, the patient was deemed in                         satisfactory condition to undergo the procedure.                        After obtaining informed consent, the colonoscope was                         passed under direct vision. Throughout the procedure,  the patient's blood pressure, pulse, and oxygen                         saturations were monitored continuously. The                         Colonoscope was introduced through  the anus and                         advanced to the the cecum, identified by appendiceal                         orifice and ileocecal valve. The colonoscopy was                         performed without difficulty. The patient tolerated                         the procedure well. The quality of the bowel                         preparation was excellent. Findings:      The perianal and digital rectal examinations were normal.      Multiple small-mouthed diverticula were found in the sigmoid colon and       descending colon.      Non-bleeding internal hemorrhoids were found during retroflexion. The       hemorrhoids were Grade I (internal hemorrhoids that do not prolapse). Impression:            - Diverticulosis in the sigmoid colon and in the                         descending colon.                        - Non-bleeding internal hemorrhoids.                        - No specimens collected. Recommendation:        - Discharge patient to home.                        - Resume previous diet.                        - Continue present medications.                        - Repeat colonoscopy in 10 years for screening                         purposes. Procedure Code(s):     --- Professional ---                        [phone], Colonoscopy, flexible; diagnostic, including                         collection of specimen(s) by brushing or washing, when  performed (separate procedure) Diagnosis Code(s):     --- Professional ---                        Z12.11, Encounter for screening for malignant neoplasm                         of colon CPT copyright 2022 American Medical Association. All rights reserved. The codes documented in this report are preliminary and upon coder review may  be revised to meet current compliance requirements. [name] MD, MD 07/15/2023 8:21:04 AM This report has been signed electronically. Number of Addenda: 0 Note Initiated On: 07/15/2023 7:18  AM Scope Withdrawal Time: 0 hours 7 minutes 25 seconds  Total Procedure Duration: 0 hours 8 minutes 56 seconds  Estimated Blood Loss:  Estimated blood loss: none.      [facility]

## 2023-07-16 ENCOUNTER — Encounter: Payer: Self-pay | Admitting: Gastroenterology

## 2023-07-31 ENCOUNTER — Encounter: Payer: Self-pay | Admitting: Family Medicine

## 2023-07-31 ENCOUNTER — Ambulatory Visit (INDEPENDENT_AMBULATORY_CARE_PROVIDER_SITE_OTHER): Payer: Self-pay | Admitting: Family Medicine

## 2023-07-31 VITALS — BP 126/83 | HR 73 | Ht 61.0 in | Wt 151.4 lb

## 2023-07-31 DIAGNOSIS — R7303 Prediabetes: Secondary | ICD-10-CM | POA: Diagnosis not present

## 2023-07-31 DIAGNOSIS — E782 Mixed hyperlipidemia: Secondary | ICD-10-CM | POA: Diagnosis not present

## 2023-07-31 DIAGNOSIS — Z1231 Encounter for screening mammogram for malignant neoplasm of breast: Secondary | ICD-10-CM | POA: Diagnosis not present

## 2023-07-31 DIAGNOSIS — I10 Essential (primary) hypertension: Secondary | ICD-10-CM

## 2023-07-31 NOTE — Assessment & Plan Note (Signed)
 Hyperlipidemia is not currently being managed with medication. Due for routine cholesterol lab work to assess current status. - Order cholesterol lab work.

## 2023-07-31 NOTE — Assessment & Plan Note (Signed)
 Hypertension is well-controlled with current medication regimen. Blood pressure readings are favorable.  - Continue current antihypertensive regimen with lisinopril  and HCTZ 20-25 mg, taking half a tablet daily.

## 2023-07-31 NOTE — Progress Notes (Signed)
 Established patient visit   Patient: Alyssa Thornton   DOB: 1956/02/17   68 y.o. Female  MRN: [id number] Visit Date: 07/31/2023  Today's healthcare provider: Aden Agreste, MD   Chief Complaint  Patient presents with   Medical Management of Chronic Issues    6 month hypertension follow-up. Abnormal levels for lipid and diabetes last visit but no current treatment.   Hypertension    Pt reports she monitors at home with reading ranging from 120-131/80-81. She reports no symptoms and taking medication as prescribed   Subjective    Hypertension   HPI     Medical Management of Chronic Issues    Additional comments: 6 month hypertension follow-up. Abnormal levels for lipid and diabetes last visit but no current treatment.        Hypertension    Additional comments: Pt reports she monitors at home with reading ranging from 120-131/80-81. She reports no symptoms and taking medication as prescribed      Last edited by Pasty Bongo, CMA on 07/31/2023  9:28 AM.       Discussed the use of AI scribe software for clinical note transcription with the patient, who gave verbal consent to proceed.  History of Present Illness   A 68 year old patient with a history of hypertension, prediabetes, thrombocytopenia, hyperlipidemia, and fatty liver disease presents for a routine follow-up visit. The patient is currently on lisinopril  and HCTZ for hypertension and is not on any cholesterol or blood sugar management medications. The patient recently had a colonoscopy, which showed one or two diverticula but no polyps. The patient also mentions that she had a genetic test for three types of cancer in 2022, which came back negative.         Medications: Outpatient Medications Prior to Visit  Medication Sig   Cholecalciferol (VITAMIN D-3 PO) Take 1,000 Int'l Units by mouth in the morning and at bedtime. 2 tabs daily with a meal   Efinaconazole  (JUBLIA ) 10 % SOLN Apply to the toenails  QHS.   fluorouracil  (EFUDEX ) 5 % cream Apply to the nose, ears, and R forearm BID x 7 days.   fluticasone  (FLONASE ) 50 MCG/ACT nasal spray Place into both nostrils daily.   lisinopril -hydrochlorothiazide  (ZESTORETIC ) 20-25 MG tablet Take 0.5 tablets by mouth daily.   montelukast  (SINGULAIR ) 10 MG tablet TAKE 1 TABLET(10 MG) BY MOUTH DAILY   Omega-3 Fatty Acids (FISH OIL PO) Take by mouth.   vitamin C (ASCORBIC ACID) 500 MG tablet Take 500 mg by mouth daily.   No facility-administered medications prior to visit.    Review of Systems      Objective    BP 126/83 (BP Location: Left Arm, Patient Position: Sitting, Cuff Size: Normal)   Pulse 73   Ht 5\' 1"  (1.549 m)   Wt 151 lb 6.4 oz (68.7 kg)   SpO2 99%   BMI 28.61 kg/m    Physical Exam Vitals reviewed.  Constitutional:      General: She is not in acute distress.    Appearance: Normal appearance. She is well-developed. She is not diaphoretic.  HENT:     Head: Normocephalic and atraumatic.  Eyes:     General: No scleral icterus.    Conjunctiva/sclera: Conjunctivae normal.  Neck:     Thyroid: No thyromegaly.  Cardiovascular:     Rate and Rhythm: Normal rate and regular rhythm.     Heart sounds: Normal heart sounds. No murmur heard. Pulmonary:     Effort: Pulmonary  effort is normal. No respiratory distress.     Breath sounds: Normal breath sounds. No wheezing, rhonchi or rales.  Musculoskeletal:     Cervical back: Neck supple.     Right lower leg: No edema.     Left lower leg: No edema.  Lymphadenopathy:     Cervical: No cervical adenopathy.  Skin:    General: Skin is warm and dry.     Findings: No rash.  Neurological:     Mental Status: She is alert and oriented to person, place, and time. Mental status is at baseline.  Psychiatric:        Mood and Affect: Mood normal.        Behavior: Behavior normal.      No results found for any visits on 07/31/23.  Assessment & Plan     Problem List Items Addressed This  Visit       Cardiovascular and Mediastinum   Essential hypertension - Primary   Hypertension is well-controlled with current medication regimen. Blood pressure readings are favorable.  - Continue current antihypertensive regimen with lisinopril  and HCTZ 20-25 mg, taking half a tablet daily.      Relevant Orders   Comprehensive metabolic panel with GFR     Other   Hyperlipidemia   Hyperlipidemia is not currently being managed with medication. Due for routine cholesterol lab work to assess current status. - Order cholesterol lab work.      Relevant Orders   Comprehensive metabolic panel with GFR   Lipid panel   Prediabetes   Prediabetes is not currently being managed with medication. Due for routine A1c lab work to assess current status. - Order A1c lab work.      Relevant Orders   Hemoglobin A1c   Other Visit Diagnoses       Breast cancer screening by mammogram       Relevant Orders   MM 3D SCREENING MAMMOGRAM BILATERAL BREAST           General Health Maintenance Routine health maintenance is ongoing. Recent colonoscopy showed no polyps, only a few diverticula. She has aged out of routine colonoscopy screening due to low risk and potential procedural risks outweighing benefits. Mammogram is due. - Order mammogram and send to [facility].        Return in about 6 months (around 01/30/2024) for chronic disease f/u.       Aden Agreste, MD  [facility] [phone] (phone) [phone] (fax)  [facility]

## 2023-07-31 NOTE — Assessment & Plan Note (Signed)
 Prediabetes is not currently being managed with medication. Due for routine A1c lab work to assess current status. - Order A1c lab work.

## 2023-08-01 ENCOUNTER — Encounter: Payer: Self-pay | Admitting: Family Medicine

## 2023-08-01 LAB — LIPID PANEL
Chol/HDL Ratio: 2.9 ratio (ref 0.0–4.4)
Cholesterol, Total: 166 mg/dL (ref 100–199)
HDL: 57 mg/dL (ref >39)
LDL Chol Calc (NIH): 91 mg/dL (ref 0–99)
Triglycerides: 99 mg/dL (ref 0–149)
VLDL Cholesterol Cal: 18 mg/dL (ref 5–40)

## 2023-08-01 LAB — HEMOGLOBIN A1C
Est. average glucose Bld gHb Est-mCnc: 114 mg/dL
Hgb A1c MFr Bld: 5.6 % (ref 4.8–5.6)

## 2023-08-01 LAB — COMPREHENSIVE METABOLIC PANEL WITH GFR
ALT: 31 IU/L (ref 0–32)
AST: 31 IU/L (ref 0–40)
Albumin: 4.2 g/dL (ref 3.9–4.9)
Alkaline Phosphatase: 113 IU/L (ref 44–121)
BUN/Creatinine Ratio: 13 (ref 12–28)
BUN: 9 mg/dL (ref 8–27)
Bilirubin Total: 1 mg/dL (ref 0.0–1.2)
CO2: 24 mmol/L (ref 20–29)
Calcium: 9 mg/dL (ref 8.7–10.3)
Chloride: 105 mmol/L (ref 96–106)
Creatinine, Ser: 0.69 mg/dL (ref 0.57–1.00)
Globulin, Total: 1.9 g/dL (ref 1.5–4.5)
Glucose: 109 mg/dL — ABNORMAL HIGH (ref 70–99)
Potassium: 4.2 mmol/L (ref 3.5–5.2)
Sodium: 142 mmol/L (ref 134–144)
Total Protein: 6.1 g/dL (ref 6.0–8.5)
eGFR: 94 mL/min/{1.73_m2} (ref >59)

## 2023-08-07 ENCOUNTER — Encounter: Payer: Self-pay | Admitting: Family Medicine

## 2023-08-07 LAB — HM MAMMOGRAPHY

## 2023-08-16 ENCOUNTER — Other Ambulatory Visit: Payer: Self-pay | Admitting: Family Medicine

## 2023-08-19 NOTE — Telephone Encounter (Signed)
 Requested Prescriptions  Pending Prescriptions Disp Refills   lisinopril -hydrochlorothiazide  (ZESTORETIC ) 20-25 MG tablet [Pharmacy Med Name: LISINOPRIL -HCTZ 20/25MG  TABLETS] 45 tablet 1    Sig: TAKE 1/2 TABLET BY MOUTH DAILY     Cardiovascular:  ACEI + Diuretic Combos Passed - 08/19/2023  8:32 AM      Passed - Na in normal range and within 180 days    Sodium  Date Value Ref Range Status  07/31/2023 142 134 - 144 mmol/L Final         Passed - K in normal range and within 180 days    Potassium  Date Value Ref Range Status  07/31/2023 4.2 3.5 - 5.2 mmol/L Final         Passed - Cr in normal range and within 180 days    Creat  Date Value Ref Range Status  12/17/2016 0.59 0.50 - 0.99 mg/dL Final    Comment:    For patients >51 years of age, the reference limit for Creatinine is approximately 13% higher for people identified as African-American. .    Creatinine, Ser  Date Value Ref Range Status  07/31/2023 0.69 0.57 - 1.00 mg/dL Final         Passed - eGFR is 30 or above and within 180 days    GFR, Est African American  Date Value Ref Range Status  12/17/2016 113 > OR = 60 mL/min/1.35m2 Final   GFR calc Af Amer  Date Value Ref Range Status  05/22/2020 106 >59 mL/min/1.73 Final    Comment:    **In accordance with recommendations from the NKF-ASN Task force,**   [facility] is in the process of updating its eGFR calculation to the   2021 CKD-EPI creatinine equation that estimates kidney function   without a race variable.    GFR, Est Non African American  Date Value Ref Range Status  12/17/2016 98 > OR = 60 mL/min/1.59m2 Final   GFR, Estimated  Date Value Ref Range Status  02/24/2022 >60 >60 mL/min Final    Comment:    (NOTE) Calculated using the CKD-EPI Creatinine Equation (2021)    eGFR  Date Value Ref Range Status  07/31/2023 94 >59 mL/min/1.73 Final         Passed - Patient is not pregnant      Passed - Last BP in normal range    BP Readings from Last 1  Encounters:  07/31/23 126/83         Passed - Valid encounter within last 6 months    Recent Outpatient Visits           2 weeks ago Essential hypertension   [facility] [place], Stan Eans, MD       Future Appointments             In 4 months Artemio Larry, MD [facility]

## 2023-09-02 ENCOUNTER — Telehealth: Payer: Self-pay

## 2023-09-02 ENCOUNTER — Ambulatory Visit: Payer: Self-pay

## 2023-09-02 NOTE — Telephone Encounter (Signed)
 Copied from CRM [id number]. Topic: Clinical - Medical Advice >> Sep 02, 2023 12:43 PM Danna Duster wrote: Reason for CRM: Patient began feeling sick 08/26/23, tested positive for COVID-19 with home test 08/29/23 and 09/01/23. Still having chest congestion, sinuses.  Patient asking for provider to prescribe a "Z-Pack" or something that will help the continued chest congestion and sinuses.  The next available appointment is 10/07/23  05/27 1350- This RN made 1st attempt to call patient, no answer, received message stating "call could not be completed as dialed". (Spam blocker?) Will go ahead and route to office for follow-up due to nature of chief complaint

## 2023-09-02 NOTE — Telephone Encounter (Signed)
 Copied from CRM [id number]. Topic: Appointments - Scheduling Inquiry for Clinic >> Sep 02, 2023  2:50 PM Ivette P wrote: Reason for CRM: pt called in to schedule appt per triage messages and Kortlynn Poust.   Next availabe appt is 07/01 and too far out. Pt needs something sooner.   Previous CRM: "Patient began feeling sick 08/26/23, tested positive for COVID-19 with home test 08/29/23 and 09/01/23. Still having chest congestion, sinuses. Patient asking for provider to prescribe a "Z-Pack" or something that will help the continued chest congestion and sinuses. The next available appointment is 10/07/23"

## 2023-09-05 ENCOUNTER — Ambulatory Visit: Admitting: Internal Medicine

## 2023-12-02 ENCOUNTER — Other Ambulatory Visit: Payer: Self-pay | Admitting: Orthopaedic Surgery

## 2023-12-02 DIAGNOSIS — M25511 Pain in right shoulder: Secondary | ICD-10-CM

## 2023-12-05 ENCOUNTER — Ambulatory Visit
Admission: RE | Admit: 2023-12-05 | Discharge: 2023-12-05 | Disposition: A | Discharge status: Home / Self Care | Source: Ambulatory Visit | Attending: Orthopaedic Surgery | Admitting: Orthopaedic Surgery

## 2023-12-05 DIAGNOSIS — M25511 Pain in right shoulder: Secondary | ICD-10-CM

## 2023-12-30 ENCOUNTER — Ambulatory Visit: Admitting: Dermatology

## 2024-01-05 ENCOUNTER — Ambulatory Visit: Admitting: Dermatology

## 2024-01-05 ENCOUNTER — Ambulatory Visit: Admission: EM | Admit: 2024-01-05 | Discharge: 2024-01-05 | Disposition: A | Discharge status: Home / Self Care

## 2024-01-05 DIAGNOSIS — L814 Other melanin hyperpigmentation: Secondary | ICD-10-CM | POA: Diagnosis not present

## 2024-01-05 DIAGNOSIS — Z7189 Other specified counseling: Secondary | ICD-10-CM

## 2024-01-05 DIAGNOSIS — Z872 Personal history of diseases of the skin and subcutaneous tissue: Secondary | ICD-10-CM | POA: Diagnosis not present

## 2024-01-05 DIAGNOSIS — H9201 Otalgia, right ear: Secondary | ICD-10-CM

## 2024-01-05 DIAGNOSIS — L578 Other skin changes due to chronic exposure to nonionizing radiation: Secondary | ICD-10-CM

## 2024-01-05 DIAGNOSIS — L570 Actinic keratosis: Secondary | ICD-10-CM

## 2024-01-05 DIAGNOSIS — L821 Other seborrheic keratosis: Secondary | ICD-10-CM | POA: Diagnosis not present

## 2024-01-05 DIAGNOSIS — W908XXA Exposure to other nonionizing radiation, initial encounter: Secondary | ICD-10-CM

## 2024-01-05 NOTE — Discharge Instructions (Addendum)
 Your ear and ear canal are normal today.  Follow-up with your primary care provider if your symptoms are not improving.

## 2024-01-05 NOTE — Patient Instructions (Addendum)

## 2024-01-05 NOTE — ED Provider Notes (Signed)
 Alyssa Thornton    CSN: [id number] Arrival date & time: 01/05/24  1139      History   Chief Complaint Chief Complaint  Patient presents with   Otalgia    HPI Alyssa Thornton is a 68 y.o. female.  Patient presents with 2-week history of right ear pain which she describes as feeling muffled with fan-like noise blowing.  No fever, ear drainage, sore throat, cough, shortness of breath.  She states she has chronic sinusitis and takes daily Flonase  nasal spray and Singulair .  The history is provided by the patient and medical records.    Past Medical History:  Diagnosis Date   Acute diverticulitis 02/23/2022   Atypical hyperplasia of left breast 07/02/2013   1 mm area of atypia associated with a calcified papilloma.   BRCA negative 01/2021   MyRisk neg   Fatty liver    Hypertension    Increased risk of breast cancer 01/2021   IBIS=27.4%   Osteopenia    Postmenopausal atrophic vaginitis     Patient Active Problem List   Diagnosis Date Noted   Constipation 03/14/2022   Benign paroxysmal positional vertigo 01/15/2022   Dyspareunia in female 12/26/2020   Postmenopausal atrophic vaginitis 12/26/2020   Thrombocytopenia 05/26/2019   Family history of ovarian cancer 06/16/2018   Prediabetes 05/05/2018   Overweight 05/05/2018   Abnormal mammogram of left breast 02/17/2017   Bilateral carpal tunnel syndrome 12/17/2016   Allergic rhinitis 03/31/2015   Hyperlipidemia 03/31/2015   Fatty infiltration of liver 03/31/2015   Essential hypertension 03/31/2015   Headache, migraine 03/31/2015   Plantar fasciitis 03/31/2015   Atypical hyperplasia of left breast 07/10/2013    Past Surgical History:  Procedure Laterality Date   bartholins gland surgery     BREAST BIOPSY Left 11/15/2005   LEFT BREAST, CORE BIOPSY:: STROMAL SCLEROSIS WITHIN BENIGN ATROPHIC BREAST TISSUE   BREAST BIOPSY Left July 02, 2013   left breast papilloma with focal atypia   BREAST EXCISIONAL BIOPSY Left  2015   COLONOSCOPY  2007-2008, 2015   Dr Byrnett/Dr Jinny   COLONOSCOPY WITH PROPOFOL  N/A 07/15/2023   Procedure: COLONOSCOPY WITH PROPOFOL ;  Surgeon: Jinny Carmine, MD;  [facility]COPY;  Service: Endoscopy;  Laterality: N/A;   TONSILLECTOMY AND ADENOIDECTOMY  1960    OB History   No obstetric history on file.    Obstetric Comments  Menses: Age 46.          Home Medications    Prior to Admission medications   Medication Sig Start Date End Date Taking? Authorizing Provider  Cholecalciferol (VITAMIN D-3 PO) Take 1,000 Int'l Units by mouth in the morning and at bedtime. 2 tabs daily with a meal    [provider]  Efinaconazole  (JUBLIA ) 10 % SOLN Apply to the toenails QHS. 07/01/23   Jackquline Sawyer, MD  fluorouracil  (EFUDEX ) 5 % cream Apply to the nose, ears, and R forearm BID x 7 days. 07/01/23   Jackquline Sawyer, MD  fluticasone  (FLONASE ) 50 MCG/ACT nasal spray Place into both nostrils daily.    [provider]  lisinopril -hydrochlorothiazide  (ZESTORETIC ) 20-25 MG tablet TAKE 1/2 TABLET BY MOUTH DAILY 08/19/23   Bacigalupo, Angela M, MD  montelukast  (SINGULAIR ) 10 MG tablet TAKE 1 TABLET(10 MG) BY MOUTH DAILY 02/10/23   Bacigalupo, Jon CHRISTELLA, MD  Omega-3 Fatty Acids (FISH OIL PO) Take by mouth.    [provider]  vitamin C (ASCORBIC ACID) 500 MG tablet Take 500 mg by mouth daily.  [provider]    Family History Family History  Problem Relation Age of Onset   Heart disease Father    Hypertension Father    Cancer Sister        bone marrow cancer   Diabetes Mother        type 2   Cirrhosis Mother    Hypercholesterolemia Brother    Hyperlipidemia Brother    Cancer - Ovarian Maternal Grandmother        12   Stomach cancer Other 61   Breast cancer Neg Hx     Social History Social History   Tobacco Use   Smoking status: Never   Smokeless tobacco: Never  Vaping Use   Vaping status: Never Used  Substance Use Topics   Alcohol  use: No   Drug use: No     Allergies   Levofloxacin, Diclofenac sodium, and Tramadol   Review of Systems Review of Systems  Constitutional:  Negative for chills and fever.  HENT:  Positive for ear pain. Negative for ear discharge and sore throat.   Respiratory:  Negative for cough and shortness of breath.      Physical Exam Triage Vital Signs ED Triage Vitals  Encounter Vitals Group     BP 01/05/24 1209 126/77     Girls Systolic BP Percentile --      Girls Diastolic BP Percentile --      Boys Systolic BP Percentile --      Boys Diastolic BP Percentile --      Pulse Rate 01/05/24 1209 93     Resp 01/05/24 1209 18     Temp 01/05/24 1209 98.2 F (36.8 C)     Temp src --      SpO2 01/05/24 1209 97 %     Weight --      Height --      Head Circumference --      Peak Flow --      Pain Score 01/05/24 1213 0     Pain Loc --      Pain Education --      Exclude from Growth Chart --    No data found.  Updated Vital Signs BP 126/77   Pulse 93   Temp 98.2 F (36.8 C)   Resp 18   SpO2 97%   Visual Acuity Right Eye Distance:   Left Eye Distance:   Bilateral Distance:    Right Eye Near:   Left Eye Near:    Bilateral Near:     Physical Exam Constitutional:      General: She is not in acute distress. HENT:     Right Ear: Tympanic membrane and ear canal normal.     Left Ear: Tympanic membrane and ear canal normal.     Nose: Nose normal.     Mouth/Throat:     Mouth: Mucous membranes are moist.     Pharynx: Oropharynx is clear.  Cardiovascular:     Rate and Rhythm: Normal rate and regular rhythm.     Heart sounds: Normal heart sounds.  Pulmonary:     Effort: Pulmonary effort is normal. No respiratory distress.     Breath sounds: Normal breath sounds.  Neurological:     Mental Status: She is alert.      UC Treatments / Results  Labs (all labs ordered are listed, but only abnormal results are displayed) Labs Reviewed - No data to  display  EKG   Radiology No results found.  Procedures Procedures (including critical care time)  Medications Ordered in UC Medications - No data to display  Initial Impression / Assessment and Plan / UC Course  I have reviewed the triage vital signs and the nursing notes.  Pertinent labs & imaging results that were available during my care of the patient were reviewed by me and considered in my medical decision making (see chart for details).    Right otalgia.  Afebrile and vital signs are stable.  No indication of ear infection at this time.  Discussed symptomatic treatment including Tylenol or ibuprofen as needed, plain Mucinex as needed, warm compresses.  Instructed her to follow-up with her PCP if she is not improving.  Education provided on earache.  She agrees to plan of care.  Final Clinical Impressions(s) / UC Diagnoses   Final diagnoses:  Otalgia of right ear     Discharge Instructions      Your ear and ear canal are normal today.  Follow-up with your primary care provider if your symptoms are not improving.      ED Prescriptions   None    PDMP not reviewed this encounter.   Corlis Burnard DEL, NP 01/05/24 1259

## 2024-01-05 NOTE — Progress Notes (Signed)
 Follow-Up Visit   Subjective  Alyssa Thornton is a 68 y.o. female who presents for the following: 6 month follow-up Actinic keratosis.  The patient has spots, moles and lesions to be evaluated, some may be new or changing. She used 5FU/Calcipotriene cream to the nose, ears, and right forearm twice daily x 7 days after last visit. She had a good reaction and spots are gone.     The following portions of the chart were reviewed this encounter and updated as appropriate: medications, allergies, medical history  Review of Systems:  No other skin or systemic complaints except as noted in HPI or Assessment and Plan.  Objective  Well appearing patient in no apparent distress; mood and affect are within normal limits.  A focused examination was performed of the following areas: Face, arms  Relevant exam findings are noted in the Assessment and Plan.  R forearm x 1 Keratotic macule  Assessment & Plan  HYPERTROPHIC ACTINIC KERATOSIS R forearm x 1 Actinic keratoses are precancerous spots that appear secondary to cumulative UV radiation exposure/sun exposure over time. They are chronic with expected duration over 1 year. A portion of actinic keratoses will progress to squamous cell carcinoma of the skin. It is not possible to reliably predict which spots will progress to skin cancer and so treatment is recommended to prevent development of skin cancer.  Recommend daily broad spectrum sunscreen SPF 30+ to sun-exposed areas, reapply every 2 hours as needed.  Recommend staying in the shade or wearing long sleeves, sun glasses (UVA+UVB protection) and wide brim hats (4-inch brim around the entire circumference of the hat). Call for new or changing lesions. Destruction of lesion - R forearm x 1  Destruction method: cryotherapy   Informed consent: discussed and consent obtained   Lesion destroyed using liquid nitrogen: Yes   Region frozen until ice ball extended beyond lesion: Yes   Outcome:  patient tolerated procedure well with no complications   Post-procedure details: wound care instructions given   Additional details:  Prior to procedure, discussed risks of blister formation, small wound, skin dyspigmentation, or rare scar following cryotherapy. Recommend Vaseline ointment to treated areas while healing.    ACTINIC DAMAGE WITH PRECANCEROUS ACTINIC KERATOSES Counseling for Topical Chemotherapy Management: Patient exhibits: - Severe, confluent actinic changes with pre-cancerous actinic keratoses that is secondary to cumulative UV radiation exposure over time - Condition that is severe; chronic, not at goal. - diffuse scaly erythematous macules and papules with underlying dyspigmentation - Discussed Prescription Field Treatment topical Chemotherapy for Severe, Chronic Confluent Actinic Changes with Pre-Cancerous Actinic Keratoses Field treatment involves treatment of an entire area of skin that has confluent Actinic Changes (Sun/ Ultraviolet light damage) and PreCancerous Actinic Keratoses by method of PhotoDynamic Therapy (PDT) and/or prescription Topical Chemotherapy agents such as 5-fluorouracil , 5-fluorouracil /calcipotriene, and/or imiquimod.  The purpose is to decrease the number of clinically evident and subclinical PreCancerous lesions to prevent progression to development of skin cancer by chemically destroying early precancer changes that may or may not be visible.  It has been shown to reduce the risk of developing skin cancer in the treated area. As a result of treatment, redness, scaling, crusting, and open sores may occur during treatment course. One or more than one of these methods may be used and may have to be used several times to control, suppress and eliminate the PreCancerous changes. Discussed treatment course, expected reaction, and possible side effects. - Recommend daily broad spectrum sunscreen SPF 30+ to sun-exposed areas,  reapply every 2 hours as needed.  -  Staying in the shade or wearing long sleeves, sun glasses (UVA+UVB protection) and wide brim hats (4-inch brim around the entire circumference of the hat) are also recommended. - Call for new or changing lesions. - Good reaction with 5FU/Calcipotriene cream to the right ear, nasal tip, and right forearm. Areas are clear today.  SEBORRHEIC KERATOSIS - Stuck-on, waxy, tan-brown papules and/or plaques  - Benign-appearing - Discussed benign etiology and prognosis. - Observe - Call for any changes  LENTIGINES Exam: scattered tan macules Due to sun exposure Treatment Plan: Benign-appearing, observe. Recommend daily broad spectrum sunscreen SPF 30+ to sun-exposed areas, reapply every 2 hours as needed.  Call for any changes  HISTORY OF PRECANCEROUS ACTINIC KERATOSIS - site(s) of PreCancerous Actinic Keratosis clear today. - these may recur and new lesions may form requiring treatment to prevent transformation into skin cancer - observe for new or changing spots and contact [facility] for appointment if occur - photoprotection with sun protective clothing; sunglasses and broad spectrum sunscreen with SPF of at least 30 + and frequent self skin exams recommended - yearly exams by a dermatologist recommended for persons with history of PreCancerous Actinic Keratoses   Return in about 1 year (around 01/04/2025) for TBSE, Hx AKs.  IAndrea Kerns, CMA, am acting as scribe for Rexene Rattler, MD .   Documentation: I have reviewed the above documentation for accuracy and completeness, and I agree with the above.  Rexene Rattler, MD

## 2024-01-05 NOTE — ED Triage Notes (Signed)
 Patient to Urgent Care with complaints of right sided ear discomfort. Describes feeling like she has cotton in her ear and hears a fan blowing. Headaches.   Symptoms x2 weeks. Today having the sem symptoms in her left ear.

## 2024-02-02 ENCOUNTER — Ambulatory Visit: Admitting: Family Medicine

## 2024-02-15 ENCOUNTER — Other Ambulatory Visit: Payer: Self-pay | Admitting: Family Medicine

## 2024-02-16 ENCOUNTER — Telehealth: Payer: Self-pay | Admitting: Family Medicine

## 2024-02-16 DIAGNOSIS — J302 Other seasonal allergic rhinitis: Secondary | ICD-10-CM

## 2024-02-16 MED ORDER — MONTELUKAST SODIUM 10 MG PO TABS: 10.0000 mg | ORAL_TABLET | Freq: Every day | ORAL | 3 refills | Status: AC

## 2024-02-16 NOTE — Telephone Encounter (Signed)
[facility] faxed refill request for the following medications:   montelukast (SINGULAIR) 10 MG tablet   Please advise.  

## 2024-03-22 ENCOUNTER — Ambulatory Visit: Admitting: Family Medicine

## 2024-03-22 ENCOUNTER — Encounter: Payer: Self-pay | Admitting: Family Medicine

## 2024-03-22 VITALS — BP 129/85 | HR 76 | Ht 61.0 in | Wt 150.7 lb

## 2024-03-22 DIAGNOSIS — R7303 Prediabetes: Secondary | ICD-10-CM

## 2024-03-22 DIAGNOSIS — E782 Mixed hyperlipidemia: Secondary | ICD-10-CM

## 2024-03-22 DIAGNOSIS — I10 Essential (primary) hypertension: Secondary | ICD-10-CM

## 2024-03-22 DIAGNOSIS — D696 Thrombocytopenia, unspecified: Secondary | ICD-10-CM

## 2024-03-22 MED ORDER — AMOXICILLIN-POT CLAVULANATE 875-125 MG PO TABS: 1.0000 | ORAL_TABLET | Freq: Two times a day (BID) | ORAL | 0 refills | Status: AC

## 2024-03-22 MED ORDER — LISINOPRIL-HYDROCHLOROTHIAZIDE 20-25 MG PO TABS: 0.5000 | ORAL_TABLET | Freq: Every day | ORAL | 3 refills | Status: AC

## 2024-03-22 NOTE — Assessment & Plan Note (Signed)
 Platelet levels to be monitored as part of routine lab work. - Ordered blood counts as part of lab work

## 2024-03-22 NOTE — Assessment & Plan Note (Signed)
 Cholesterol levels were previously checked and were within normal limits. Decision made to check cholesterol levels again as part of the comprehensive lab work. - Ordered comprehensive lab work including cholesterol levels

## 2024-03-22 NOTE — Assessment & Plan Note (Signed)
 Recommend low carb diet Recheck A1c

## 2024-03-22 NOTE — Progress Notes (Signed)
 Established patient visit   Patient: Alyssa Thornton   DOB: 11-Mar-1956   68 y.o. Female  MRN: [id number] Visit Date: 03/22/2024  Today's healthcare provider: Jon Eva, MD   Chief Complaint  Patient presents with   Medical Management of Chronic Issues   Hypertension   Subjective    Hypertension     Discussed the use of AI scribe software for clinical note transcription with the patient, who gave verbal consent to proceed.  History of Present Illness   Alyssa Thornton is a 68 year old female who presents with sinus issues.  She reports a 2-week history of sinus symptoms starting after dust exposure while putting up Christmas decorations, with a persistent stuffy head sensation. Mucinex provided brief relief but symptoms recurred. She uses a maintenance nasal spray once daily.  Her current daily medication is lisinopril -HCTZ 20-25 mg.  She has thrombocytopenia.       Medications: Show/hide medication list[1]  Review of Systems     Objective    BP 129/85   Pulse 76   Ht 5' 1 (1.549 m)   Wt 150 lb 11.2 oz (68.4 kg)   SpO2 100%   BMI 28.47 kg/m    Physical Exam Vitals reviewed.  Constitutional:      General: She is not in acute distress.    Appearance: Normal appearance. She is well-developed. She is not diaphoretic.  HENT:     Head: Normocephalic and atraumatic.  Eyes:     General: No scleral icterus.    Conjunctiva/sclera: Conjunctivae normal.  Neck:     Thyroid: No thyromegaly.  Cardiovascular:     Rate and Rhythm: Normal rate and regular rhythm.     Heart sounds: Normal heart sounds. No murmur heard. Pulmonary:     Effort: Pulmonary effort is normal. No respiratory distress.     Breath sounds: Normal breath sounds. No wheezing, rhonchi or rales.  Musculoskeletal:     Cervical back: Neck supple.     Right lower leg: No edema.     Left lower leg: No edema.  Lymphadenopathy:     Cervical: No cervical adenopathy.  Skin:     General: Skin is warm and dry.     Findings: No rash.  Neurological:     Mental Status: She is alert and oriented to person, place, and time. Mental status is at baseline.  Psychiatric:        Mood and Affect: Mood normal.        Behavior: Behavior normal.      No results found for any visits on 03/22/24.  Assessment & Plan     Problem List Items Addressed This Visit       Cardiovascular and Mediastinum   Essential hypertension - Primary   Blood pressure is well controlled with current medication regimen. - Continue lisinopril -HCTZ 20-25 mg daily - Ensured refills are available as needed      Relevant Medications   lisinopril -hydrochlorothiazide  (ZESTORETIC ) 20-25 MG tablet   Other Relevant Orders   Comprehensive metabolic panel with GFR     Hematopoietic and Hemostatic   Thrombocytopenia   Platelet levels to be monitored as part of routine lab work. - Ordered blood counts as part of lab work       Relevant Orders   CBC w/Diff/Platelet     Other   Hyperlipidemia   Cholesterol levels were previously checked and were within normal limits. Decision made to check cholesterol levels again  as part of the comprehensive lab work. - Ordered comprehensive lab work including cholesterol levels      Relevant Medications   lisinopril -hydrochlorothiazide  (ZESTORETIC ) 20-25 MG tablet   Other Relevant Orders   Comprehensive metabolic panel with GFR   Lipid panel   Prediabetes   Recommend low carb diet Recheck A1c       Relevant Orders   Hemoglobin A1c        Acute sinusitis Likely due to dust exposure while setting up Christmas decorations. Symptoms have persisted for two weeks with initial improvement using Mucinex, but symptoms recurred. Augmentin  is considered appropriate for treatment. - Prescribed Augmentin  twice daily for one week      Return in about 6 months (around 09/20/2024) for chronic disease f/u.       Jon Eva, MD  [facility]  Family Practice [phone] (phone) [phone] (fax)  [facility]     [1]  Outpatient Medications Prior to Visit  Medication Sig   Cholecalciferol (VITAMIN D-3 PO) Take 1,000 Int'l Units by mouth in the morning and at bedtime. 2 tabs daily with a meal   Efinaconazole  (JUBLIA ) 10 % SOLN Apply to the toenails QHS.   fluorouracil  (EFUDEX ) 5 % cream Apply to the nose, ears, and R forearm BID x 7 days.   fluticasone  (FLONASE ) 50 MCG/ACT nasal spray Place into both nostrils daily.   montelukast  (SINGULAIR ) 10 MG tablet Take 1 tablet (10 mg total) by mouth at bedtime.   Omega-3 Fatty Acids (FISH OIL PO) Take by mouth.   vitamin C (ASCORBIC ACID) 500 MG tablet Take 500 mg by mouth daily.   [DISCONTINUED] lisinopril -hydrochlorothiazide  (ZESTORETIC ) 20-25 MG tablet TAKE 1/2 TABLET BY MOUTH DAILY   No facility-administered medications prior to visit.

## 2024-03-22 NOTE — Assessment & Plan Note (Signed)
 Blood pressure is well controlled with current medication regimen. - Continue lisinopril -HCTZ 20-25 mg daily - Ensured refills are available as needed

## 2024-03-23 ENCOUNTER — Ambulatory Visit: Payer: Self-pay | Admitting: Family Medicine

## 2024-03-23 LAB — LIPID PANEL
Chol/HDL Ratio: 3 ratio (ref 0.0–4.4)
Cholesterol, Total: 181 mg/dL (ref 100–199)
HDL: 61 mg/dL (ref >39)
LDL Chol Calc (NIH): 100 mg/dL — ABNORMAL HIGH (ref 0–99)
Triglycerides: 115 mg/dL (ref 0–149)
VLDL Cholesterol Cal: 20 mg/dL (ref 5–40)

## 2024-03-23 LAB — CBC WITH DIFFERENTIAL/PLATELET
Basophils Absolute: 0 x10E3/uL (ref 0.0–0.2)
Basos: 1 %
EOS (ABSOLUTE): 0.1 x10E3/uL (ref 0.0–0.4)
Eos: 1 %
Hematocrit: 45.5 % (ref 34.0–46.6)
Hemoglobin: 15.3 g/dL (ref 11.1–15.9)
Immature Grans (Abs): 0 x10E3/uL (ref 0.0–0.1)
Immature Granulocytes: 0 %
Lymphocytes Absolute: 2.3 x10E3/uL (ref 0.7–3.1)
Lymphs: 38 %
MCH: 31.2 pg (ref 26.6–33.0)
MCHC: 33.6 g/dL (ref 31.5–35.7)
MCV: 93 fL (ref 79–97)
Monocytes Absolute: 0.4 x10E3/uL (ref 0.1–0.9)
Monocytes: 7 %
Neutrophils Absolute: 3.1 x10E3/uL (ref 1.4–7.0)
Neutrophils: 53 %
Platelets: 198 x10E3/uL (ref 150–450)
RBC: 4.91 x10E6/uL (ref 3.77–5.28)
RDW: 11.9 % (ref 11.7–15.4)
WBC: 5.9 x10E3/uL (ref 3.4–10.8)

## 2024-03-23 LAB — COMPREHENSIVE METABOLIC PANEL WITH GFR
ALT: 52 IU/L — ABNORMAL HIGH (ref 0–32)
AST: 33 IU/L (ref 0–40)
Albumin: 4.5 g/dL (ref 3.9–4.9)
Alkaline Phosphatase: 116 IU/L (ref 49–135)
BUN/Creatinine Ratio: 17 (ref 12–28)
BUN: 13 mg/dL (ref 8–27)
Bilirubin Total: 0.9 mg/dL (ref 0.0–1.2)
CO2: 22 mmol/L (ref 20–29)
Calcium: 9.8 mg/dL (ref 8.7–10.3)
Chloride: 101 mmol/L (ref 96–106)
Creatinine, Ser: 0.77 mg/dL (ref 0.57–1.00)
Globulin, Total: 2.2 g/dL (ref 1.5–4.5)
Glucose: 104 mg/dL — ABNORMAL HIGH (ref 70–99)
Potassium: 4.2 mmol/L (ref 3.5–5.2)
Sodium: 142 mmol/L (ref 134–144)
Total Protein: 6.7 g/dL (ref 6.0–8.5)
eGFR: 83 mL/min/1.73 (ref >59)

## 2024-03-23 LAB — HEMOGLOBIN A1C
Est. average glucose Bld gHb Est-mCnc: 126 mg/dL
Hgb A1c MFr Bld: 6 % — ABNORMAL HIGH (ref 4.8–5.6)

## 2024-09-21 ENCOUNTER — Ambulatory Visit: Admitting: Family Medicine

## 2025-01-11 ENCOUNTER — Encounter: Admitting: Dermatology
